# Patient Record
Sex: Male | Born: 1965 | Race: White | Hispanic: No
Health system: Midwestern US, Academic
[De-identification: ages and names within clinical notes are randomized; demographics above are authoritative.]

---

## 2017-04-28 ENCOUNTER — Ambulatory Visit: Admit: 2017-04-28 | Discharge: 2017-04-28 | Payer: Private Health Insurance - Indemnity

## 2017-04-28 ENCOUNTER — Encounter: Admit: 2017-04-28 | Discharge: 2017-04-28 | Payer: No Typology Code available for payment source

## 2017-04-28 DIAGNOSIS — Q613 Polycystic kidney, unspecified: Principal | ICD-10-CM

## 2017-04-28 DIAGNOSIS — N183 Chronic kidney disease, stage 3 (moderate): ICD-10-CM

## 2017-04-28 DIAGNOSIS — I151 Hypertension secondary to other renal disorders: ICD-10-CM

## 2017-04-28 LAB — COMPREHENSIVE METABOLIC PANEL
Lab: 0.5 mg/dL (ref 0.3–1.2)
Lab: 1.5 mg/dL — ABNORMAL HIGH (ref 0.4–1.24)
Lab: 10 mg/dL (ref 8.5–10.6)
Lab: 140 MMOL/L (ref 137–147)
Lab: 19 U/L (ref 7–40)
Lab: 19 U/L (ref 7–56)
Lab: 19 mg/dL (ref 7–25)
Lab: 23 MMOL/L (ref 21–30)
Lab: 4.3 g/dL (ref 3.5–5.0)
Lab: 4.4 MMOL/L (ref 3.5–5.1)
Lab: 49 U/L (ref 25–110)
Lab: 49 mL/min — ABNORMAL LOW (ref >60)
Lab: 59 mL/min — ABNORMAL LOW (ref >60)
Lab: 6 (ref 3–12)
Lab: 7.1 g/dL (ref 6.0–8.0)
Lab: 91 mg/dL (ref 70–100)

## 2017-04-28 LAB — PARATHYROID HORMONE: Lab: 137 pg/mL — ABNORMAL HIGH (ref 10–65)

## 2017-04-28 LAB — HEMOGLOBIN & HEMATOCRIT
Lab: 14 g/dL (ref 13.5–16.5)
Lab: 44 % (ref 40–50)

## 2017-04-28 LAB — PHOSPHORUS: Lab: 3.1 mg/dL (ref 2.0–4.0)

## 2017-04-28 NOTE — Progress Notes
[facility]  Progress Note    History       Name:  Matthew Paul                                             MRN:  [id number]   History of Present Illness: Matthew Paul is a 51 y.o. male here for evaluation of polycystic kidney disease.  Diagnosed 7 years ago by MRI He has been our tolvaptan clinical trial on 60/30 dose.  He was seeing Dr. Darene Lamer and would like to transfer his care to [place] and continue on tolvaptan. He has been in tolvaptan since 2015 and Cr has been stable around 1.3-1.5.      History of PKD complications:  ? History of hypertension: yes   ? History of UTI: no   ? History of kidney stones: no     ? History of liver cysts: no   ? History of subarachnoid hemorrhage: no          Past Medical and Surgical History  HTN   PKD   HLD   GERD     Review of Systems  Current symptoms of PKD:  ? Flank pain:  No   ? Back pain:   Some lower back pain   ? Abdominal pain: no   ? Macroscopic hematuria: no     ? Dysuria:   No     10 point ROS was obtained and otherwise negative except as mentioned above.    Medications  HOME MEDS  ??? ascorbic acid(+) (VITAMIN C) 1,000 mg tablet Take 1 tablet by mouth daily.   ??? calcium carbonate (CALCIUM 600 PO) Take 1 tablet by mouth daily.   ??? cholecalciferol (VITAMIN D-3) 400 unit tab tablet Take 400 Units by mouth daily.   ??? fexofenadine HCl (ALLEGRA PO) Take 1 tablet by mouth daily.   ??? FLAXSEED OIL PO Take 1,200 mg by mouth daily.   ??? lisinopril (PRINIVIL; ZESTRIL) 20 mg tablet Take 1 tablet by mouth daily.   ??? Magnesium 250 mg tab Take 1 tablet by mouth daily.   ??? pantoprazole DR (PROTONIX) 40 mg tablet Take 1 tablet by mouth daily.   ??? simvastatin (ZOCOR) 20 mg tablet Take 1 tablet by mouth daily.   ??? vitamin E 400 unit capsule Take 400 Units by mouth daily.   ??? vitamins, B complex tab Take 2 tablets by mouth daily.        Allergies    Seasonal allergies    Family History  ??? Family history of ADPKD, how it was diagnosed, age of onset of CKD/ESRD: Dad's twin sister and daughter   ??? Family history of brain aneurysm or cerebral hemorrhage: none       No family history on file.    Social History  ??? Amount of coffee intake (cups per day): none   ??? Smoking:  None   ??? Amount of tea intake (cups per day): none   ??? Amount of caffeinated soda intake (12 oz. cans per day): 3-4 cans/ week   ??? Average number of alcoholic drinks per day (1 drink = 1 glass of wine, 1 can of beer or 1 shot of spirits):  3-4 a week     Social History     Social History   ??? Marital status: Divorced  Spouse name: N/A   ??? Number of children: N/A   ??? Years of education: N/A     Social History Main Topics   ??? Smoking status: Never Smoker   ??? Smokeless tobacco: Never Used   ??? Alcohol use Not on file   ??? Drug use: Unknown   ??? Sexual activity: Not on file     Other Topics Concern   ??? Not on file     Social History Narrative   ??? No narrative on file            Physical Exam      Vitals:    04/28/17 1529 04/28/17 1530   BP: 116/79 108/78   Pulse: 82 96   Resp: 16    Temp: 36.8 ???C (98.3 ???F)    TempSrc: Oral    Weight: 131.3 kg (289 lb 6.4 oz)    Height: 200.7 cm (79)      Body mass index is 32.6 kg/m???.    Gen: Alert and Oriented, No Acute Distress   HEENT: Sclera normal, no cervical lymphadenopathy   CV:  S1 and S2 normal, no rubs, murmurs or gallops   Pulm: Clear to Auscultation bilateral   GI: BS+ x4, non-tender to palpation  ? Kidneys palpable: yes/non-tender   Neuro: Grossly normal, moving all extremities, speech intact  Ext: no edema, clubbing, or cyanosis  Skin: no rash       Labs     Comprehensive Metabolic Profile  CMP Latest Ref Rng & Units 04/28/2017 06/29/2013   NA 137 - 147 MMOL/L 140 140   K 3.5 - 5.1 MMOL/L 4.4 4.3   CL 98 - 110 MMOL/L 111(H) 108   CO2 21 - 30 MMOL/L 23 23   GAP 3 - 12 6 9    BUN 7 - 25 MG/DL 19 23   CR 0.4 - 1.61 MG/DL 0.96(E) 4.54(U)   GLUX 70 - 100 MG/DL 91 88   CA 8.5 - 98.1 MG/DL 19.1 47.8   TP 6.0 - 8.0 G/DL 7.1 -   ALB 3.5 - 5.0 G/DL 4.3 - ALKP 25 - 295 U/L 49 -   ALT 7 - 56 U/L 19 -   TBILI 0.3 - 1.2 MG/DL 0.5 -   GFR >62 mL/min 49(L) >60   GFRAA >60 mL/min 59(L) >60         Assessment and Plan        Matthew Paul is a 51 y.o. male     ADPKD with CKD 3  - ADPKD diagnosis is confirmed:  ? Approximate date of initial diagnosis: 2011  ? 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 Confirmed by imaging: MRI 2014  - will get CMP and plan to continue tolvaptan at 60mg /30mg  once he is done with the study   - baseline Cr 1.3-1.5   - discussed risk and benefit of tolvatpan with patient and the REMS program that he has to enroll in to get the medication     HTN  - controlled continue current regimen     Anemia   - hb at goal     Mineral bone disease   - PTH, phos and vitamin D at goal 04/2017    [name], MD 05/04/2017 1:27 PM  Pager         Patient Instructions   Please do not hesitate to call with any questions.  My nurse Dahlia Client can be reached [phone].  Please go to the lab on your way out     Return to clinic in 3 months       Jynarque (Tolvaptan)   Risk   There is risk of serious and potentially fatal liver injury associated with JYNARQUE and that blood testing and monitoring is required to take this medication.  Only 0.2% of patients in clinical trials had increase in their liver enzymes     There is risk of hypernatremia or elevated sodium in your blood if Paul did not drink enough water. Please see fluid intake instructions     Blood work Instructions   ??? Get a blood test at 2 weeks and 4 weeks after Paul start treatment.   ??? Get a blood test every month for the first 18 months of treatment and then every 3 months     Fluid Intake Instructions   Please drink enough fluid to prevent excessive thirst throughout the daytime period and an additional 1-2 cups of water before bedtime with additional replenishment with each episode of nighttime urination to prevent dehydration Please call if Paul developed fatigue, anorexia, nausea, right upper abdominal discomfort, vomiting, fever, rash, pruritus, icterus, dark urine, or jaundice. These could be signs of liver injury     To start the medication Paul will need to complete the patient enrollment form

## 2017-04-29 LAB — 25-OH VITAMIN D (D2 + D3): Lab: 48 ng/mL — ABNORMAL HIGH (ref 30–80)

## 2017-05-04 ENCOUNTER — Encounter: Admit: 2017-05-04 | Discharge: 2017-05-04 | Payer: No Typology Code available for payment source

## 2017-05-04 NOTE — Telephone Encounter
-----   Message from Earlie Raveling, MD sent at 05/03/2017 12:40 PM CDT -----  Please let him know his liver function tests are normal sent in the REMS for him to get tolvaptan once he has filled out the my pass I will send the script into the pharmacy   Thanks  [facility]

## 2017-05-04 NOTE — Telephone Encounter
Called pt.   LVM to call back.

## 2017-05-10 ENCOUNTER — Encounter: Admit: 2017-05-10 | Discharge: 2017-05-10 | Payer: No Typology Code available for payment source

## 2017-05-10 MED ORDER — TOLVAPTAN 60 MG (AM)/ 30 MG (PM) PO TBSQ
60 mg | ORAL_TABLET | Freq: Two times a day (BID) | ORAL | 11 refills | Status: AC
Start: 2017-05-10 — End: 2018-06-06

## 2017-05-11 ENCOUNTER — Encounter: Admit: 2017-05-11 | Discharge: 2017-05-11 | Payer: No Typology Code available for payment source

## 2017-05-11 NOTE — Progress Notes
[facility] called, they are needing OV note and labs in order to initiate prior authorization. Information faxed to [phone].

## 2017-05-19 NOTE — Telephone Encounter
Pt aware of below, REMs completed and faxed. No further action needed.

## 2017-05-25 ENCOUNTER — Encounter: Admit: 2017-05-25 | Discharge: 2017-05-25 | Payer: No Typology Code available for payment source

## 2017-05-25 DIAGNOSIS — Q613 Polycystic kidney, unspecified: Principal | ICD-10-CM

## 2017-05-25 NOTE — Telephone Encounter
Per Dr. Cephas Darby, pt to have labs Q23mo for [facility] drug monitoring. Standing order for BMP placed and mailed along with [facility] form for LFTs.

## 2017-06-04 LAB — COMPREHENSIVE METABOLIC PANEL
Lab: 15
Lab: 15
Lab: 57

## 2017-07-05 ENCOUNTER — Encounter: Admit: 2017-07-05 | Discharge: 2017-07-05 | Payer: No Typology Code available for payment source

## 2017-07-05 DIAGNOSIS — Q613 Polycystic kidney, unspecified: Principal | ICD-10-CM

## 2017-07-08 ENCOUNTER — Encounter: Admit: 2017-07-08 | Discharge: 2017-07-08 | Payer: No Typology Code available for payment source

## 2017-07-08 NOTE — Telephone Encounter
Nurse attempted to call patient to discuss his lab results however no one answered.    Nurse will try again later on,   Carlos American, LPN

## 2017-07-08 NOTE — Telephone Encounter
Nurse spoke with patient and informed him that Dr. Cephas Darby stated, "Please let him know his liver function test are normal; can continue on tolvaptan."    Patient verbalized understanding and had no questions or concerns.   Matthew American, LPN

## 2017-08-04 ENCOUNTER — Ambulatory Visit: Admit: 2017-08-04 | Discharge: 2017-08-04 | Payer: Private Health Insurance - Indemnity

## 2017-08-04 ENCOUNTER — Encounter: Admit: 2017-08-04 | Discharge: 2017-08-04 | Payer: No Typology Code available for payment source

## 2017-08-04 DIAGNOSIS — I151 Hypertension secondary to other renal disorders: ICD-10-CM

## 2017-08-04 DIAGNOSIS — N183 Chronic kidney disease, stage 3 (moderate): Principal | ICD-10-CM

## 2017-08-04 DIAGNOSIS — Q613 Polycystic kidney, unspecified: ICD-10-CM

## 2017-08-04 NOTE — Progress Notes
History          CC: PKD with CKD     HPI: Matthew Paul is a 51 y.o. male here for follow up on his PKD.  He continues on tolvaptan 60mg /30mg  that he was on in the study and doing well with that dose.  He denies any chest pain, nausea, vomiting, diarrhea, problems breathing or edema.      Medication    ??? ascorbic acid(+) (VITAMIN C) 1,000 mg tablet Take 1 tablet by mouth daily.   ??? calcium carbonate (CALCIUM 600 PO) Take 1 tablet by mouth daily.   ??? cholecalciferol (VITAMIN D-3) 400 unit tab tablet Take 400 Units by mouth daily.   ??? fexofenadine HCl (ALLEGRA PO) Take 1 tablet by mouth daily.   ??? FLAXSEED OIL PO Take 1,200 mg by mouth daily.   ??? lisinopril (PRINIVIL; ZESTRIL) 20 mg tablet Take 1 tablet by mouth daily.   ??? Magnesium 250 mg tab Take 1 tablet by mouth daily.   ??? pantoprazole DR (PROTONIX) 40 mg tablet Take 1 tablet by mouth daily.   ??? simvastatin (ZOCOR) 20 mg tablet Take 1 tablet by mouth daily.   ??? tolvaptan 60 mg (AM)/ 30 mg (PM) TbSQ Take 60 mg by mouth twice daily. Please take 60mg  every morning and 30 mg in the evening. Disregard 60 BID dosing instructions   ??? vitamin E 400 unit capsule Take 400 Units by mouth daily.   ??? vitamins, B complex tab Take 2 tablets by mouth daily.          Review of Systems  Constitutional: negative  Eyes: negative  Ears, nose, mouth, throat, and face: negative  Respiratory: negative  Cardiovascular: negative  Gastrointestinal: negative  Genitourinary:negative  Integument/breast: negative  Hematologic/lymphatic: negative  Musculoskeletal:negative  Neurological: negative  Endocrine: negative      Physical        Vitals:    08/04/17 1536 08/04/17 1538   BP: 128/89 118/89   Pulse: 85 101   Weight: 135.6 kg (299 lb)    Height: 200.7 cm (79)      Body mass index is 33.68 kg/m???.    Gen: Alert and Oriented, No Acute Distress   HEENT: Sclera normal; MMM    CV: no JVD, S1 and S2 normal, no rubs, murmurs or gallops   Pulm: Clear to Auscultation bilateral GI: BS+ x4, non-tender to palpation  Neuro: Grossly normal, moving all extremities, speech intact  Ext: no edema, clubbing or cyanosis   Skin: no rash      Assessment and Plan         Matthew Paul is a 51 y.o. 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 Approximate date of initial diagnosis: 2011  ? 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 Confirmed by imaging: MRI 2014  - continue tolvaptan at 60mg /30mg   - baseline Cr 1.3-1.5   - doing well on tolvaptan due for 3 months labs in November, will continue to monitor LFTs and for side effects     HTN  - controlled continue current regimen     Anemia   - hb at goal     Mineral bone disease   - PTH, phos and vitamin D at goal 04/2017          [name], MD   Pager [phone]  Patient Instructions  Please do not hesitate to call with any questions.  My nurse Jarrett Soho can be reached [phone].     Please go to the lab every 3 months     Return to clinic in 6 months

## 2017-08-08 ENCOUNTER — Encounter: Admit: 2017-08-08 | Discharge: 2017-08-08 | Payer: No Typology Code available for payment source

## 2017-09-12 ENCOUNTER — Encounter: Admit: 2017-09-12 | Discharge: 2017-09-12 | Payer: No Typology Code available for payment source

## 2018-01-19 ENCOUNTER — Encounter: Admit: 2018-01-19 | Discharge: 2018-01-19 | Payer: No Typology Code available for payment source

## 2018-01-20 LAB — BASIC METABOLIC PANEL
Lab: 1.4 — ABNORMAL HIGH
Lab: 106
Lab: 143
Lab: 22
Lab: 23
Lab: 4.7
Lab: 53 — ABNORMAL LOW
Lab: 92

## 2018-01-20 LAB — LIVER FUNCTION PANEL
Lab: 0.4
Lab: 14
Lab: 17
Lab: 61

## 2018-01-24 ENCOUNTER — Encounter: Admit: 2018-01-24 | Discharge: 2018-01-24 | Payer: No Typology Code available for payment source

## 2018-01-24 DIAGNOSIS — N183 Chronic kidney disease, stage 3 (moderate): Principal | ICD-10-CM

## 2018-03-16 ENCOUNTER — Encounter: Admit: 2018-03-16 | Discharge: 2018-03-16 | Payer: No Typology Code available for payment source

## 2018-03-16 ENCOUNTER — Ambulatory Visit: Admit: 2018-03-16 | Discharge: 2018-03-16 | Payer: Private Health Insurance - Indemnity

## 2018-03-16 DIAGNOSIS — N183 Chronic kidney disease, stage 3 (moderate): Principal | ICD-10-CM

## 2018-03-16 DIAGNOSIS — Q613 Polycystic kidney, unspecified: ICD-10-CM

## 2018-03-16 DIAGNOSIS — I151 Hypertension secondary to other renal disorders: ICD-10-CM

## 2018-03-20 ENCOUNTER — Encounter: Admit: 2018-03-20 | Discharge: 2018-03-20 | Payer: No Typology Code available for payment source

## 2018-05-29 ENCOUNTER — Encounter: Admit: 2018-05-29 | Discharge: 2018-05-29 | Payer: No Typology Code available for payment source

## 2018-06-01 MED ORDER — JYNARQUE 60 MG (AM)/ 30 MG (PM) PO TBSQ
ORAL_TABLET | 8 refills
Start: 2018-06-01 — End: ?

## 2018-06-05 ENCOUNTER — Encounter: Admit: 2018-06-05 | Discharge: 2018-06-05 | Payer: No Typology Code available for payment source

## 2018-06-06 ENCOUNTER — Encounter: Admit: 2018-06-06 | Discharge: 2018-06-06 | Payer: No Typology Code available for payment source

## 2018-06-06 MED ORDER — TOLVAPTAN 60 MG (AM)/ 30 MG (PM) PO TBSQ
60 mg | ORAL_TABLET | Freq: Two times a day (BID) | ORAL | 5 refills | 28.00000 days | Status: AC
Start: 2018-06-06 — End: 2018-11-22

## 2018-06-08 ENCOUNTER — Encounter: Admit: 2018-06-08 | Discharge: 2018-06-08 | Payer: No Typology Code available for payment source

## 2018-06-09 ENCOUNTER — Encounter: Admit: 2018-06-09 | Discharge: 2018-06-09 | Payer: No Typology Code available for payment source

## 2018-09-15 ENCOUNTER — Encounter: Admit: 2018-09-15 | Discharge: 2018-09-15 | Payer: No Typology Code available for payment source

## 2018-09-25 LAB — LIVER FUNCTION PANEL
Lab: 0.4
Lab: 16
Lab: 18
Lab: 64

## 2018-09-29 ENCOUNTER — Encounter: Admit: 2018-09-29 | Discharge: 2018-09-29 | Payer: No Typology Code available for payment source

## 2018-10-23 ENCOUNTER — Encounter: Admit: 2018-10-23 | Discharge: 2018-10-23 | Payer: 59

## 2018-10-24 ENCOUNTER — Encounter: Admit: 2018-10-24 | Discharge: 2018-10-24 | Payer: 59

## 2018-10-24 DIAGNOSIS — Q613 Polycystic kidney, unspecified: Secondary | ICD-10-CM

## 2018-10-24 DIAGNOSIS — N189 Chronic kidney disease, unspecified: Secondary | ICD-10-CM

## 2018-10-25 ENCOUNTER — Encounter: Admit: 2018-10-25 | Discharge: 2018-10-25 | Payer: 59

## 2018-10-26 ENCOUNTER — Ambulatory Visit: Admit: 2018-10-26 | Discharge: 2018-10-27 | Payer: No Typology Code available for payment source

## 2018-10-26 ENCOUNTER — Encounter: Admit: 2018-10-26 | Discharge: 2018-10-26 | Payer: 59

## 2018-10-26 ENCOUNTER — Ambulatory Visit: Admit: 2018-10-26 | Discharge: 2018-10-26

## 2018-10-26 DIAGNOSIS — N183 Chronic kidney disease, stage 3 (moderate): Secondary | ICD-10-CM

## 2018-10-26 DIAGNOSIS — Q613 Polycystic kidney, unspecified: Secondary | ICD-10-CM

## 2018-10-26 DIAGNOSIS — N189 Chronic kidney disease, unspecified: Principal | ICD-10-CM

## 2018-10-26 NOTE — Progress Notes
History          CC: PKD with CKD     HPI: Matthew Paul is a 53 y.o. male here for follow up on his PKD. He has been doing well since his last visit on Tovlaptan.  He had his cysts drain 2 years ago with good symptomatic improvement.  He noticed the shortness of breath and so he went back to [facility] in Sept 2019 for cysts drained.  They did not drain any of the large cysts and he is still having major symptoms.  He has worsening abdominal distention, shortness of breath and flank pain.       Medication    ? ascorbic acid(+) (VITAMIN C) 1,000 mg tablet Take 1 tablet by mouth daily.   ? calcium carbonate (CALCIUM 600 PO) Take 1 tablet by mouth daily.   ? cholecalciferol (VITAMIN D-3) 400 unit tab tablet Take 400 Units by mouth daily.   ? fexofenadine HCl (ALLEGRA PO) Take 1 tablet by mouth daily.   ? FLAXSEED OIL PO Take 1,200 mg by mouth daily.   ? lisinopril (PRINIVIL; ZESTRIL) 20 mg tablet Take 1 tablet by mouth daily.   ? Magnesium 250 mg tab Take 1 tablet by mouth daily.   ? pantoprazole DR (PROTONIX) 40 mg tablet Take 1 tablet by mouth daily.   ? simvastatin (ZOCOR) 20 mg tablet Take 1 tablet by mouth daily.   ? tolvaptan 60 mg (AM)/ 30 mg (PM) TbSQ Take 60 mg by mouth twice daily. Take 60mg  in the AM and 30mg  in the PM  Indications: Autosomal Dominant Polycystic Kidney Disease   ? vitamin E 400 unit capsule Take 400 Units by mouth daily.   ? vitamins, B complex tab Take 2 tablets by mouth daily.          Review of Systems  Constitutional: negative  Eyes: negative  Ears, nose, mouth, throat, and face: negative  Respiratory: shortness of breath   Cardiovascular: negative  Gastrointestinal:abdominal distention   Genitourinary:polyuria   Integument/breast: negative  Hematologic/lymphatic: negative  Musculoskeletal:negative  Neurological: negative  Endocrine: negative      Physical        Vitals:    10/26/18 1301 10/26/18 1307   BP: 133/85 111/79   Pulse: 97 118   Temp: 36.6 ?C (97.9 ?F)    TempSrc: Oral Weight: 132.4 kg (291 lb 12.8 oz)    Height: 200.7 cm (79)      Body mass index is 32.87 kg/m???.    Gen: Alert and Oriented, No Acute Distress   HEENT: Sclera normal; MMM    CV: no JVD, S1 and S2 normal, no rubs, murmurs or gallops   Pulm: Clear to Auscultation bilateral   GI: BS+ x4, non-tender to palpation  Neuro: Grossly normal, moving all extremities, speech intact  Ext: no edema, clubbing or cyanosis   Skin: no rash      Assessment and Plan         Matthew Paul is a 53 y.o. 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 Approximate date of initial diagnosis: 2011  ? 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 Confirmed by imaging: MRI 2014  - continue tolvaptan at 60mg /30mg   - baseline Cr 1.5   - Cr on labs done today is up slightly to 1.8   - doing well on tolvaptan dose, will  continue to monitor LFTs and for side effects   - arrange for IR to do cyst aspiration here     HTN  - controlled continue current regimen     Anemia   - hb at goal     Mineral bone disease   - PTH, phos and vitamin D at goal 04/2017    RTC 6 months   [name], MD   Pager [phone]

## 2018-10-27 DIAGNOSIS — N183 Chronic kidney disease, stage 3 (moderate): Secondary | ICD-10-CM

## 2018-10-31 ENCOUNTER — Encounter: Admit: 2018-10-31 | Discharge: 2018-10-31 | Payer: 59

## 2018-11-01 ENCOUNTER — Ambulatory Visit: Admit: 2018-11-01 | Discharge: 2018-11-01 | Payer: No Typology Code available for payment source

## 2018-11-01 ENCOUNTER — Encounter: Admit: 2018-11-01 | Discharge: 2018-11-01 | Payer: 59

## 2018-11-01 DIAGNOSIS — N183 Chronic kidney disease, stage 3 (moderate): Secondary | ICD-10-CM

## 2018-11-01 DIAGNOSIS — Q612 Polycystic kidney, adult type: Secondary | ICD-10-CM

## 2018-11-01 DIAGNOSIS — N189 Chronic kidney disease, unspecified: ICD-10-CM

## 2018-11-01 MED ORDER — FENTANYL CITRATE (PF) 50 MCG/ML IJ SOLN
25-50 ug | Freq: Once | INTRAVENOUS | 0 refills | Status: CP
Start: 2018-11-01 — End: ?
  Administered 2018-11-01: 18:00:00 50 ug via INTRAVENOUS

## 2018-11-01 MED ORDER — MIDAZOLAM 1 MG/ML IJ SOLN
0 refills | Status: CP
Start: 2018-11-01 — End: ?
  Administered 2018-11-01: 19:00:00 1 mg via INTRAVENOUS

## 2018-11-01 MED ORDER — FENTANYL CITRATE (PF) 50 MCG/ML IJ SOLN
0 refills | Status: CP
Start: 2018-11-01 — End: ?
  Administered 2018-11-01 (×2): 50 ug via INTRAVENOUS

## 2018-11-01 MED ORDER — MIDAZOLAM 1 MG/ML IJ SOLN
1 mg | Freq: Once | INTRAVENOUS | 0 refills | Status: CP
Start: 2018-11-01 — End: ?
  Administered 2018-11-01: 18:00:00 1 mg via INTRAVENOUS

## 2018-11-03 ENCOUNTER — Encounter: Admit: 2018-11-03 | Discharge: 2018-11-03 | Payer: 59

## 2018-11-03 DIAGNOSIS — Q613 Polycystic kidney, unspecified: Secondary | ICD-10-CM

## 2018-11-07 MED ORDER — FENTANYL CITRATE (PF) 50 MCG/ML IJ SOLN
25-50 ug | Freq: Once | INTRAVENOUS | 0 refills | Status: DC
Start: 2018-11-07 — End: 2018-11-07

## 2018-11-07 MED ORDER — SODIUM CHLORIDE 0.9 % IV SOLP
1000 mL | Freq: Once | INTRAVENOUS | 0 refills | Status: DC
Start: 2018-11-07 — End: 2018-11-07

## 2018-11-07 MED ORDER — FLUMAZENIL 0.1 MG/ML IV SOLN
.2 mg | INTRAVENOUS | 0 refills | Status: DC | PRN
Start: 2018-11-07 — End: 2018-11-07

## 2018-11-07 MED ORDER — MIDAZOLAM 1 MG/ML IJ SOLN
1 mg | Freq: Once | INTRAVENOUS | 0 refills | Status: DC
Start: 2018-11-07 — End: 2018-11-07

## 2018-11-07 MED ORDER — NALOXONE 0.4 MG/ML IJ SOLN
.08 mg | INTRAVENOUS | 0 refills | Status: DC | PRN
Start: 2018-11-07 — End: 2018-11-07

## 2018-11-08 ENCOUNTER — Ambulatory Visit: Admit: 2018-11-08 | Discharge: 2018-11-08 | Payer: No Typology Code available for payment source

## 2018-11-08 DIAGNOSIS — Q613 Polycystic kidney, unspecified: Secondary | ICD-10-CM

## 2018-11-08 LAB — BASIC METABOLIC PANEL
Lab: 109 MMOL/L — ABNORMAL LOW (ref 98–110)
Lab: 141 MMOL/L — ABNORMAL HIGH (ref 137–147)
Lab: 23 MMOL/L — ABNORMAL LOW (ref 21–30)
Lab: 4 MMOL/L — CL (ref 3.5–5.1)
Lab: 46 mL/min — ABNORMAL LOW (ref >60)
Lab: 56 mL/min — ABNORMAL LOW (ref >60)
Lab: 9 g/dL — ABNORMAL LOW (ref 3–12)
Lab: 9.8 mg/dL — ABNORMAL HIGH (ref 8.5–10.6)
Lab: 94 mg/dL — ABNORMAL HIGH (ref 70–100)

## 2018-11-08 MED ORDER — MIDAZOLAM 1 MG/ML IJ SOLN
1 mg | Freq: Once | INTRAVENOUS | 0 refills | Status: CP
Start: 2018-11-08 — End: ?
  Administered 2018-11-08: 18:00:00 1 mg via INTRAVENOUS

## 2018-11-08 MED ORDER — FENTANYL CITRATE (PF) 50 MCG/ML IJ SOLN
0 refills | Status: CP
Start: 2018-11-08 — End: ?
  Administered 2018-11-08 (×2): 50 ug via INTRAVENOUS

## 2018-11-08 MED ORDER — MIDAZOLAM 1 MG/ML IJ SOLN
0 refills | Status: CP
Start: 2018-11-08 — End: ?
  Administered 2018-11-08 (×2): 1 mg via INTRAVENOUS

## 2018-11-08 MED ORDER — FENTANYL CITRATE (PF) 50 MCG/ML IJ SOLN
50 ug | Freq: Once | INTRAVENOUS | 0 refills | Status: CP
Start: 2018-11-08 — End: ?
  Administered 2018-11-08: 18:00:00 50 ug via INTRAVENOUS

## 2018-11-22 ENCOUNTER — Encounter: Admit: 2018-11-22 | Discharge: 2018-11-22 | Payer: No Typology Code available for payment source

## 2018-11-22 MED ORDER — TOLVAPTAN 60 MG (AM)/ 30 MG (PM) PO TBSQ
60 mg | ORAL_TABLET | Freq: Two times a day (BID) | ORAL | 5 refills | 28.00000 days | Status: AC
Start: 2018-11-22 — End: 2019-05-29

## 2018-11-23 ENCOUNTER — Encounter: Admit: 2018-11-23 | Discharge: 2018-11-23 | Payer: No Typology Code available for payment source

## 2018-12-04 ENCOUNTER — Encounter: Admit: 2018-12-04 | Discharge: 2018-12-04 | Payer: No Typology Code available for payment source

## 2018-12-04 NOTE — Telephone Encounter
Pt called wanted to f/u on prior auth for Jynarque. Advised pt have rec'd approval for it via CoverMyMeds through his new plan. Pt states he just talked to his insurance and they reported they have not received anything. Advised pt will have to call and see what is going on as I do have an approval for this. Pt v/u, provided his member ID # [id number]. Advised will f/u with him once I clear this up with [employer], # is [phone].

## 2019-02-02 LAB — LIVER FUNCTION PANEL
Lab: 0.7
Lab: 20
Lab: 21
Lab: 66

## 2019-02-05 ENCOUNTER — Encounter: Admit: 2019-02-05 | Discharge: 2019-02-05 | Payer: No Typology Code available for payment source

## 2019-03-01 ENCOUNTER — Encounter: Admit: 2019-03-01 | Discharge: 2019-03-01 | Payer: No Typology Code available for payment source

## 2019-03-01 ENCOUNTER — Ambulatory Visit: Admit: 2019-03-01 | Discharge: 2019-03-01 | Payer: No Typology Code available for payment source

## 2019-03-01 DIAGNOSIS — Q613 Polycystic kidney, unspecified: Principal | ICD-10-CM

## 2019-03-01 NOTE — Progress Notes
Renal Clinic TeleHealth Note     Obtained patient's verbal consent to treat them and their agreement to [facility] financial policy and NPP via this telehealth visit during the Coronavirus Public Health Emergency      History      CC:     HPI: Matthew Paul is a 53 y.o. male  Seen in clinic for follow upon his PKD.  He had his cyst drained through IR and did not get as much relief has he has had in the past with the drainage.  He continues to have back pain and abdominal discomfort.  He denies any problems breathing, nausea, vomiting or diarrhea.        Past Medical History   PKD   HTN     Family History  Family history reviewed and non contributory     Social History  Social history reviewed and non contributory     Medication    HOME MEDS  ??? ascorbic acid(+) (VITAMIN C) 1,000 mg tablet Take 1 tablet by mouth daily.   ??? calcium carbonate (CALCIUM 600 PO) Take 1 tablet by mouth daily.   ??? cholecalciferol (VITAMIN D-3) 400 unit tab tablet Take 400 Units by mouth daily.   ??? fexofenadine HCl (ALLEGRA PO) Take 1 tablet by mouth daily.   ??? FLAXSEED OIL PO Take 1,200 mg by mouth daily.   ??? lisinopril (PRINIVIL; ZESTRIL) 20 mg tablet Take 1 tablet by mouth daily.   ??? Magnesium 250 mg tab Take 1 tablet by mouth daily.   ??? pantoprazole DR (PROTONIX) 40 mg tablet Take 1 tablet by mouth daily.   ??? simvastatin (ZOCOR) 20 mg tablet Take 1 tablet by mouth daily.   ??? tolvaptan 60 mg (AM)/ 30 mg (PM) TbSQ Take 60 mg by mouth twice daily. Take 60mg  in the AM and 30mg  in the PM  Indications: autosomal dominant polycystic kidney disease   ??? vitamin E 400 unit capsule Take 400 Units by mouth daily.   ??? vitamins, B complex tab Take 2 tablets by mouth daily.          Review of Systems  Constitutional: negative  Eyes: negative  Ears, nose, mouth, throat, and face: negative  Respiratory: negative  Cardiovascular: negative  Gastrointestinal: negative  Genitourinary:negative  Integument/breast: negative  Hematologic/lymphatic: negative

## 2019-03-06 ENCOUNTER — Encounter: Admit: 2019-03-06 | Discharge: 2019-03-06 | Payer: No Typology Code available for payment source

## 2019-03-13 ENCOUNTER — Ambulatory Visit: Admit: 2019-03-13 | Discharge: 2019-03-13

## 2019-03-13 ENCOUNTER — Encounter: Admit: 2019-03-13 | Discharge: 2019-03-13

## 2019-03-13 DIAGNOSIS — Q613 Polycystic kidney, unspecified: Secondary | ICD-10-CM

## 2019-03-13 NOTE — Telephone Encounter
Rec'd call from pt. Had MRI done today. Reports that prior authorization/pre approval was done through his old insurance provider that he hasn't had since 10/2018. His insurance info is updated and correct in [employer] and has been for awhile so unsure why was sent to different insurance. Advised will call his current insurance provider and complete prior auth for this. Pt v/u with no further questions or concerns.

## 2019-03-21 ENCOUNTER — Encounter: Admit: 2019-03-21 | Discharge: 2019-03-21

## 2019-03-21 NOTE — Telephone Encounter
Sent pt message via MyChart.

## 2019-03-21 NOTE — Telephone Encounter
-----   Message from Idolina Primer, MD sent at 03/21/2019  1:42 PM CDT -----  I am working on it.  Unfortunately a lot of his large cysts are not on the outside of his kidney and not easy to get to.  I am going to run his case by [employer] to see if he has any options   Thanks  Kerri   ----- Message -----  From: Syliva Overman, LPN  Sent: 05/18/8675   1:11 PM CDT  To: Idolina Primer, MD    Pt was wondering about Urologist? He said you were going to talk with someone about this MRI and abdominal fullness etc.

## 2019-04-06 ENCOUNTER — Encounter: Admit: 2019-04-06 | Discharge: 2019-04-06

## 2019-04-06 NOTE — Telephone Encounter
Nurse had been speaking with patients insurance company for the past 2 weeks to get them to pay for patients MRI.    Nurse spoke with someone from [employer] today who stated that the claim was paid on 6/18-2020.    The [employer] rep gave the nurse the check number which is, [id number], and the reference number for toadys call is [id number], in case anyone needed proof that claim was already paid.    Carlos American, RN

## 2019-04-09 ENCOUNTER — Encounter: Admit: 2019-04-09 | Discharge: 2019-04-09

## 2019-04-19 ENCOUNTER — Encounter: Admit: 2019-04-19 | Discharge: 2019-04-19

## 2019-04-19 DIAGNOSIS — Q613 Polycystic kidney, unspecified: Secondary | ICD-10-CM

## 2019-04-19 NOTE — Telephone Encounter
Dr. Dondra Prader reviewed pt MRI. If pt wanting evaluated can refer to Dr. Morene Crocker in Urology. Spoke with pt and discussed. Pt agreeable to referral. Pt had no further questions or concerns by end of call.

## 2019-04-30 ENCOUNTER — Encounter: Admit: 2019-04-30 | Discharge: 2019-04-30

## 2019-04-30 NOTE — Telephone Encounter
Nurse received a voicemail from patient wanting to know if the referral for Urology was done because he still had not received a call from them to schedule an appointment.     Nurse spoke with the Urology department and confirmed that they did received the referral but they haven't had a chance to schedule him yet due to being a little backed up.  She did state that they would be calling him either this week or next week.    Nurse spoke with patient and informed him of the statement above.    Patient verbalized understanding and had no further questions or concerns.    Carlos American, RN

## 2019-05-21 ENCOUNTER — Ambulatory Visit: Admit: 2019-05-21 | Discharge: 2019-05-22

## 2019-05-21 ENCOUNTER — Encounter: Admit: 2019-05-21 | Discharge: 2019-05-21

## 2019-05-21 DIAGNOSIS — Q612 Polycystic kidney, adult type: Secondary | ICD-10-CM

## 2019-05-21 DIAGNOSIS — Z1159 Encounter for screening for other viral diseases: Secondary | ICD-10-CM

## 2019-05-21 MED ORDER — CEFAZOLIN INJ 1GM IVP
2 g | Freq: Once | INTRAVENOUS | 0 refills | Status: CN
Start: 2019-05-21 — End: ?

## 2019-05-21 NOTE — Progress Notes
Date of Service: 05/21/2019     Subjective:             Matthew Paul is a 53 y.o. male who is referred by Dr. Catarina Hartshorn for possible cyst decortication of polycystic kidneys    History of Present Illness  Matthew Paul is a 53 y.o. male with a history of hypertension, anemia, paroxsymal a-fib (no AC) and ADPKD with CKD 3 who presents today for evaluation of a alternative cyst drainage.  He has been diagnosed with ADPKD for about 8 years. It comes from his paternal side as his aunt required dialysis and nephrectomies for this in the past. He is currently on Tolvaptan drug study, which is now approved by the FDA. Due to this medication, he has significant nocturia and urinary frequency. His creatinine 1.57. His symptoms include mild shortness or breath, unable to sleep on his back and abdominal fullness. He does have leg swelling late in the day. His abdomen is uncomfortabortabe, but not painful. He has undergone numerous cyst drainage in the past at [facility]. At one point, they drained 6 liters of fluid. Here at [facility] underwent successful IR aspiration and drainage of cyst on 11/01/2018 and 11/07/2018. He has completed approximately 6 cyst drainage's in the past which last typically about 2 years. Unfortunately, his most recent drainage did not relieve his symptoms for a long amount of time.     Cyst drainage at [facility]  11/01/2018: Successful aspiration of the dominant 4-5 cysts in the bilateral kidneys from the anterior approach  11/07/2018: Successful ultrasound-guided bilateral renal cyst aspiration. A total of 1 L of cyst fluid was aspirated.    He had an right inguinal hernia repair in 1985, no mesh. No GU malignancies. Never smoker. Former heavy drinking. Lives in [place]. Works as an Midwife.     History reviewed. No pertinent past medical history.    History reviewed. No pertinent surgical history.    History reviewed. No pertinent family history. Current Outpatient Medications   Medication Sig Dispense Refill   ??? ascorbic acid(+) (VITAMIN C) 1,000 mg tablet Take 1 tablet by mouth daily.     ??? atorvastatin (LIPITOR) 20 mg tablet Take 20 mg by mouth daily.     ??? calcium carbonate (CALCIUM 600 PO) Take 1 tablet by mouth daily.     ??? cholecalciferol (VITAMIN D-3) 400 unit tab tablet Take 400 Units by mouth daily.     ??? fexofenadine HCl (ALLEGRA PO) Take 1 tablet by mouth daily.     ??? FLAXSEED OIL PO Take 1,200 mg by mouth daily.     ??? lisinopril (PRINIVIL; ZESTRIL) 20 mg tablet Take 1 tablet by mouth daily.     ??? Magnesium 250 mg tab Take 1 tablet by mouth daily.     ??? omeprazole DR (PRILOSEC) 40 mg capsule Take 40 mg by mouth daily.     ??? tolvaptan 60 mg (AM)/ 30 mg (PM) TbSQ Take 60 mg by mouth twice daily. Take 60mg  in the AM and 30mg  in the PM  Indications: autosomal dominant polycystic kidney disease 60 tablet 5   ??? vitamin E 400 unit capsule Take 400 Units by mouth daily.     ??? vitamins, B complex tab Take 2 tablets by mouth daily.       No current facility-administered medications for this visit.        Allergies   Allergen Reactions   ??? Seasonal Allergies SNEEZING  Social History     Socioeconomic History   ??? Marital status: Divorced     Spouse name: Not on file   ??? Number of children: Not on file   ??? Years of education: Not on file   ??? Highest education level: Not on file   Occupational History   ??? Not on file   Tobacco Use   ??? Smoking status: Never Smoker   ??? Smokeless tobacco: Never Used   Substance and Sexual Activity   ??? Alcohol use: Not on file   ??? Drug use: Not on file   ??? Sexual activity: Not on file   Other Topics Concern   ??? Not on file   Social History Narrative   ??? Not on file       Review of Systems   Constitutional: Negative for activity change, appetite change, chills, diaphoresis, fatigue, fever and unexpected weight change.   HENT: Negative for congestion, hearing loss, mouth sores and sinus pressure. Eyes: Negative for visual disturbance.   Respiratory: Positive for apnea. Negative for cough, chest tightness and shortness of breath.    Cardiovascular: Positive for leg swelling. Negative for chest pain and palpitations.   Gastrointestinal: Positive for abdominal pain. Negative for blood in stool, constipation, diarrhea, nausea, rectal pain and vomiting.   Genitourinary: Negative for decreased urine volume, difficulty urinating, discharge, dysuria, enuresis, flank pain, frequency, genital sores, hematuria, penile pain, penile swelling, scrotal swelling, testicular pain and urgency.   Musculoskeletal: Negative for arthralgias, back pain, gait problem and myalgias.   Skin: Negative for rash and wound.   Neurological: Negative for dizziness, tremors, seizures, weakness, light-headedness, numbness and headaches.   Hematological: Negative for adenopathy. Does not bruise/bleed easily.   Psychiatric/Behavioral: Negative for behavioral problems. The patient is not nervous/anxious.        Objective:         ??? ascorbic acid(+) (VITAMIN C) 1,000 mg tablet Take 1 tablet by mouth daily.   ??? atorvastatin (LIPITOR) 20 mg tablet Take 20 mg by mouth daily.   ??? calcium carbonate (CALCIUM 600 PO) Take 1 tablet by mouth daily.   ??? cholecalciferol (VITAMIN D-3) 400 unit tab tablet Take 400 Units by mouth daily.   ??? fexofenadine HCl (ALLEGRA PO) Take 1 tablet by mouth daily.   ??? FLAXSEED OIL PO Take 1,200 mg by mouth daily.   ??? lisinopril (PRINIVIL; ZESTRIL) 20 mg tablet Take 1 tablet by mouth daily.   ??? Magnesium 250 mg tab Take 1 tablet by mouth daily.   ??? omeprazole DR (PRILOSEC) 40 mg capsule Take 40 mg by mouth daily.   ??? tolvaptan 60 mg (AM)/ 30 mg (PM) TbSQ Take 60 mg by mouth twice daily. Take 60mg  in the AM and 30mg  in the PM  Indications: autosomal dominant polycystic kidney disease   ??? vitamin E 400 unit capsule Take 400 Units by mouth daily.   ??? vitamins, B complex tab Take 2 tablets by mouth daily.     Vitals: 05/21/19 0803   BP: 118/73   BP Source: Arm, Left Upper   Patient Position: Sitting   Pulse: 73   Weight: 131.5 kg (290 lb)   Height: 200.7 cm (79)   PainSc: Zero     Body mass index is 32.67 kg/m???.     Physical Exam   Constitutional: He appears well-developed and well-nourished.   HENT:   Head: Normocephalic and atraumatic.   Eyes: Pupils are equal, round, and reactive to light. Conjunctivae are  normal. No scleral icterus.   Neck: Neck supple.   Cardiovascular: Normal rate.   Pulmonary/Chest: Effort normal. No respiratory distress.   Abdominal: Soft.   Soft, nondistended, palpable kidneys bilaterally with cyst present   Musculoskeletal: Normal range of motion.     Comprehensive Metabolic Profile    Lab Results   Component Value Date/Time    NA 141 11/08/2018 10:55 AM    K 4.0 11/08/2018 10:55 AM    CL 109 11/08/2018 10:55 AM    CO2 23 11/08/2018 10:55 AM    GAP 9 11/08/2018 10:55 AM    BUN 21 11/08/2018 10:55 AM    CR 1.57 (H) 11/08/2018 10:55 AM    GLU 94 11/08/2018 10:55 AM    Lab Results   Component Value Date/Time    CA 9.8 11/08/2018 10:55 AM    PO4 3.1 04/28/2017 04:18 PM    ALBUMIN 4.2 10/26/2018 03:05 PM    TOTPROT 6.9 10/26/2018 03:05 PM    ALKPHOS 66 02/02/2019    AST 20 02/02/2019    ALT 21 02/02/2019    TOTBILI 0.7 02/02/2019    GFR 46 (L) 11/08/2018 10:55 AM    GFRAA 56 (L) 11/08/2018 10:55 AM            03/13/19 MRI:   1. Redemonstration of autosomal dominant polycystic kidney disease with   marked renal involvement and moderate hepatic involvement.  2. Tiny 3 mm adherent gallstone or gallbladder wall polyp.       Assessment and Plan:    Problem   Pkd (Polycystic Kidney Disease)    Total of 6 cyst drainages  Cyst drainage at [facility]  11/01/2018: Successful aspiration of the dominant 4-5 cysts in the bilateral kidneys from the anterior approach  11/07/2018: Successful ultrasound-guided bilateral renal cyst aspiration. A total of 1 L of cyst fluid was aspirated. 03/13/19 MRI: Redemonstration of autosomal dominant polycystic kidney disease with marked renal involvement and moderate hepatic involvement.    05/21/19: Unrelieved with most recent cyst aspirations in January       PKD (polycystic kidney disease)  53 year old male with history of ADPKD with CKD 3 who is interested in cyst decortication.  He was started successful on Tovalptan in order to slow renal deterioration and has a most recent creatinine of 1.57.  We discussed the possible surgical options including a robotic cyst decortication.  This would entail a staged cyst decortication and marsupialization as we would begin on only 1 side.  We discussed the risks and benefits of the procedure including bleeding, infection, damage to surrounding structures or impaired wound healing.  We specifically discussed that this may not successfully relieve him of all of his symptoms including his shortness of breath and apnea.  We also discussed the risks of impaired renal function as the renal parenchyma is difficult to visualize as well as the possibility of having a urine leak.  After discussion, he would like to proceed with the surgery.    Plan:   Cystoscopy with ureteral stent placement; Robot assisted laparoscopic RIGHT cyst decortication on 06/08/19    Patient seen and discussed with Dr. Perlie Gold, who directed plan of care.    Conrad Burlington, MD  Urology  Pager [phone]    Orders Placed This Encounter   ??? CULTURE-URINE W/SENSITIVITY Chief Financial Officer)   ??? CULTURE-URINE W/SENSITIVITY (Clinic Collect)     ATTESTATION    I personally performed the key portions of the E/M visit, discussed case with resident and concur with  resident documentation of history, physical exam, assessment, and treatment plan unless otherwise noted.    Patient referred to me by Dr. Rocco Serene for consideration of a cyst decortication to relieve his abdominal pressure in the setting of ADPCKD. He has had several cyst aspirations which have relieved his symptoms for a few years at a time, but the cyst aspirations are no longer working as anticipated.  He is referred for a more definitive therapy.  We discussed a robotic cyst decortication and the pros and cons and risks and benefits of the procedure.  After discussion, the patient would like to proceed.  We will start on the RIGHT side given larger cysts on that side.  If he does well, then we would do the LEFT side approximately 6 weeks after his first decortication.  He understands that this may not relieve his pain/pressure.  We also run the risk of damage to the renal parenchyma.  Patient agrees with plan.  We will proceed in the near future.  Consent obtained.  All questions answered.  Complex counseling and decision making.     Staff name:  Earl Many, MD Date:  05/21/2019

## 2019-05-22 ENCOUNTER — Encounter: Admit: 2019-05-29 | Discharge: 2019-05-29

## 2019-05-22 DIAGNOSIS — Z01810 Encounter for preprocedural cardiovascular examination: Secondary | ICD-10-CM

## 2019-05-22 DIAGNOSIS — Q612 Polycystic kidney, adult type: Principal | ICD-10-CM

## 2019-05-22 DIAGNOSIS — Q613 Polycystic kidney, unspecified: Principal | ICD-10-CM

## 2019-05-22 LAB — CULTURE-URINE W/SENSITIVITY

## 2019-05-24 ENCOUNTER — Encounter: Admit: 2019-05-24 | Discharge: 2019-05-24

## 2019-05-25 ENCOUNTER — Encounter: Admit: 2019-05-25 | Discharge: 2019-05-25

## 2019-05-25 NOTE — Telephone Encounter
Rec'd refill request for Jynarque. Already discussed with pt need for updated labs. Routing to Dr. Cephas Darby.

## 2019-05-29 ENCOUNTER — Encounter: Admit: 2019-05-29 | Discharge: 2019-05-29

## 2019-05-29 ENCOUNTER — Ambulatory Visit: Admit: 2019-05-29 | Discharge: 2019-05-29 | Payer: No Typology Code available for payment source

## 2019-05-29 ENCOUNTER — Encounter: Admit: 2019-06-08 | Discharge: 2019-06-08

## 2019-05-29 ENCOUNTER — Ambulatory Visit: Admit: 2019-05-29 | Discharge: 2019-05-30 | Payer: No Typology Code available for payment source

## 2019-05-29 DIAGNOSIS — K219 Gastro-esophageal reflux disease without esophagitis: Secondary | ICD-10-CM

## 2019-05-29 DIAGNOSIS — I480 Paroxysmal atrial fibrillation: Secondary | ICD-10-CM

## 2019-05-29 DIAGNOSIS — Z01810 Encounter for preprocedural cardiovascular examination: Secondary | ICD-10-CM

## 2019-05-29 DIAGNOSIS — R0681 Apnea, not elsewhere classified: Secondary | ICD-10-CM

## 2019-05-29 DIAGNOSIS — E785 Hyperlipidemia, unspecified: Secondary | ICD-10-CM

## 2019-05-29 DIAGNOSIS — I10 Essential (primary) hypertension: Secondary | ICD-10-CM

## 2019-05-29 DIAGNOSIS — Q612 Polycystic kidney, adult type: Principal | ICD-10-CM

## 2019-05-29 DIAGNOSIS — R0602 Shortness of breath: Secondary | ICD-10-CM

## 2019-05-29 LAB — COMPREHENSIVE METABOLIC PANEL
Lab: 0.5 mg/dL (ref 0.3–1.2)
Lab: 1.7 mg/dL — ABNORMAL HIGH (ref 0.4–1.24)
Lab: 10 mg/dL (ref 8.5–10.6)
Lab: 107 MMOL/L (ref 98–110)
Lab: 140 MMOL/L (ref 137–147)
Lab: 16 U/L (ref 7–40)
Lab: 16 U/L (ref 7–56)
Lab: 22 mg/dL (ref 7–25)
Lab: 26 MMOL/L (ref 21–30)
Lab: 4.3 g/dL (ref 3.5–5.0)
Lab: 4.5 MMOL/L (ref 3.5–5.1)
Lab: 59 U/L (ref 25–110)
Lab: 7.1 g/dL (ref 6.0–8.0)
Lab: 89 mg/dL (ref 70–100)
Lab: 7 (ref 3–12)

## 2019-05-29 LAB — CBC: Lab: 6.8 10*3/uL (ref 4.5–11.0)

## 2019-05-29 MED ORDER — JYNARQUE 60 MG (AM)/ 30 MG (PM) PO TBSQ
ORAL_TABLET | ORAL | 11 refills | Status: DC
Start: 2019-05-29 — End: 2020-05-16

## 2019-05-29 NOTE — Telephone Encounter
Pt calling to ask an approximate return to work date for surgery scheduled for 06/08/2019.      Pt states he has started a new job and employer asking how long he will need to be off.

## 2019-05-30 ENCOUNTER — Encounter: Admit: 2019-05-30 | Discharge: 2019-05-30

## 2019-05-30 DIAGNOSIS — Q612 Polycystic kidney, adult type: Principal | ICD-10-CM

## 2019-05-30 NOTE — Progress Notes
[facility] Pharmacist Medication Plan Note:    Matthew Paul was seen in the [facility] on 05/29/19.  As part of the visit, an accurate medication list was obtained and the patient was given pre-op medication instructions for upcoming surgery on 06/08/19 with Dr. Morene Crocker.      Per Juliann Pulse RN with Dr. Laurance Flatten, Aspirin may be held for 7 days prior to the procedure.     Per Jarrett Soho LPN with Dr. Cephas Darby as documented in O2 on 05/30/19: "Per Dr. Cephas Darby, ok for pt to hold Jynarque, especially during hospital stay if pt will be here for a few days and can restart once he is recovered and after he has 24hrs of good fluid intake prior to restarting."    The plan above was communicated to the patient who verbalized understanding during [facility] office visit.     David Stall, [place]

## 2019-05-30 NOTE — Telephone Encounter
Patient called stating he never received a call back about his estimated return to work date. Contacted patient and notified him that Dr. Waldron Session recommendation is 3 weeks off work and no heavy lifting for 6 weeks post-surgery.

## 2019-05-30 NOTE — Telephone Encounter
Late entry for 05/29/2019 @ 1630. Rec'd call from [facility] pharmacist re: pt Jynarque. He would like to hold it the morning of and they wanted to make sure this would be ok. Per Dr. Cephas Darby, ok for pt to hold Jynarque, especially during hospital stay if pt will be here for a few days and can restart once he is recovered and after he has 24hrs of good fluid intake prior to restarting. LVM for [facility] pharmacist with this info.    05/30/2019 @ 0820:  Spoke with pt to notify Jynarque refill has been sent in. Notified him of the above recommendations and that he may also receive message from [facility] with this info. Pt v/u with no questions or concerns by end of call.

## 2019-05-30 NOTE — Telephone Encounter
I talked to this pt on the phone and told him about the length of time he will be off for the surgery. Pt thanked me for calling him back.          Terie Purser, MD   to Archie Patten, [place], [place]           6:26 PM   Otho Ket,     Please let him know that he will need to be off at least 3 weeks. No lifting over 15# for 6 weeks.     Thanks.   DD            8:51 AM   Inez Catalina, LPN routed this conversation to Terie Purser, MD   Inez Catalina, LPN          [id number] AM   Note      Pt calling to ask an approximate return to work date for surgery scheduled for 06/08/2019.      Pt states he has started a new job and employer asking how long he will need to be off.               8:50 AM      Bobbe Medico, Jolayne Panther contacted Inez Catalina, LPN    Additional Documentation     Encounter Info:    Billing Info,    History,    Allergies,    Detailed Report       Orders Placed      None   Medication Renewals and Changes        None      Medication List     Visit Diagnoses        None      Problem List

## 2019-06-06 ENCOUNTER — Encounter: Admit: 2019-06-06 | Discharge: 2019-06-07

## 2019-06-06 DIAGNOSIS — Z01818 Encounter for other preprocedural examination: Secondary | ICD-10-CM

## 2019-06-06 NOTE — Progress Notes
Patient arrived to [facility] for COVID-19 testing 06/06/19 0826. Patient identity confirmed via photo I.D. Nasopharyngeal procedure explained to the patient.   Nasopharyngeal swab completed left  Patient education provided given and instructed patient self isolate until contacted w/ results and further instructions. CDC handout on COVID-19 given to patient.   [email or web address]    Swab collected by Cephus Shelling RN.    Date symptoms began/reason for testing: preop

## 2019-06-07 ENCOUNTER — Encounter: Admit: 2019-06-07 | Discharge: 2019-06-07

## 2019-06-07 DIAGNOSIS — Z1159 Encounter for screening for other viral diseases: Principal | ICD-10-CM

## 2019-06-07 LAB — COVID-19 (SARS-COV-2) PCR

## 2019-06-08 ENCOUNTER — Encounter: Admit: 2019-06-08 | Discharge: 2019-06-08

## 2019-06-08 DIAGNOSIS — E785 Hyperlipidemia, unspecified: Secondary | ICD-10-CM

## 2019-06-08 DIAGNOSIS — R0602 Shortness of breath: Secondary | ICD-10-CM

## 2019-06-08 DIAGNOSIS — R0681 Apnea, not elsewhere classified: Secondary | ICD-10-CM

## 2019-06-08 DIAGNOSIS — I10 Essential (primary) hypertension: Secondary | ICD-10-CM

## 2019-06-08 DIAGNOSIS — I480 Paroxysmal atrial fibrillation: Secondary | ICD-10-CM

## 2019-06-08 DIAGNOSIS — K219 Gastro-esophageal reflux disease without esophagitis: Secondary | ICD-10-CM

## 2019-06-08 DIAGNOSIS — Q612 Polycystic kidney, adult type: Secondary | ICD-10-CM

## 2019-06-08 LAB — COMPREHENSIVE METABOLIC PANEL
Lab: 0.5 mg/dL (ref 0.3–1.2)
Lab: 1.7 mg/dL — ABNORMAL HIGH (ref 0.4–1.24)
Lab: 10 mg/dL (ref 8.5–10.6)
Lab: 109 MMOL/L (ref 98–110)
Lab: 14 U/L (ref 7–56)
Lab: 143 MMOL/L (ref 137–147)
Lab: 16 U/L (ref 7–40)
Lab: 25 MMOL/L (ref 21–30)
Lab: 28 mg/dL — ABNORMAL HIGH (ref 7–25)
Lab: 4.2 g/dL (ref 3.5–5.0)
Lab: 42 mL/min — ABNORMAL LOW (ref >60)
Lab: 51 mL/min — ABNORMAL LOW (ref >60)
Lab: 57 U/L (ref 25–110)
Lab: 6.9 g/dL (ref 6.0–8.0)
Lab: 92 mg/dL (ref 70–100)
Lab: 9 (ref 3–12)

## 2019-06-08 LAB — PROTIME INR (PT): Lab: 1 MMOL/L (ref 0.8–1.2)

## 2019-06-08 LAB — CBC: Lab: 6.5 10*3/uL (ref 4.5–11.0)

## 2019-06-08 MED ORDER — POLYETHYLENE GLYCOL 3350 17 GRAM PO PWPK
1 | Freq: Every day | ORAL | 0 refills | Status: DC | PRN
Start: 2019-06-08 — End: 2019-06-08

## 2019-06-08 MED ORDER — SODIUM CHLORIDE 0.9 % IV SOLP
INTRAVENOUS | 0 refills | Status: DC
Start: 2019-06-08 — End: 2019-06-08
  Administered 2019-06-08: 11:00:00 1000.000 mL via INTRAVENOUS

## 2019-06-08 MED ORDER — HYOSCYAMINE SULFATE 0.125 MG PO TBDI
.125 mg | SUBLINGUAL | 0 refills | Status: DC | PRN
Start: 2019-06-08 — End: 2019-06-09
  Administered 2019-06-08: 18:00:00 0.125 mg via SUBLINGUAL

## 2019-06-08 MED ORDER — FENTANYL CITRATE (PF) 50 MCG/ML IJ SOLN
50 ug | INTRAVENOUS | 0 refills | Status: DC | PRN
Start: 2019-06-08 — End: 2019-06-08
  Administered 2019-06-08: 17:00:00 50 ug via INTRAVENOUS

## 2019-06-08 MED ORDER — FENTANYL CITRATE (PF) 50 MCG/ML IJ SOLN
25-50 ug | INTRAVENOUS | 0 refills | Status: DC | PRN
Start: 2019-06-08 — End: 2019-06-09

## 2019-06-08 MED ORDER — ROCURONIUM 10 MG/ML IV SOLN
INTRAVENOUS | 0 refills | Status: DC
Start: 2019-06-08 — End: 2019-06-08
  Administered 2019-06-08: 13:00:00 40 mg via INTRAVENOUS
  Administered 2019-06-08 (×3): 20 mg via INTRAVENOUS
  Administered 2019-06-08: 16:00:00 10 mg via INTRAVENOUS

## 2019-06-08 MED ORDER — ONDANSETRON HCL (PF) 4 MG/2 ML IJ SOLN
4 mg | INTRAVENOUS | 0 refills | Status: DC | PRN
Start: 2019-06-08 — End: 2019-06-09

## 2019-06-08 MED ORDER — FENTANYL CITRATE (PF) 50 MCG/ML IJ SOLN
0 refills | Status: DC
Start: 2019-06-08 — End: 2019-06-08
  Administered 2019-06-08: 14:00:00 50 ug via INTRAVENOUS
  Administered 2019-06-08: 12:00:00 100 ug via INTRAVENOUS
  Administered 2019-06-08: 13:00:00 50 ug via INTRAVENOUS

## 2019-06-08 MED ORDER — ASPIRIN 81 MG PO CHEW
81 mg | Freq: Every day | ORAL | 0 refills | Status: DC
Start: 2019-06-08 — End: 2019-06-09
  Administered 2019-06-08 – 2019-06-09 (×2): 81 mg via ORAL

## 2019-06-08 MED ORDER — ELECTROLYTE-A IV SOLP
0 refills | Status: DC
Start: 2019-06-08 — End: 2019-06-08
  Administered 2019-06-08 (×2): via INTRAVENOUS

## 2019-06-08 MED ORDER — OXYCODONE 5 MG PO TAB
5-10 mg | Freq: Once | ORAL | 0 refills | Status: CP | PRN
Start: 2019-06-08 — End: ?
  Administered 2019-06-08: 18:00:00 10 mg via ORAL

## 2019-06-08 MED ORDER — ONDANSETRON HCL (PF) 4 MG/2 ML IJ SOLN
INTRAVENOUS | 0 refills | Status: DC
Start: 2019-06-08 — End: 2019-06-08
  Administered 2019-06-08: 16:00:00 4 mg via INTRAVENOUS

## 2019-06-08 MED ORDER — ENOXAPARIN 40 MG/0.4 ML SC SYRG
40 mg | Freq: Every day | SUBCUTANEOUS | 0 refills | Status: DC
Start: 2019-06-08 — End: 2019-06-09
  Administered 2019-06-09: 02:00:00 40 mg via SUBCUTANEOUS

## 2019-06-08 MED ORDER — POLYETHYLENE GLYCOL 3350 17 GRAM PO PWPK
1 | Freq: Every day | ORAL | 0 refills | Status: DC
Start: 2019-06-08 — End: 2019-06-09
  Administered 2019-06-09: 02:00:00 17 g via ORAL

## 2019-06-08 MED ORDER — SUGAMMADEX 100 MG/ML IV SOLN
INTRAVENOUS | 0 refills | Status: DC
Start: 2019-06-08 — End: 2019-06-08
  Administered 2019-06-08: 16:00:00 257 mg via INTRAVENOUS

## 2019-06-08 MED ORDER — PHENYLEPHRINE 40 MCG/ML IN NS IV DRIP (STD CONC)
0 refills | Status: DC
Start: 2019-06-08 — End: 2019-06-08
  Administered 2019-06-08 (×2): 0.4 ug/kg/min via INTRAVENOUS

## 2019-06-08 MED ORDER — CEFAZOLIN INJ 1GM IVP
3 g | Freq: Once | INTRAVENOUS | 0 refills | Status: CP
Start: 2019-06-08 — End: ?
  Administered 2019-06-08: 16:00:00 2 g via INTRAVENOUS
  Administered 2019-06-08: 12:00:00 3 g via INTRAVENOUS

## 2019-06-08 MED ORDER — EPHEDRINE SULFATE 50 MG/5ML SYR (10 MG/ML) (AN)(OSM)
0 refills | Status: DC
Start: 2019-06-08 — End: 2019-06-08
  Administered 2019-06-08 (×2): 10 mg via INTRAVENOUS

## 2019-06-08 MED ORDER — ATORVASTATIN 20 MG PO TAB
20 mg | Freq: Every day | ORAL | 0 refills | Status: DC
Start: 2019-06-08 — End: 2019-06-09
  Administered 2019-06-09: 14:00:00 20 mg via ORAL

## 2019-06-08 MED ORDER — HEPARIN, PORCINE (PF) 5,000 UNIT/0.5 ML IJ SYRG
5000 [IU] | SUBCUTANEOUS | 0 refills | Status: DC
Start: 2019-06-08 — End: 2019-06-09

## 2019-06-08 MED ORDER — HYDROMORPHONE (PF) 2 MG/ML IJ SYRG
.5-1 mg | INTRAVENOUS | 0 refills | Status: DC | PRN
Start: 2019-06-08 — End: 2019-06-08
  Administered 2019-06-08: 18:00:00 0.5 mg via INTRAVENOUS

## 2019-06-08 MED ORDER — LISINOPRIL 20 MG PO TAB
20 mg | Freq: Every day | ORAL | 0 refills | Status: DC
Start: 2019-06-08 — End: 2019-06-09
  Administered 2019-06-09: 14:00:00 20 mg via ORAL

## 2019-06-08 MED ORDER — PANTOPRAZOLE 40 MG PO TBEC
40 mg | Freq: Every day | ORAL | 0 refills | Status: DC
Start: 2019-06-08 — End: 2019-06-09
  Administered 2019-06-09: 02:00:00 40 mg via ORAL

## 2019-06-08 MED ORDER — SODIUM CHLORIDE 0.9 % IV SOLP
INTRAVENOUS | 0 refills | Status: DC
Start: 2019-06-08 — End: 2019-06-09
  Administered 2019-06-08 – 2019-06-09 (×2): 1000.000 mL via INTRAVENOUS

## 2019-06-08 MED ORDER — OXYCODONE 5 MG PO TAB
5-10 mg | ORAL | 0 refills | Status: DC | PRN
Start: 2019-06-08 — End: 2019-06-09
  Administered 2019-06-08: 20:00:00 10 mg via ORAL

## 2019-06-08 MED ORDER — DEXAMETHASONE SODIUM PHOSPHATE 4 MG/ML IJ SOLN
INTRAVENOUS | 0 refills | Status: DC
Start: 2019-06-08 — End: 2019-06-08
  Administered 2019-06-08: 12:00:00 4 mg via INTRAVENOUS

## 2019-06-08 MED ORDER — HALOPERIDOL LACTATE 5 MG/ML IJ SOLN
1 mg | Freq: Once | INTRAVENOUS | 0 refills | Status: DC | PRN
Start: 2019-06-08 — End: 2019-06-08

## 2019-06-08 MED ORDER — BACITRACIN ZINC 500 UNIT/GRAM TP OINT
Freq: Two times a day (BID) | TOPICAL | 0 refills | Status: DC
Start: 2019-06-08 — End: 2019-06-09
  Administered 2019-06-08: 20:00:00 via TOPICAL

## 2019-06-08 MED ORDER — SENNOSIDES-DOCUSATE SODIUM 8.6-50 MG PO TAB
1 | Freq: Two times a day (BID) | ORAL | 0 refills | Status: DC
Start: 2019-06-08 — End: 2019-06-09
  Administered 2019-06-08 – 2019-06-09 (×3): 1 via ORAL

## 2019-06-08 MED ORDER — ACETAMINOPHEN 325 MG PO TAB
650 mg | ORAL | 0 refills | Status: DC | PRN
Start: 2019-06-08 — End: 2019-06-09

## 2019-06-08 MED ORDER — PHENYLEPHRINE IN 0.9% NACL(PF) 1 MG/10 ML (100 MCG/ML) IV SYRG
INTRAVENOUS | 0 refills | Status: DC
Start: 2019-06-08 — End: 2019-06-08
  Administered 2019-06-08: 12:00:00 200 ug via INTRAVENOUS

## 2019-06-08 MED ORDER — MEPERIDINE (PF) 25 MG/ML IJ SYRG
12.5 mg | INTRAVENOUS | 0 refills | Status: DC | PRN
Start: 2019-06-08 — End: 2019-06-08

## 2019-06-08 MED ORDER — DEXTRAN 70-HYPROMELLOSE (PF) 0.1-0.3 % OP DPET
0 refills | Status: DC
Start: 2019-06-08 — End: 2019-06-08
  Administered 2019-06-08: 12:00:00 2 [drp] via OPHTHALMIC

## 2019-06-08 MED ORDER — MIDAZOLAM 1 MG/ML IJ SOLN
INTRAVENOUS | 0 refills | Status: DC
Start: 2019-06-08 — End: 2019-06-08
  Administered 2019-06-08: 12:00:00 2 mg via INTRAVENOUS

## 2019-06-08 MED ORDER — LIDOCAINE (PF) 200 MG/10 ML (2 %) IJ SYRG
0 refills | Status: DC
Start: 2019-06-08 — End: 2019-06-08
  Administered 2019-06-08: 12:00:00 100 mg via INTRAVENOUS

## 2019-06-08 MED ORDER — VASOPRESSIN 20 UNIT/ML IV SOLN
0 refills | Status: DC
Start: 2019-06-08 — End: 2019-06-08
  Administered 2019-06-08 (×4): 1 [IU] via INTRAVENOUS

## 2019-06-08 MED ORDER — SUCCINYLCHOLINE CHLORIDE 20 MG/ML IJ SOLN
INTRAVENOUS | 0 refills | Status: DC
Start: 2019-06-08 — End: 2019-06-08
  Administered 2019-06-08: 12:00:00 140 mg via INTRAVENOUS

## 2019-06-08 MED ORDER — LIDOCAINE (PF) 10 MG/ML (1 %) IJ SOLN
.1-2 mL | INTRAMUSCULAR | 0 refills | Status: DC | PRN
Start: 2019-06-08 — End: 2019-06-08

## 2019-06-08 MED ORDER — PROPOFOL INJ 10 MG/ML IV VIAL
0 refills | Status: DC
Start: 2019-06-08 — End: 2019-06-08
  Administered 2019-06-08: 12:00:00 200 mg via INTRAVENOUS

## 2019-06-08 MED ORDER — BUPIVACAINE 0.25 % (2.5 MG/ML) IJ SOLN
0 refills | Status: DC
Start: 2019-06-08 — End: 2019-06-08
  Administered 2019-06-08: 16:00:00 30 mL via INTRAMUSCULAR

## 2019-06-08 MED ORDER — ONDANSETRON HCL 8 MG PO TAB
4 mg | ORAL | 0 refills | Status: DC | PRN
Start: 2019-06-08 — End: 2019-06-09

## 2019-06-08 MED ORDER — CEFAZOLIN INJ 1GM IVP
1 g | INTRAVENOUS | 0 refills | Status: DC
Start: 2019-06-08 — End: 2019-06-09
  Administered 2019-06-08 – 2019-06-09 (×2): 1 g via INTRAVENOUS

## 2019-06-08 MED ORDER — CHOLECALCIFEROL (VITAMIN D3) 10 MCG (400 UNIT) PO TAB
400 [IU] | Freq: Every day | ORAL | 0 refills | Status: DC
Start: 2019-06-08 — End: 2019-06-09
  Administered 2019-06-09: 14:00:00 400 [IU] via ORAL

## 2019-06-08 MED ORDER — ACETAMINOPHEN 500 MG PO TAB
1000 mg | Freq: Once | ORAL | 0 refills | Status: CP
Start: 2019-06-08 — End: ?
  Administered 2019-06-08: 18:00:00 1000 mg via ORAL

## 2019-06-08 MED ORDER — METHYLENE BLUE (ANTIDOTE) 5 MG/ML (0.5 %) IV SOLN
0 refills | Status: CP
Start: 2019-06-08 — End: ?
  Administered 2019-06-08: 16:00:00 1 mL via INTRAMUSCULAR

## 2019-06-08 NOTE — Progress Notes
Report given to [facility] in [id number] - for transfer of care.

## 2019-06-08 NOTE — Progress Notes
Patient arrived to room # [id number]*) via cart accompanied by transport. Patient transferred to the bed with assistance. Bedside safety checks completed. Initial patient assessment completed. Refer to flowsheet for details.    Admission skin assessment completed with: Cyril Mourning, RN    Pressure injury present on arrival?: No    1. Head/Face/Neck: No  2. Trunk/Back: No  3. Upper Extremities: No  4. Lower Extremities: No  5. Pelvic/Coccyx: No  6. Assessed for device associated injury? Yes  7. Malnutrition Screening Tool (Nursing Nutrition Assessment) Completed? Yes    See Doc Flowsheet for additional wound details.     INTERVENTIONS:

## 2019-06-08 NOTE — Anesthesia Post-Procedure Evaluation
Post-Anesthesia Evaluation    Name: Matthew Paul      MRN: [id number]     DOB: 1965-10-25     Age: 53 y.o.     Sex: male   __________________________________________________________________________     Procedure Information     Anesthesia Start Date/Time:  06/08/19 0711    Procedures:       ROBOTIC LAPAROSCOPIC ABLATION RENAL CYSTS (Right ) - CASE LENGTH 4 HOURS, PLEASE CLIP PT IN PRE-POST      ROBOT ASSISTED SURGERY (N/A )      CYSTOURETHROSCOPY WITH INDWELLING URETERAL STENT INSERTION (Right ) - REQUEST C-ARM    Location:  MAIN OR 03 / Main OR/Periop    Surgeon:  Terie Purser, MD          Post-Anesthesia Vitals  BP: 119/83 (08/28 1300)  Pulse: 78 (08/28 1300)  Respirations: 16 PER MINUTE (08/28 1300)  SpO2: 94 % (08/28 1300)  SpO2 Pulse: 78 (08/28 1300)   Vitals Value Taken Time   BP 119/83 06/08/2019  1:00 PM   Temp 36.5 C (97.7 F) 06/08/2019 11:40 AM   Pulse 78 06/08/2019  1:00 PM   Respirations 16 PER MINUTE 06/08/2019  1:00 PM   SpO2 94 % 06/08/2019  1:00 PM         Post Anesthesia Evaluation Note    Evaluation location: Pre/Post  Patient participation: recovered; patient participated in evaluation  Level of consciousness: alert    Pain score: 3  Pain management: adequate    Hydration: normovolemia  Temperature: 36.0C - 38.4C  Airway patency: adequate    Perioperative Events       Post-op nausea and vomiting: no PONV    Postoperative Status  Cardiovascular status: hemodynamically stable  Respiratory status: spontaneous ventilation and supplemental oxygen (2L NC)  Follow-up needed: none        Perioperative Events  Perioperative Event: No  Emergency Case Activation: No

## 2019-06-09 ENCOUNTER — Encounter: Admit: 2019-06-08 | Discharge: 2019-06-09

## 2019-06-09 ENCOUNTER — Encounter: Admit: 2019-06-09 | Discharge: 2019-06-09

## 2019-06-09 DIAGNOSIS — K219 Gastro-esophageal reflux disease without esophagitis: Secondary | ICD-10-CM

## 2019-06-09 DIAGNOSIS — Z87891 Personal history of nicotine dependence: Secondary | ICD-10-CM

## 2019-06-09 DIAGNOSIS — I4891 Unspecified atrial fibrillation: Secondary | ICD-10-CM

## 2019-06-09 DIAGNOSIS — Z7982 Long term (current) use of aspirin: Secondary | ICD-10-CM

## 2019-06-09 DIAGNOSIS — E785 Hyperlipidemia, unspecified: Secondary | ICD-10-CM

## 2019-06-09 DIAGNOSIS — N183 Chronic kidney disease, stage 3 (moderate): Secondary | ICD-10-CM

## 2019-06-09 DIAGNOSIS — I129 Hypertensive chronic kidney disease with stage 1 through stage 4 chronic kidney disease, or unspecified chronic kidney disease: Secondary | ICD-10-CM

## 2019-06-09 LAB — CBC
Lab: 14 % — ABNORMAL LOW (ref >60)
Lab: 31 pg — ABNORMAL HIGH (ref 26–34)
Lab: 33 g/dL (ref 32.0–36.0)
Lab: 4.4 M/UL — ABNORMAL LOW (ref 4.4–5.5)
Lab: 8.4 FL (ref 7–11)
Lab: 9.9 10*3/uL (ref 4.5–11.0)

## 2019-06-09 LAB — BASIC METABOLIC PANEL
Lab: 139 MMOL/L (ref 137–147)
Lab: 4.6 MMOL/L (ref 3.5–5.1)

## 2019-06-09 MED ORDER — ACETAMINOPHEN 325 MG PO TAB
650 mg | ORAL | 0 refills | Status: AC | PRN
Start: 2019-06-09 — End: ?

## 2019-06-09 MED ORDER — POLYETHYLENE GLYCOL 3350 17 GRAM PO PWPK
17 g | Freq: Two times a day (BID) | ORAL | 1 refills | 22.00000 days | Status: DC
Start: 2019-06-09 — End: 2019-10-03

## 2019-06-09 MED ORDER — SENNOSIDES-DOCUSATE SODIUM 8.6-50 MG PO TAB
1 | ORAL_TABLET | Freq: Two times a day (BID) | ORAL | 1 refills | Status: DC
Start: 2019-06-09 — End: 2019-10-03

## 2019-06-09 MED ORDER — OXYCODONE 5 MG PO TAB
5-10 mg | ORAL_TABLET | ORAL | 0 refills | 6.00000 days | Status: DC | PRN
Start: 2019-06-09 — End: 2019-10-03
  Filled 2019-06-09: qty 30, 3d supply, fill #1

## 2019-06-09 NOTE — Progress Notes
[facility] discharged on 06/09/2019.   Marland Kitchen  Discharge instructions reviewed with patient.  Valuables returned:   Personal Items / Valuables: Clothing, Cell Phone  Where Are Valuables Stored?: all belongings to family in [place].  Home medications:    .  Functional assessment at discharge complete: Yes .

## 2019-06-09 NOTE — Care Plan
Problem: Infection, Risk of, Urinary Catheter-Associated Urinary Tract Infection  Goal: Absence of urinary catheter-associated infection  06/08/2019 2257 by Faustino Congress, RN  Outcome: Goal Ongoing  Flowsheets (Taken 06/08/2019 2257)  Absence of urinary catheter-associated infections:   Manage urinary catheter   Provide patient/family education on CAUTI prevention   Assess for signs and sypmtoms of catheter-associated urinary tract infection  06/08/2019 2256 by Faustino Congress, RN  Outcome: Goal Ongoing     Problem: Falls, High Risk of  Goal: Absence of falls-Adult Patient  06/08/2019 2257 by Faustino Congress, RN  Outcome: Goal Ongoing  Flowsheets (Taken 06/08/2019 2257)  Absence of falls-Adult Patient:   Consider additional interventions if patient is confused, has gait/balance problems and on high risk medications.   Complete Fall Risk Assessment.   Provide safe ambulation.   Provide fall prevention strategies.   Provde safe environment.   Implement fall risk bundle.  06/08/2019 2256 by Faustino Congress, RN  Outcome: Goal Ongoing     Problem: Discharge Planning  Goal: Participation in plan of care  Outcome: Goal Ongoing  Flowsheets (Taken 06/08/2019 2257)  Participation in Plan of Care: Involve patient/caregiver in care planning decision making  Goal: Knowledge regarding plan of care  Outcome: Goal Ongoing  Flowsheets (Taken 06/08/2019 2257)  Knowledge regarding plan of care:   Provide admission education to parent/caregiver   Provide fall prevention education   Provide VTE signs and symptoms education   Provide infection prevention education   Provide plan of care education   Provide procedural and treatment education   Provide medication management education  Goal: Prepared for discharge  Outcome: Goal Ongoing  Flowsheets (Taken 06/08/2019 2257)  Prepared for discharge:   Complete ADL ability assessment   Provide safe use medical equipment education   Provide diet and oral health education Collaborate with multidisciplinary team for hospital discharge coordination

## 2019-06-09 NOTE — Care Plan
Problem: Infection, Risk of, Urinary Catheter-Associated Urinary Tract Infection  Goal: Absence of urinary catheter-associated infection  Outcome: Goal Achieved     Problem: Falls, High Risk of  Goal: Absence of falls-Adult Patient  Outcome: Goal Achieved     Problem: Discharge Planning  Goal: Participation in plan of care  Outcome: Goal Achieved  Goal: Knowledge regarding plan of care  Outcome: Goal Achieved  Goal: Prepared for discharge  Outcome: Goal Achieved

## 2019-06-09 NOTE — Progress Notes
Urology Progress Note 06/09/2019     ASSESSMENT:  Matthew Paul is a 53 y.o. Male with ADPKD, A fib (no anticoagulation), CKD3 s/p RIGHT robotic cyst decortication on 06/08/2019 with Dr. Perlie Gold.  LOS: 0 days     PLAN: Will discuss plan with staff surgeon - Dr. Shon Hale    - Pain control: PO pain  - Diet/FEN: Regular diet, SLIVF  - GI: Bowel regimen, zofran prn  - GU: Foley out, trial of void 8/29  - Heme/ID: Afebrile. AM Labs pending  - OOB/Amb   - PTA: ASA, Lipitor, cholecalciferol, lisinopril, prilosec  - Dispo: Discharge planning today. JP removed prior to discharge. Follow up in 4 weeks with BMP    Prophylaxis Review:  -  DVT: Heparin, SCD's  -  Catheter: Yes - removed for TOV  -  Abx: Yes - Peri-operative Ancef    Venancio Poisson, MD  PGY-1 Urology  Please page urology on call with questions  ________________________________________________________________________   SUBJECTIVE:   No acute events overnight. Pain is well-controlled. Patient denies nausea, denies vomiting, is ambulating. Patient has passed flatus. Patient is tolerating DIET REGULAR.    OBJECTIVE:                   Vital Signs: Most Recent                Vital Signs: Past 24 Hours   BP: 111/67 (08/29 0306)  Temp: 36.7 ???C (98.1 ???F) (08/29 1610)  Pulse: 98 (08/29 0306)  Respirations: 16 PER MINUTE (08/29 0306)  SpO2: 92 % (08/29 0306)  Height: 200.7 cm (79.02) (08/28 1404)  Weight: 128.8 kg (284 lb) (08/28 1404)  BP: (108-166)/(67-97)   Temp:  [36.5 ???C (97.7 ???F)-36.9 ???C (98.5 ???F)]   Pulse:  [74-98]   Respirations:  [12 PER MINUTE-22 PER MINUTE]   SpO2:  [90 %-98 %]      Physical Exam:  General: Alert & oriented, no acute distress  Pulm: Equal chest rise, non-labored  CV: Regular rate and rhythm  Extremities: No edema, SCD's in place  Abd: Soft, appropriately tender to palpation, non-distended. JP SS  Incisions/Wounds: Appropriately tender to palpation. Portsite and extraction site incisions C/D/I with dermabond      Intake/Output: Date 06/08/19 0701 - 06/09/19 0700 06/09/19 0701 - 06/10/19 0700   Shift 0701-1900 1901-0700 24 Hour Total 0701-1900 1901-0700 24 Hour Total   INTAKE   P.O. 778 2610 3388      I.V.(mL/kg/hr) 3150(2) 861 4011      Shift Total(mL/kg) 9604(54.0) 9811(91.4) 7829(56.2)      OUTPUT   Urine(mL/kg/hr) 1250(0.8) 2625 3875        Urine 600  600        Urine Output (ml) (Indwelling Urinary Catheter 06/08/19 0739 16 FR) 650 2625 3275      Drains 148 120 268        Drain Output (ml) Al Pimple Drain 06/08/19 1105 Abdomen) 148 120 268      Other 50  50        Estimated Blood Loss 50  50      Shift Total(mL/kg) 1448(11.2) 2745(21.3) 1308(65.7)      NET 2480 726 3206      Weight (kg) 128.8 128.8 128.8 128.8 128.8 128.8          Labs:                     Hematology  Chemistry   Recent Labs     06/08/19  0620   WBC 6.5   HGB 15.7   PLTCT 203   INR 1.0    Recent Labs     06/08/19  0620   NA 143   K 4.5   CL 109   CO2 25   BUN 28*   CR 1.70*   GFR 42*   GLU 92   CA 10.2        ACTIVE PROBLEMS:  Principal Problem:    ADPKD (autosomal dominant polycystic kidney disease)

## 2019-06-10 ENCOUNTER — Encounter: Admit: 2019-06-10 | Discharge: 2019-06-10

## 2019-06-10 DIAGNOSIS — K219 Gastro-esophageal reflux disease without esophagitis: Secondary | ICD-10-CM

## 2019-06-10 DIAGNOSIS — Q612 Polycystic kidney, adult type: Secondary | ICD-10-CM

## 2019-06-10 DIAGNOSIS — I10 Essential (primary) hypertension: Secondary | ICD-10-CM

## 2019-06-10 DIAGNOSIS — R0602 Shortness of breath: Secondary | ICD-10-CM

## 2019-06-10 DIAGNOSIS — I480 Paroxysmal atrial fibrillation: Secondary | ICD-10-CM

## 2019-06-10 DIAGNOSIS — R0681 Apnea, not elsewhere classified: Secondary | ICD-10-CM

## 2019-06-10 DIAGNOSIS — E785 Hyperlipidemia, unspecified: Secondary | ICD-10-CM

## 2019-06-29 ENCOUNTER — Encounter: Admit: 2019-06-29 | Discharge: 2019-06-29 | Payer: No Typology Code available for payment source

## 2019-06-29 NOTE — Telephone Encounter
Pt called and stated he has an appt Monday 07-02-2019 and wanted to know where the lab is located. I called and left a vm letting him know it is on the first floor of [facility].

## 2019-07-02 ENCOUNTER — Encounter: Admit: 2019-07-02 | Discharge: 2019-07-02 | Payer: No Typology Code available for payment source

## 2019-07-02 ENCOUNTER — Ambulatory Visit: Admit: 2019-07-02 | Discharge: 2019-07-02 | Payer: No Typology Code available for payment source

## 2019-07-02 LAB — BASIC METABOLIC PANEL
Lab: 110 MMOL/L (ref 98–110)
Lab: 145 MMOL/L (ref 137–147)
Lab: 4.8 MMOL/L (ref 3.5–5.1)

## 2019-08-09 ENCOUNTER — Encounter: Admit: 2019-08-09 | Discharge: 2019-08-09 | Payer: No Typology Code available for payment source

## 2019-08-09 DIAGNOSIS — Q612 Polycystic kidney, adult type: Secondary | ICD-10-CM

## 2019-08-15 ENCOUNTER — Encounter: Admit: 2019-08-15 | Discharge: 2019-08-15 | Payer: No Typology Code available for payment source

## 2019-08-15 DIAGNOSIS — Q612 Polycystic kidney, adult type: Secondary | ICD-10-CM

## 2019-09-04 ENCOUNTER — Encounter: Admit: 2019-09-04 | Discharge: 2019-09-04 | Payer: No Typology Code available for payment source

## 2019-09-13 ENCOUNTER — Ambulatory Visit: Admit: 2019-09-13 | Discharge: 2019-09-14 | Payer: No Typology Code available for payment source

## 2019-09-13 ENCOUNTER — Encounter: Admit: 2019-09-13 | Discharge: 2019-09-13 | Payer: No Typology Code available for payment source

## 2019-09-13 DIAGNOSIS — K219 Gastro-esophageal reflux disease without esophagitis: Secondary | ICD-10-CM

## 2019-09-13 DIAGNOSIS — R0602 Shortness of breath: Secondary | ICD-10-CM

## 2019-09-13 DIAGNOSIS — I480 Paroxysmal atrial fibrillation: Secondary | ICD-10-CM

## 2019-09-13 DIAGNOSIS — R0681 Apnea, not elsewhere classified: Secondary | ICD-10-CM

## 2019-09-13 DIAGNOSIS — E785 Hyperlipidemia, unspecified: Secondary | ICD-10-CM

## 2019-09-13 DIAGNOSIS — I10 Essential (primary) hypertension: Secondary | ICD-10-CM

## 2019-09-13 DIAGNOSIS — M109 Gout, unspecified: Secondary | ICD-10-CM

## 2019-09-13 DIAGNOSIS — Q612 Polycystic kidney, adult type: Secondary | ICD-10-CM

## 2019-09-18 ENCOUNTER — Encounter: Admit: 2019-09-18 | Discharge: 2019-09-18 | Payer: No Typology Code available for payment source

## 2019-09-18 DIAGNOSIS — R0602 Shortness of breath: Secondary | ICD-10-CM

## 2019-09-18 DIAGNOSIS — I10 Essential (primary) hypertension: Secondary | ICD-10-CM

## 2019-09-18 DIAGNOSIS — I480 Paroxysmal atrial fibrillation: Secondary | ICD-10-CM

## 2019-09-18 DIAGNOSIS — K219 Gastro-esophageal reflux disease without esophagitis: Secondary | ICD-10-CM

## 2019-09-18 DIAGNOSIS — M109 Gout, unspecified: Secondary | ICD-10-CM

## 2019-09-18 DIAGNOSIS — E785 Hyperlipidemia, unspecified: Secondary | ICD-10-CM

## 2019-09-18 DIAGNOSIS — Q612 Polycystic kidney, adult type: Secondary | ICD-10-CM

## 2019-09-18 DIAGNOSIS — R0681 Apnea, not elsewhere classified: Secondary | ICD-10-CM

## 2019-09-30 ENCOUNTER — Encounter: Admit: 2019-09-30 | Discharge: 2019-10-01 | Payer: No Typology Code available for payment source

## 2019-10-01 ENCOUNTER — Encounter: Admit: 2019-10-01 | Discharge: 2019-10-01 | Payer: No Typology Code available for payment source

## 2019-10-01 DIAGNOSIS — R0602 Shortness of breath: Secondary | ICD-10-CM

## 2019-10-01 DIAGNOSIS — E785 Hyperlipidemia, unspecified: Secondary | ICD-10-CM

## 2019-10-01 DIAGNOSIS — Q612 Polycystic kidney, adult type: Secondary | ICD-10-CM

## 2019-10-01 DIAGNOSIS — I10 Essential (primary) hypertension: Secondary | ICD-10-CM

## 2019-10-01 DIAGNOSIS — M109 Gout, unspecified: Secondary | ICD-10-CM

## 2019-10-01 DIAGNOSIS — R0681 Apnea, not elsewhere classified: Secondary | ICD-10-CM

## 2019-10-01 DIAGNOSIS — I480 Paroxysmal atrial fibrillation: Secondary | ICD-10-CM

## 2019-10-01 DIAGNOSIS — K219 Gastro-esophageal reflux disease without esophagitis: Secondary | ICD-10-CM

## 2019-10-01 DIAGNOSIS — Z20828 Contact with and (suspected) exposure to other viral communicable diseases: Secondary | ICD-10-CM

## 2019-10-02 ENCOUNTER — Encounter: Admit: 2019-10-02 | Discharge: 2019-10-02 | Payer: No Typology Code available for payment source

## 2019-10-02 DIAGNOSIS — K219 Gastro-esophageal reflux disease without esophagitis: Secondary | ICD-10-CM

## 2019-10-02 DIAGNOSIS — R0681 Apnea, not elsewhere classified: Secondary | ICD-10-CM

## 2019-10-02 DIAGNOSIS — E785 Hyperlipidemia, unspecified: Secondary | ICD-10-CM

## 2019-10-02 DIAGNOSIS — R0602 Shortness of breath: Secondary | ICD-10-CM

## 2019-10-02 DIAGNOSIS — Q612 Polycystic kidney, adult type: Secondary | ICD-10-CM

## 2019-10-02 DIAGNOSIS — I10 Essential (primary) hypertension: Secondary | ICD-10-CM

## 2019-10-02 DIAGNOSIS — M109 Gout, unspecified: Secondary | ICD-10-CM

## 2019-10-02 DIAGNOSIS — I480 Paroxysmal atrial fibrillation: Secondary | ICD-10-CM

## 2019-10-02 MED ORDER — LIDOCAINE (PF) 200 MG/10 ML (2 %) IJ SYRG
0 refills | Status: DC
Start: 2019-10-02 — End: 2019-10-02
  Administered 2019-10-02: 14:00:00 100 mg via INTRAVENOUS

## 2019-10-02 MED ORDER — CEFAZOLIN INJ 1GM IVP
3 g | Freq: Once | INTRAVENOUS | 0 refills | Status: CP
Start: 2019-10-02 — End: ?
  Administered 2019-10-02: 15:00:00 3 g via INTRAVENOUS

## 2019-10-02 MED ORDER — MIDAZOLAM 1 MG/ML IJ SOLN
INTRAVENOUS | 0 refills | Status: DC
Start: 2019-10-02 — End: 2019-10-02

## 2019-10-02 MED ORDER — MELATONIN 3 MG PO TAB
3 mg | Freq: Every evening | ORAL | 0 refills | Status: DC | PRN
Start: 2019-10-02 — End: 2019-10-03

## 2019-10-02 MED ORDER — SUCCINYLCHOLINE CHLORIDE 20 MG/ML IJ SOLN
INTRAVENOUS | 0 refills | Status: DC
Start: 2019-10-02 — End: 2019-10-02
  Administered 2019-10-02: 14:00:00 140 mg via INTRAVENOUS

## 2019-10-02 MED ORDER — LIDOCAINE (PF) 10 MG/ML (1 %) IJ SOLN
.1-2 mL | INTRAMUSCULAR | 0 refills | Status: DC | PRN
Start: 2019-10-02 — End: 2019-10-03

## 2019-10-02 MED ORDER — TOLVAPTAN 15 MG PO TAB
30 mg | Freq: Two times a day (BID) | ORAL | 0 refills | Status: DC
Start: 2019-10-02 — End: 2019-10-03

## 2019-10-02 MED ORDER — LORATADINE 10 MG PO TAB
10 mg | Freq: Every day | ORAL | 0 refills | Status: DC | PRN
Start: 2019-10-02 — End: 2019-10-03

## 2019-10-02 MED ORDER — LISINOPRIL 10 MG PO TAB
20 mg | Freq: Every day | ORAL | 0 refills | Status: DC
Start: 2019-10-02 — End: 2019-10-03
  Administered 2019-10-03: 15:00:00 20 mg via ORAL

## 2019-10-02 MED ORDER — OXYBUTYNIN CHLORIDE 5 MG PO TAB
5 mg | Freq: Three times a day (TID) | ORAL | 0 refills | Status: DC | PRN
Start: 2019-10-02 — End: 2019-10-03

## 2019-10-02 MED ORDER — ACETAMINOPHEN 325 MG PO TAB
650 mg | ORAL | 0 refills | Status: DC
Start: 2019-10-02 — End: 2019-10-03
  Administered 2019-10-03 (×2): 650 mg via ORAL

## 2019-10-02 MED ORDER — ENOXAPARIN 40 MG/0.4 ML SC SYRG
40 mg | Freq: Every day | SUBCUTANEOUS | 0 refills | Status: DC
Start: 2019-10-02 — End: 2019-10-03

## 2019-10-02 MED ORDER — SODIUM CHLORIDE 0.9 % IV SOLP
1000 mL | INTRAVENOUS | 0 refills | Status: DC
Start: 2019-10-02 — End: 2019-10-03

## 2019-10-02 MED ORDER — SUGAMMADEX 100 MG/ML IV SOLN
INTRAVENOUS | 0 refills | Status: DC
Start: 2019-10-02 — End: 2019-10-02
  Administered 2019-10-02: 18:00:00 250 mg via INTRAVENOUS

## 2019-10-02 MED ORDER — OXYCODONE 5 MG PO TAB
5-10 mg | ORAL | 0 refills | Status: DC | PRN
Start: 2019-10-02 — End: 2019-10-03
  Administered 2019-10-03: 10 mg via ORAL

## 2019-10-02 MED ORDER — VASOPRESSIN 20 UNIT/ML IV SOLN
0 refills | Status: DC
Start: 2019-10-02 — End: 2019-10-02
  Administered 2019-10-02 (×3): 1 [IU] via INTRAVENOUS

## 2019-10-02 MED ORDER — BUPIVACAINE 0.25 % (2.5 MG/ML) IJ SOLN
0 refills | Status: DC
Start: 2019-10-02 — End: 2019-10-02
  Administered 2019-10-02: 18:00:00 50 mL via INTRAMUSCULAR

## 2019-10-02 MED ORDER — ELECTROLYTE-A IV SOLP
0 refills | Status: DC
Start: 2019-10-02 — End: 2019-10-02
  Administered 2019-10-02: 14:00:00 via INTRAVENOUS

## 2019-10-02 MED ORDER — DEXAMETHASONE SODIUM PHOSPHATE 4 MG/ML IJ SOLN
INTRAVENOUS | 0 refills | Status: DC
Start: 2019-10-02 — End: 2019-10-02
  Administered 2019-10-02: 15:00:00 4 mg via INTRAVENOUS

## 2019-10-02 MED ORDER — PHENYLEPHRINE 40 MCG/ML IN NS IV DRIP (STD CONC)
0 refills | Status: DC
Start: 2019-10-02 — End: 2019-10-02
  Administered 2019-10-02: 17:00:00 250.000 mL via INTRAVENOUS
  Administered 2019-10-02 (×2): .3 ug/kg/min via INTRAVENOUS
  Administered 2019-10-02: 17:00:00 250.000 mL via INTRAVENOUS

## 2019-10-02 MED ORDER — CEFOXITIN IVPB
3 g | Freq: Once | INTRAVENOUS | 0 refills | Status: DC
Start: 2019-10-02 — End: 2019-10-02

## 2019-10-02 MED ORDER — ASPIRIN 81 MG PO CHEW
81 mg | Freq: Every day | ORAL | 0 refills | Status: DC
Start: 2019-10-02 — End: 2019-10-03
  Administered 2019-10-03: 15:00:00 81 mg via ORAL

## 2019-10-02 MED ORDER — SENNOSIDES-DOCUSATE SODIUM 8.6-50 MG PO TAB
1 | Freq: Two times a day (BID) | ORAL | 0 refills | Status: DC
Start: 2019-10-02 — End: 2019-10-03
  Administered 2019-10-03 (×2): 1 via ORAL

## 2019-10-02 MED ORDER — CEFAZOLIN INJ 1GM IVP
2 g | INTRAVENOUS | 0 refills | Status: CP
Start: 2019-10-02 — End: ?
  Administered 2019-10-02 – 2019-10-03 (×2): 2 g via INTRAVENOUS

## 2019-10-02 MED ORDER — HYDROMORPHONE (PF) 2 MG/ML IJ SYRG
0 refills | Status: DC
Start: 2019-10-02 — End: 2019-10-02
  Administered 2019-10-02 (×3): 0.5 mg via INTRAVENOUS

## 2019-10-02 MED ORDER — ACETAMINOPHEN 500 MG PO TAB
1000 mg | Freq: Once | ORAL | 0 refills | Status: CP
Start: 2019-10-02 — End: ?
  Administered 2019-10-02: 14:00:00 1000 mg via ORAL

## 2019-10-02 MED ORDER — CETIRIZINE 10 MG PO TAB
10 mg | Freq: Every day | ORAL | 0 refills | Status: DC | PRN
Start: 2019-10-02 — End: 2019-10-03

## 2019-10-02 MED ORDER — INDIGOTINDISULFONATE SODIUM 8 MG/ML (0.8 %) IJ SOLN
0 refills | Status: DC
Start: 2019-10-02 — End: 2019-10-02
  Administered 2019-10-02: 18:00:00 5 mL via TOPICAL

## 2019-10-02 MED ORDER — SODIUM CHLORIDE 0.9 % IV SOLP
INTRAVENOUS | 0 refills | Status: DC
Start: 2019-10-02 — End: 2019-10-03
  Administered 2019-10-02 – 2019-10-03 (×2): 1000.000 mL via INTRAVENOUS

## 2019-10-02 MED ORDER — PANTOPRAZOLE 40 MG PO TBEC
40 mg | Freq: Every day | ORAL | 0 refills | Status: DC
Start: 2019-10-02 — End: 2019-10-03

## 2019-10-02 MED ORDER — FENTANYL CITRATE (PF) 50 MCG/ML IJ SOLN
25-50 ug | INTRAVENOUS | 0 refills | Status: DC | PRN
Start: 2019-10-02 — End: 2019-10-03

## 2019-10-02 MED ORDER — ROCURONIUM 10 MG/ML IV SOLN
INTRAVENOUS | 0 refills | Status: DC
Start: 2019-10-02 — End: 2019-10-02
  Administered 2019-10-02: 17:00:00 5 mg via INTRAVENOUS
  Administered 2019-10-02 (×3): 10 mg via INTRAVENOUS

## 2019-10-02 MED ORDER — ONDANSETRON HCL (PF) 4 MG/2 ML IJ SOLN
4 mg | INTRAVENOUS | 0 refills | Status: DC | PRN
Start: 2019-10-02 — End: 2019-10-03

## 2019-10-02 MED ORDER — ONDANSETRON HCL 4 MG PO TAB
4 mg | ORAL | 0 refills | Status: DC | PRN
Start: 2019-10-02 — End: 2019-10-03

## 2019-10-02 MED ORDER — PHENYLEPHRINE IN 0.9% NACL(PF) 1 MG/10 ML (100 MCG/ML) IV SYRG
INTRAVENOUS | 0 refills | Status: DC
Start: 2019-10-02 — End: 2019-10-02
  Administered 2019-10-02 (×3): 100 ug via INTRAVENOUS
  Administered 2019-10-02 (×3): 200 ug via INTRAVENOUS
  Administered 2019-10-02: 14:00:00 100 ug via INTRAVENOUS

## 2019-10-02 MED ORDER — POLYETHYLENE GLYCOL 3350 17 GRAM PO PWPK
1 | Freq: Two times a day (BID) | ORAL | 0 refills | Status: DC
Start: 2019-10-02 — End: 2019-10-03

## 2019-10-02 MED ORDER — DEXTRAN 70-HYPROMELLOSE (PF) 0.1-0.3 % OP DPET
0 refills | Status: DC
Start: 2019-10-02 — End: 2019-10-02
  Administered 2019-10-02: 14:00:00 2 [drp] via OPHTHALMIC

## 2019-10-02 MED ORDER — ONDANSETRON HCL (PF) 4 MG/2 ML IJ SOLN
INTRAVENOUS | 0 refills | Status: DC
Start: 2019-10-02 — End: 2019-10-02
  Administered 2019-10-02: 18:00:00 4 mg via INTRAVENOUS

## 2019-10-02 MED ORDER — PROPOFOL INJ 10 MG/ML IV VIAL
0 refills | Status: DC
Start: 2019-10-02 — End: 2019-10-02
  Administered 2019-10-02: 14:00:00 130 mg via INTRAVENOUS

## 2019-10-02 MED ADMIN — SODIUM CHLORIDE 0.9 % IV SOLP [27838]: 1000 mL | INTRAVENOUS | @ 13:00:00 | Stop: 2019-10-02 | NDC 00338004904

## 2019-10-03 MED ORDER — OXYCODONE 5 MG PO TAB
5-10 mg | ORAL_TABLET | ORAL | 0 refills | 6.00000 days | Status: AC | PRN
Start: 2019-10-03 — End: ?

## 2019-10-03 MED ORDER — TOLVAPTAN 15 MG PO TAB
60 mg | Freq: Every day | ORAL | 0 refills | Status: DC
Start: 2019-10-03 — End: 2019-10-03

## 2019-10-03 MED ORDER — POLYETHYLENE GLYCOL 3350 17 GRAM PO PWPK
17 g | Freq: Two times a day (BID) | ORAL | 1 refills | 18.00000 days | Status: AC
Start: 2019-10-03 — End: ?

## 2019-10-03 MED ORDER — SENNOSIDES-DOCUSATE SODIUM 8.6-50 MG PO TAB
1 | ORAL_TABLET | Freq: Two times a day (BID) | ORAL | 1 refills | Status: AC
Start: 2019-10-03 — End: ?

## 2019-10-03 MED ORDER — TOLVAPTAN 15 MG PO TAB
30 mg | Freq: Every day | ORAL | 0 refills | Status: DC
Start: 2019-10-03 — End: 2019-10-03

## 2019-10-04 ENCOUNTER — Encounter: Admit: 2019-10-04 | Discharge: 2019-10-04 | Payer: No Typology Code available for payment source

## 2019-10-04 DIAGNOSIS — I10 Essential (primary) hypertension: Secondary | ICD-10-CM

## 2019-10-04 DIAGNOSIS — M109 Gout, unspecified: Secondary | ICD-10-CM

## 2019-10-04 DIAGNOSIS — E785 Hyperlipidemia, unspecified: Secondary | ICD-10-CM

## 2019-10-04 DIAGNOSIS — K219 Gastro-esophageal reflux disease without esophagitis: Secondary | ICD-10-CM

## 2019-10-04 DIAGNOSIS — R0681 Apnea, not elsewhere classified: Secondary | ICD-10-CM

## 2019-10-04 DIAGNOSIS — Q612 Polycystic kidney, adult type: Secondary | ICD-10-CM

## 2019-10-04 DIAGNOSIS — R0602 Shortness of breath: Secondary | ICD-10-CM

## 2019-10-04 DIAGNOSIS — I480 Paroxysmal atrial fibrillation: Secondary | ICD-10-CM

## 2019-10-25 ENCOUNTER — Encounter: Admit: 2019-10-25 | Discharge: 2019-10-25

## 2019-10-25 ENCOUNTER — Ambulatory Visit: Admit: 2019-10-25 | Discharge: 2019-10-25

## 2019-10-25 ENCOUNTER — Ambulatory Visit: Admit: 2019-10-25 | Discharge: 2019-10-25 | Payer: BC Managed Care – PPO

## 2019-10-25 DIAGNOSIS — R0602 Shortness of breath: Secondary | ICD-10-CM

## 2019-10-25 DIAGNOSIS — I10 Essential (primary) hypertension: Secondary | ICD-10-CM

## 2019-10-25 DIAGNOSIS — Q612 Polycystic kidney, adult type: Secondary | ICD-10-CM

## 2019-10-25 DIAGNOSIS — K219 Gastro-esophageal reflux disease without esophagitis: Secondary | ICD-10-CM

## 2019-10-25 DIAGNOSIS — E785 Hyperlipidemia, unspecified: Secondary | ICD-10-CM

## 2019-10-25 DIAGNOSIS — I480 Paroxysmal atrial fibrillation: Secondary | ICD-10-CM

## 2019-10-25 DIAGNOSIS — M109 Gout, unspecified: Secondary | ICD-10-CM

## 2019-10-25 DIAGNOSIS — R0681 Apnea, not elsewhere classified: Secondary | ICD-10-CM

## 2019-10-25 LAB — BASIC METABOLIC PANEL
Lab: 1.7 mg/dL — ABNORMAL HIGH (ref 0.4–1.24)
Lab: 10 mg/dL (ref 8.5–10.6)
Lab: 108 MMOL/L (ref 98–110)
Lab: 144 MMOL/L (ref 137–147)
Lab: 23 mg/dL (ref 7–25)
Lab: 27 MMOL/L (ref 21–30)
Lab: 4.3 MMOL/L (ref 3.5–5.1)
Lab: 40 mL/min — ABNORMAL LOW (ref >60)
Lab: 48 mL/min — ABNORMAL LOW (ref >60)
Lab: 94 mg/dL (ref 70–100)
Lab: 9 (ref 3–12)

## 2019-10-30 ENCOUNTER — Encounter: Admit: 2019-10-30 | Discharge: 2019-10-30

## 2019-10-30 DIAGNOSIS — I480 Paroxysmal atrial fibrillation: Secondary | ICD-10-CM

## 2019-10-30 DIAGNOSIS — E785 Hyperlipidemia, unspecified: Secondary | ICD-10-CM

## 2019-10-30 DIAGNOSIS — M109 Gout, unspecified: Secondary | ICD-10-CM

## 2019-10-30 DIAGNOSIS — K219 Gastro-esophageal reflux disease without esophagitis: Secondary | ICD-10-CM

## 2019-10-30 DIAGNOSIS — R0681 Apnea, not elsewhere classified: Secondary | ICD-10-CM

## 2019-10-30 DIAGNOSIS — R0602 Shortness of breath: Secondary | ICD-10-CM

## 2019-10-30 DIAGNOSIS — Q612 Polycystic kidney, adult type: Secondary | ICD-10-CM

## 2019-10-30 DIAGNOSIS — I10 Essential (primary) hypertension: Secondary | ICD-10-CM

## 2019-12-14 ENCOUNTER — Encounter: Admit: 2019-12-14 | Discharge: 2019-12-14 | Payer: BC Managed Care – PPO

## 2020-03-13 ENCOUNTER — Encounter: Admit: 2020-03-13 | Discharge: 2020-03-13 | Payer: BC Managed Care – PPO

## 2020-03-13 ENCOUNTER — Ambulatory Visit: Admit: 2020-03-13 | Discharge: 2020-03-14 | Payer: BC Managed Care – PPO

## 2020-03-13 DIAGNOSIS — Q612 Polycystic kidney, adult type: Secondary | ICD-10-CM

## 2020-03-13 DIAGNOSIS — M109 Gout, unspecified: Secondary | ICD-10-CM

## 2020-03-13 DIAGNOSIS — K219 Gastro-esophageal reflux disease without esophagitis: Secondary | ICD-10-CM

## 2020-03-13 DIAGNOSIS — R0681 Apnea, not elsewhere classified: Secondary | ICD-10-CM

## 2020-03-13 DIAGNOSIS — E785 Hyperlipidemia, unspecified: Secondary | ICD-10-CM

## 2020-03-13 DIAGNOSIS — R0602 Shortness of breath: Secondary | ICD-10-CM

## 2020-03-13 DIAGNOSIS — I480 Paroxysmal atrial fibrillation: Secondary | ICD-10-CM

## 2020-03-13 DIAGNOSIS — I10 Essential (primary) hypertension: Secondary | ICD-10-CM

## 2020-03-13 DIAGNOSIS — Q613 Polycystic kidney, unspecified: Principal | ICD-10-CM

## 2020-03-13 NOTE — Patient Instructions
Please do not hesitate to call with any questions.  My nurse Dahlia Client can be reached [phone].       Return to clinic in 6 months     Continue labs every 3 months

## 2020-03-19 ENCOUNTER — Encounter: Admit: 2020-03-19 | Discharge: 2020-03-19 | Payer: BC Managed Care – PPO

## 2020-03-19 DIAGNOSIS — K219 Gastro-esophageal reflux disease without esophagitis: Secondary | ICD-10-CM

## 2020-03-19 DIAGNOSIS — Q612 Polycystic kidney, adult type: Secondary | ICD-10-CM

## 2020-03-19 DIAGNOSIS — R0681 Apnea, not elsewhere classified: Secondary | ICD-10-CM

## 2020-03-19 DIAGNOSIS — I480 Paroxysmal atrial fibrillation: Secondary | ICD-10-CM

## 2020-03-19 DIAGNOSIS — R0602 Shortness of breath: Secondary | ICD-10-CM

## 2020-03-19 DIAGNOSIS — E785 Hyperlipidemia, unspecified: Secondary | ICD-10-CM

## 2020-03-19 DIAGNOSIS — I10 Essential (primary) hypertension: Secondary | ICD-10-CM

## 2020-03-19 DIAGNOSIS — M109 Gout, unspecified: Secondary | ICD-10-CM

## 2020-03-26 ENCOUNTER — Encounter: Admit: 2020-03-26 | Discharge: 2020-03-26 | Payer: BC Managed Care – PPO

## 2020-03-26 DIAGNOSIS — Q613 Polycystic kidney, unspecified: Secondary | ICD-10-CM

## 2020-03-27 ENCOUNTER — Encounter: Admit: 2020-03-27 | Discharge: 2020-03-27 | Payer: BC Managed Care – PPO

## 2020-04-08 ENCOUNTER — Encounter: Admit: 2020-04-08 | Discharge: 2020-04-08 | Payer: BC Managed Care – PPO

## 2020-04-18 ENCOUNTER — Encounter: Admit: 2020-04-18 | Discharge: 2020-04-18 | Payer: BC Managed Care – PPO

## 2020-05-16 ENCOUNTER — Encounter: Admit: 2020-05-16 | Discharge: 2020-05-16 | Payer: BC Managed Care – PPO

## 2020-05-16 MED ORDER — JYNARQUE 60 MG (AM)/ 30 MG (PM) PO TBSQ
ORAL_TABLET | Freq: Every morning | ORAL | 11 refills | Status: AC
Start: 2020-05-16 — End: ?

## 2020-07-03 ENCOUNTER — Encounter: Admit: 2020-07-03 | Discharge: 2020-07-03 | Payer: BC Managed Care – PPO

## 2020-07-03 DIAGNOSIS — Q613 Polycystic kidney, unspecified: Secondary | ICD-10-CM

## 2020-07-03 NOTE — Telephone Encounter
Pt called Tuesday 9/21 and LVM reporting he had his labs completed and wanted to make note that he is 2 weeks past positive Covid test, no lasting effects, has some sore sinuses but already back to work.

## 2020-09-17 ENCOUNTER — Encounter: Admit: 2020-09-17 | Discharge: 2020-09-17 | Payer: BC Managed Care – PPO

## 2020-09-17 DIAGNOSIS — N183 Stage 3 chronic kidney disease, unspecified whether stage 3a or 3b CKD (HCC): Secondary | ICD-10-CM

## 2020-09-17 NOTE — Telephone Encounter
CMP reordered for Q51month for tolvaptan - faxed to [facility] per pt VM.

## 2020-09-22 ENCOUNTER — Encounter: Admit: 2020-09-22 | Discharge: 2020-09-22 | Payer: BC Managed Care – PPO

## 2020-09-22 DIAGNOSIS — N183 Stage 3 chronic kidney disease, unspecified whether stage 3a or 3b CKD (HCC): Secondary | ICD-10-CM

## 2020-09-25 ENCOUNTER — Encounter: Admit: 2020-09-25 | Discharge: 2020-09-25 | Payer: BC Managed Care – PPO

## 2020-09-25 ENCOUNTER — Ambulatory Visit: Admit: 2020-09-25 | Discharge: 2020-09-26 | Payer: BC Managed Care – PPO

## 2020-09-25 DIAGNOSIS — I480 Paroxysmal atrial fibrillation: Secondary | ICD-10-CM

## 2020-09-25 DIAGNOSIS — I10 Essential (primary) hypertension: Secondary | ICD-10-CM

## 2020-09-25 DIAGNOSIS — M109 Gout, unspecified: Secondary | ICD-10-CM

## 2020-09-25 DIAGNOSIS — Q613 Polycystic kidney, unspecified: Secondary | ICD-10-CM

## 2020-09-25 DIAGNOSIS — Q612 Polycystic kidney, adult type: Secondary | ICD-10-CM

## 2020-09-25 DIAGNOSIS — K219 Gastro-esophageal reflux disease without esophagitis: Secondary | ICD-10-CM

## 2020-09-25 DIAGNOSIS — E785 Hyperlipidemia, unspecified: Secondary | ICD-10-CM

## 2020-09-25 DIAGNOSIS — R0681 Apnea, not elsewhere classified: Secondary | ICD-10-CM

## 2020-09-25 DIAGNOSIS — R0602 Shortness of breath: Secondary | ICD-10-CM

## 2020-09-26 DIAGNOSIS — N184 Chronic kidney disease, stage 4 (severe): Principal | ICD-10-CM

## 2020-10-01 ENCOUNTER — Encounter: Admit: 2020-10-01 | Discharge: 2020-10-01 | Payer: BC Managed Care – PPO

## 2020-10-01 DIAGNOSIS — I10 Essential (primary) hypertension: Secondary | ICD-10-CM

## 2020-10-01 DIAGNOSIS — M109 Gout, unspecified: Secondary | ICD-10-CM

## 2020-10-01 DIAGNOSIS — R0681 Apnea, not elsewhere classified: Secondary | ICD-10-CM

## 2020-10-01 DIAGNOSIS — E785 Hyperlipidemia, unspecified: Secondary | ICD-10-CM

## 2020-10-01 DIAGNOSIS — Q612 Polycystic kidney, adult type: Secondary | ICD-10-CM

## 2020-10-01 DIAGNOSIS — K219 Gastro-esophageal reflux disease without esophagitis: Secondary | ICD-10-CM

## 2020-10-01 DIAGNOSIS — I480 Paroxysmal atrial fibrillation: Secondary | ICD-10-CM

## 2020-10-01 DIAGNOSIS — R0602 Shortness of breath: Secondary | ICD-10-CM

## 2020-10-07 ENCOUNTER — Encounter: Admit: 2020-10-07 | Discharge: 2020-10-07 | Payer: BC Managed Care – PPO

## 2020-10-08 ENCOUNTER — Encounter: Admit: 2020-10-08 | Discharge: 2020-10-08 | Payer: BC Managed Care – PPO

## 2020-10-08 DIAGNOSIS — N184 Chronic kidney disease, stage 4 (severe): Secondary | ICD-10-CM

## 2021-01-07 ENCOUNTER — Encounter: Admit: 2021-01-07 | Discharge: 2021-01-07 | Payer: BC Managed Care – PPO

## 2021-01-15 ENCOUNTER — Encounter: Admit: 2021-01-15 | Discharge: 2021-01-15 | Payer: BC Managed Care – PPO

## 2021-01-15 DIAGNOSIS — N1831 Stage 3a chronic kidney disease (HCC): Secondary | ICD-10-CM

## 2021-01-15 NOTE — Telephone Encounter
Nurse attempted to speak with patient about his lab results, however no one answered.    Nurse left message for patient to return call.    Christella App, RN

## 2021-01-15 NOTE — Telephone Encounter
-----   Message from Idolina Primer, MD sent at 01/14/2021  3:54 PM CDT -----  Hello   His kidney function is stable, but I need CMP every 3 months on him which is what I ordered 09/2020... I am not sure why all we got is a BMP can we see what is going with the lab and if we need to resend standing CMP every 3 months   Thanks  [employer]

## 2021-01-15 NOTE — Telephone Encounter
Nurse spoke with patient and informed him of his lab results.    Patient verbalized understanding and stated that he goes to [facility] on [address].    Nurse faxed updated standing labs.    Carlos American, RN

## 2021-03-25 ENCOUNTER — Encounter: Admit: 2021-03-25 | Discharge: 2021-03-25 | Payer: BC Managed Care – PPO

## 2021-03-26 ENCOUNTER — Encounter: Admit: 2021-03-26 | Discharge: 2021-03-26 | Payer: BC Managed Care – PPO

## 2021-03-26 ENCOUNTER — Ambulatory Visit: Admit: 2021-03-26 | Discharge: 2021-03-27 | Payer: BC Managed Care – PPO

## 2021-03-26 DIAGNOSIS — R0681 Apnea, not elsewhere classified: Secondary | ICD-10-CM

## 2021-03-26 DIAGNOSIS — R0602 Shortness of breath: Secondary | ICD-10-CM

## 2021-03-26 DIAGNOSIS — E785 Hyperlipidemia, unspecified: Secondary | ICD-10-CM

## 2021-03-26 DIAGNOSIS — Q613 Polycystic kidney, unspecified: Principal | ICD-10-CM

## 2021-03-26 DIAGNOSIS — M109 Gout, unspecified: Secondary | ICD-10-CM

## 2021-03-26 DIAGNOSIS — K219 Gastro-esophageal reflux disease without esophagitis: Secondary | ICD-10-CM

## 2021-03-26 DIAGNOSIS — I10 Essential (primary) hypertension: Secondary | ICD-10-CM

## 2021-03-26 DIAGNOSIS — Q612 Polycystic kidney, adult type: Secondary | ICD-10-CM

## 2021-03-26 DIAGNOSIS — N183 Stage 3 chronic kidney disease, unspecified whether stage 3a or 3b CKD (HCC): Secondary | ICD-10-CM

## 2021-03-26 DIAGNOSIS — I480 Paroxysmal atrial fibrillation: Secondary | ICD-10-CM

## 2021-03-26 DIAGNOSIS — I151 Hypertension secondary to other renal disorders: Secondary | ICD-10-CM

## 2021-03-26 NOTE — Progress Notes
Spoke with [facility], they have on file standing order for BMP. Advised have never faxed standing order for BMP for pt, it has always been CMP. She will send message to supervisor requesting that she contact me to confirm they have CMP on file or if they need me to send another standing order for this.

## 2021-03-26 NOTE — Patient Instructions
Please do not hesitate to call with any questions.  My nurse Jarrett Soho can be reached [phone].     Please go to the lab every 3 months       Return to clinic in 6 month s    Tolvaptan Fluid Intake Instructions   Please drink enough fluid to prevent excessive thirst throughout the daytime period and an additional 1-2 cups of water before bedtime with additional replenishment with each episode of nighttime urination to prevent dehydration  If you are sick and not able to keep fluids down please do not take tolvaptan until able to eat and drink again   If you have are having a procedure that they ask you not to eat or drink please hold the evening dose of tolvaptan prior to the procedure and not take again until able to drink again

## 2021-03-26 NOTE — Progress Notes
History          CC: PKD with CKD     HPI: Matthew Paul is a 55 y.o. male here for follow up on his PKD.  Since his last visit he had gout attack in his foot. He now has PRN colchicine which works well for him.        Medication    ? acetaminophen (TYLENOL) 325 mg tablet Take two tablets by mouth every 4 hours as needed.   ? ascorbic acid 1,000 mg tablet Take 1 tablet by mouth daily.   ? aspirin 81 mg chewable tablet Chew 81 mg by mouth daily. Take with food.   ? atorvastatin (LIPITOR) 20 mg tablet Take 20 mg by mouth daily.   ? calcium carbonate (CALCIUM 600 PO) Take 1 tablet by mouth daily.   ? cetirizine (ZYRTEC) 10 mg tablet Take 10 mg by mouth daily as needed for Allergy symptoms. Takes either cetirizine or fexofenadine.   ? cholecalciferol (VITAMIN D-3) 400 unit tab tablet Take 400 Units by mouth daily.   ? colchicine 0.6 mg tablet Take 0.6 mg by mouth as Needed (for gout flares).   ? fexofenadine HCl (ALLEGRA PO) Take 1 tablet by mouth daily as needed. Takes either cetirizine or fexofenadine.   ? FLAXSEED OIL PO Take 1 tablet by mouth daily.   ? lisinopril (PRINIVIL; ZESTRIL) 20 mg tablet Take 1 tablet by mouth daily.   ? omeprazole DR (PRILOSEC) 40 mg capsule Take 40 mg by mouth daily.   ? oxyCODONE (ROXICODONE) 5 mg tablet Take one tablet to two tablets by mouth every 4 hours as needed   ? polyethylene glycol 3350 (MIRALAX) 17 g packet Take one packet by mouth twice daily.   ? senna/docusate (SENOKOT-S) 8.6/50 mg tablet Take one tablet by mouth twice daily.   ? tolvaptan (polycys kidney dis) (JYNARQUE) 60 mg (AM)/ 30 mg (PM) TbSQ Take sixty mg by mouth every morning AND thirty mg daily before dinner.   ? vitamin E 400 unit capsule Take 400 Units by mouth daily.   ? vitamins, B complex tab Take 2 tablets by mouth daily.              Physical        Vitals:    03/26/21 1302 03/26/21 1306   BP: 109/80 (P) 95/64   BP Source: Arm, Right Upper (P) Arm, Right Upper   Pulse: 90    PainSc: Zero Weight: 127.8 kg (281 lb 12.8 oz)    Height: 200.7 cm (6' 7)      Body mass index is 31.75 kg/m?. 135/90 on recheck sitting.     Gen: Alert and Oriented, No Acute Distress   HEENT: Sclera normal; MMM    CV: no JVD, S1 and S2 normal, no rubs, murmurs or gallops   Pulm: Clear to Auscultation bilateral   Neuro: Grossly normal, moving all extremities, speech intact  Ext: no edema, or cyanosis   Abd: kidneys palpable bilaterally   Skin: no rash      Assessment and Plan         Matthew Paul is a 55 y.o. 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 Approximate date of initial diagnosis: 2011  ? 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 Confirmed by imaging: MRI 2014  - continue tolvaptan at 60mg /30mg   - baseline  Cr 1.7  - Cr 03/2021 at 1.75   - doing well on tolvaptan dose, will continue to monitor LFTs and for side effects   - right kidney cysts decortication 05/2019 left kidney cysts decortication 09/2019   - last LFTs done 03/2021 normal   - continue CMP every 3 months      HTN  - controlled continue current regimen      Anemia   - hb at goal 03/2021    Mineral bone disease   - PTH, phos and vitamin D at goal 04/2017    RTC 6 months   Repeat labs 2-3 weeks           [name], MD   Pager [phone]       Patient Instructions   Please do not hesitate to call with any questions.  My nurse Dahlia Client can be reached [phone].     Please go to the lab every 3 months       Return to clinic in 6 month s    Tolvaptan Fluid Intake Instructions   Please drink enough fluid to prevent excessive thirst throughout the daytime period and an additional 1-2 cups of water before bedtime with additional replenishment with each episode of nighttime urination to prevent dehydration  If you are sick and not able to keep fluids down please do not take tolvaptan until able to eat and drink again   If you have are having a procedure that they ask you not to eat or drink please hold the evening dose of tolvaptan prior to the procedure and not take again until able to drink again

## 2021-03-31 ENCOUNTER — Encounter: Admit: 2021-03-31 | Discharge: 2021-03-31 | Payer: BC Managed Care – PPO

## 2021-03-31 MED ORDER — JYNARQUE 60 MG (AM)/ 30 MG (PM) PO TBSQ
ORAL_TABLET | Freq: Every morning | ORAL | 11 refills | 28.00000 days | Status: AC
Start: 2021-03-31 — End: ?

## 2021-04-10 ENCOUNTER — Encounter: Admit: 2021-04-10 | Discharge: 2021-04-10 | Payer: BC Managed Care – PPO

## 2021-04-17 ENCOUNTER — Encounter: Admit: 2021-04-17 | Discharge: 2021-04-17 | Payer: BC Managed Care – PPO

## 2021-04-17 NOTE — Progress Notes
Faxed clinical info for prior auth to [employer] fax # [phone].

## 2021-04-22 ENCOUNTER — Encounter: Admit: 2021-04-22 | Discharge: 2021-04-22 | Payer: BC Managed Care – PPO

## 2021-04-23 ENCOUNTER — Encounter: Admit: 2021-04-23 | Discharge: 2021-04-23 | Payer: BC Managed Care – PPO

## 2021-04-23 DIAGNOSIS — Z006 Encounter for examination for normal comparison and control in clinical research program: Secondary | ICD-10-CM

## 2021-04-23 DIAGNOSIS — Q612 Polycystic kidney, adult type: Secondary | ICD-10-CM

## 2021-04-23 NOTE — Research Notes
Research Informed Consent Note    NAME: Thinh Beutler             MRN: [id number]             DOB:Feb 15, 1966          AGE: 55 y.o.    IRB Number: [id number]  Consent Approval Dates: 04/02/2021 - 08/04/2021    Clinical research participation and research nature of the trial were discussed with subject. Subject was informed that study is voluntary and  he may withdraw consent at any time for any reason by notifying study team.  Study purpose, procedures, benefits, risks and duration of the study, confidentiality information and compensation were discussed. Subject verbalized understanding.    Subject was provided time to review the consent form.  All questions asked were answered.  Subject voiced desire to participate in the study and signed the informed consent form without coercion and undue influence.  A copy of the signed consent was given to the subject as well as contact information for the study team.  A copy of the consent form was e-mailed to [employer] ([employer]) for scanning into the subject's medical record.    No research procedures took place prior to consenting.

## 2021-07-16 ENCOUNTER — Encounter: Admit: 2021-07-16 | Discharge: 2021-07-16 | Payer: BC Managed Care – PPO

## 2021-07-16 DIAGNOSIS — N1831 Stage 3a chronic kidney disease (HCC): Secondary | ICD-10-CM

## 2021-07-23 ENCOUNTER — Encounter: Admit: 2021-07-23 | Discharge: 2021-07-23 | Payer: BC Managed Care – PPO

## 2021-07-23 DIAGNOSIS — Q613 Polycystic kidney, unspecified: Secondary | ICD-10-CM

## 2021-07-23 NOTE — Telephone Encounter
Pt verbalized understanding of results/recommendations. Lab order placed and faxed to [facility].

## 2021-07-23 NOTE — Telephone Encounter
-----   Message from Idolina Primer, MD sent at 07/20/2021  9:17 AM CDT -----  Please let him know his LFTS are normal but his K is up and Na is on the high side of normal.  Please have him do a repeat BMP next week   Thanks  Earlie Raveling

## 2021-07-31 ENCOUNTER — Encounter: Admit: 2021-07-31 | Discharge: 2021-07-31 | Payer: BC Managed Care – PPO

## 2021-07-31 DIAGNOSIS — Q613 Polycystic kidney, unspecified: Secondary | ICD-10-CM

## 2021-07-31 DIAGNOSIS — Z5181 Encounter for therapeutic drug level monitoring: Secondary | ICD-10-CM

## 2021-07-31 NOTE — Progress Notes
[facility] called. Orders for standing CMP have expired. New orders placed and will fax to [phone] once signed.

## 2021-08-20 ENCOUNTER — Encounter: Admit: 2021-08-20 | Discharge: 2021-08-20 | Payer: BC Managed Care – PPO

## 2021-08-20 DIAGNOSIS — Q613 Polycystic kidney, unspecified: Secondary | ICD-10-CM

## 2021-10-09 ENCOUNTER — Encounter: Admit: 2021-10-09 | Discharge: 2021-10-09 | Payer: BC Managed Care – PPO

## 2021-10-09 NOTE — Telephone Encounter
Pt LVM, went to the lab to have his labs drawn but concerned they drew the wrong thing again. Call to [employer], specimen hasn't been picked up yet so unable to see if test they drew was correct. Will have to try to call back later to double check.

## 2021-10-15 ENCOUNTER — Encounter: Admit: 2021-10-15 | Discharge: 2021-10-15 | Payer: BC Managed Care – PPO

## 2021-10-15 DIAGNOSIS — Z5181 Encounter for therapeutic drug level monitoring: Secondary | ICD-10-CM

## 2021-10-15 DIAGNOSIS — Q613 Polycystic kidney, unspecified: Secondary | ICD-10-CM

## 2021-11-19 ENCOUNTER — Encounter: Admit: 2021-11-19 | Discharge: 2021-11-19 | Payer: BC Managed Care – PPO

## 2021-11-19 ENCOUNTER — Ambulatory Visit: Admit: 2021-11-19 | Discharge: 2021-11-19 | Payer: BC Managed Care – PPO

## 2021-11-19 VITALS — BP 149/86 | HR 103 | Ht 79.0 in | Wt 279.0 lb

## 2021-11-19 VITALS — BP 110/74 | HR 128

## 2021-11-19 DIAGNOSIS — K219 Gastro-esophageal reflux disease without esophagitis: Secondary | ICD-10-CM

## 2021-11-19 DIAGNOSIS — Q612 Polycystic kidney, adult type: Secondary | ICD-10-CM

## 2021-11-19 DIAGNOSIS — R0602 Shortness of breath: Secondary | ICD-10-CM

## 2021-11-19 DIAGNOSIS — R0681 Apnea, not elsewhere classified: Secondary | ICD-10-CM

## 2021-11-19 DIAGNOSIS — I480 Paroxysmal atrial fibrillation: Secondary | ICD-10-CM

## 2021-11-19 DIAGNOSIS — N183 Stage 3 chronic kidney disease, unspecified whether stage 3a or 3b CKD (HCC): Secondary | ICD-10-CM

## 2021-11-19 DIAGNOSIS — I10 Essential (primary) hypertension: Secondary | ICD-10-CM

## 2021-11-19 DIAGNOSIS — M109 Gout, unspecified: Secondary | ICD-10-CM

## 2021-11-19 DIAGNOSIS — E785 Hyperlipidemia, unspecified: Secondary | ICD-10-CM

## 2021-11-19 DIAGNOSIS — Q613 Polycystic kidney, unspecified: Principal | ICD-10-CM

## 2021-11-19 NOTE — Patient Instructions
Please do not hesitate to call with any questions.  My nurse Jarrett Soho can be reached [phone].     Please go to the lab every months    Return to clinic in 6 months

## 2021-11-19 NOTE — Progress Notes
History          CC: PKD with CKD     HPI: Matthew Paul is a 56 y.o. male here for follow up on his PKD.  He is urinating about 3 times a night.  He is doing well with his water intake.      Medication    ? acetaminophen (TYLENOL) 325 mg tablet Take two tablets by mouth every 4 hours as needed.   ? ascorbic acid 1,000 mg tablet Take 1 tablet by mouth daily.   ? aspirin 81 mg chewable tablet Chew 81 mg by mouth daily. Take with food.   ? atorvastatin (LIPITOR) 20 mg tablet Take 20 mg by mouth daily.   ? calcium carbonate (CALCIUM 600 PO) Take 1 tablet by mouth daily.   ? cetirizine (ZYRTEC) 10 mg tablet Take 10 mg by mouth daily as needed for Allergy symptoms. Takes either cetirizine or fexofenadine.   ? cholecalciferol (VITAMIN D-3) 400 unit tab tablet Take 400 Units by mouth daily.   ? colchicine 0.6 mg tablet Take 0.6 mg by mouth as Needed (for gout flares).   ? fexofenadine HCl (ALLEGRA PO) Take 1 tablet by mouth daily as needed. Takes either cetirizine or fexofenadine.   ? FLAXSEED OIL PO Take 1 tablet by mouth daily.   ? JYNARQUE 60 mg (AM)/ 30 mg (PM) TbSQ Take 60 mg by mouth every morning, and take 30 mg by mouth 8 hours later every day   ? lisinopril (PRINIVIL; ZESTRIL) 20 mg tablet Take 1 tablet by mouth daily.   ? omeprazole DR (PRILOSEC) 40 mg capsule Take 40 mg by mouth daily.   ? oxyCODONE (ROXICODONE) 5 mg tablet Take one tablet to two tablets by mouth every 4 hours as needed   ? polyethylene glycol 3350 (MIRALAX) 17 g packet Take one packet by mouth twice daily.   ? senna/docusate (SENOKOT-S) 8.6/50 mg tablet Take one tablet by mouth twice daily.   ? vitamin E 400 unit capsule Take 400 Units by mouth daily.   ? vitamins, B complex tab Take 2 tablets by mouth daily.              Physical        Vitals:    11/19/21 1247 11/19/21 1250   BP: (!) 149/86 110/74   BP Source: Arm, Left Upper Arm, Left Upper   Pulse: 103 (!) 128   PainSc: Zero    Weight: 126.6 kg (279 lb)    Height: 200.7 cm (6' 7)      Body mass index is 31.43 kg/m?. 135/90 on recheck sitting.     Gen: Alert and Oriented, No Acute Distress   HEENT: Sclera normal; MMM    CV: no JVD, S1 and S2 normal, no rubs, murmurs or gallops   Pulm: Clear to Auscultation bilateral   Neuro: Grossly normal, moving all extremities, speech intact  Ext: no edema, or cyanosis   Abd: kidneys palpable bilaterally   Skin: no rash      Assessment and Plan         Matthew Paul is a 56 y.o. 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 Approximate date of initial diagnosis: 2011  ? 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 Confirmed by imaging: MRI 2014  - continue tolvaptan at 60mg /30mg   - baseline Cr 1.7  -  Cr 03/2021 at 1.75   - doing well on tolvaptan dose, will continue to monitor LFTs and for side effects   - right kidney cysts decortication 05/2019 left kidney cysts decortication 09/2019   - last LFTs done 09/2021 normal   - continue CMP every 3 months   - interested in doing the regulus study ok to pause tolvaptan for the 4 months of the trial and will plan on resuming after the trial      HTN  - controlled continue current regimen      Anemia   - hb at goal 03/2021    Mineral bone disease   - PTH, phos and vitamin D at goal 04/2017             Matthew Sheffield, MD   Pager [phone]       Patient Instructions   Please do not hesitate to call with any questions.  My nurse Dahlia Client can be reached [phone].     Please go to the lab every months    Return to clinic in 6 months

## 2021-12-01 ENCOUNTER — Encounter: Admit: 2021-12-01 | Discharge: 2021-12-01 | Payer: BC Managed Care – PPO

## 2021-12-01 NOTE — Research Notes
Research Informed Consent Note    NAME: Matthew Paul             MRN: [id number]             DOB:[birth year]          AGE: 56 y.o.    IRB Number:[id number]   Consent Approval Dates: 07 Dec. 2022    Clinical research participation and research nature of the trial were discussed with subject. The subject was alert and oriented during consent discussion. Subject was informed that study is voluntary and  he may withdraw consent at any time for any reason by notifying study team.  Study purpose, procedures, benefits, risks and duration of the study, confidentiality information and compensation were discussed.  Alternatives to participation were discussed per consent form.  Subject verbalized understanding.    Subject was provided time to review the consent form.  All questions asked were answered.  Subject voiced desire to participate in the study and signed the informed consent form without coercion and undue influence.  A copy of the signed consent was given to the subject as well as contact information for the study team.  A copy of the consent form was e-mailed to [employer] ([employer]) for scanning into the subject's medical record.    No research procedures took place prior to consenting.

## 2021-12-11 ENCOUNTER — Encounter: Admit: 2021-12-11 | Discharge: 2021-12-11 | Payer: BC Managed Care – PPO

## 2021-12-11 NOTE — Progress Notes
Staff e-mail re: pt Matthew Paul.     From: Hoy Finlay   Sent: Tuesday, December 01, 2021 2:30 PM  To: Earlie Raveling; Syliva Overman; Eldridge Scot; Heron Nay   Subject: notification of Matthew Paul D/C and study enrollment, G. Lampkins  Just wanted to let you know that Matthew Paul consented for the [facility] study today and so he is stopping Matthew Paul today and for the next 144 days.  At that point he may decide to restart it.  He was not sure what needs to be done with the insurance/patient assistance program.  He also wanted you to know that he would not be doing the labs for a while.  I will document study enrollment in 02 but cannot chart anything related to meds.  Matthew Paul                   1966-04-25                              MRN: [id number]  Let me know if anything else is needed.  Thanks  [employer]

## 2021-12-24 ENCOUNTER — Encounter: Admit: 2021-12-24 | Discharge: 2021-12-24 | Payer: BC Managed Care – PPO

## 2022-02-10 ENCOUNTER — Encounter: Admit: 2022-02-10 | Discharge: 2022-02-10 | Payer: BC Managed Care – PPO

## 2022-02-10 DIAGNOSIS — E790 Hyperuricemia without signs of inflammatory arthritis and tophaceous disease: Secondary | ICD-10-CM

## 2022-02-10 NOTE — Telephone Encounter
Called pt and LVM to see if he is having a flare currently. Requested return call.    Matthew Paul - uric acid control, something for gout since he is having gout flares? He mentioned that Tye Maryland had texted you about it.  Sent 13:25  Read 14:07    Yep have him do a Uric acid level is having a flare right now if so do a medrol dose pack and once I have uric acid level will start allopurinol to try and prevent flares   Earlie Raveling- Nephrologist  14:08

## 2022-02-11 ENCOUNTER — Encounter: Admit: 2022-02-11 | Discharge: 2022-02-11 | Payer: BC Managed Care – PPO

## 2022-02-11 DIAGNOSIS — Z006 Encounter for examination for normal comparison and control in clinical research program: Secondary | ICD-10-CM

## 2022-02-12 ENCOUNTER — Encounter: Admit: 2022-02-12 | Discharge: 2022-02-12 | Payer: BC Managed Care – PPO

## 2022-02-12 ENCOUNTER — Ambulatory Visit: Admit: 2022-02-12 | Discharge: 2022-02-12 | Payer: BC Managed Care – PPO

## 2022-02-12 DIAGNOSIS — Z006 Encounter for examination for normal comparison and control in clinical research program: Secondary | ICD-10-CM

## 2022-02-12 NOTE — Progress Notes
Ambulatory (External) Cardiac Monitor Enrollment Record     Placement Location: Home Enrollment  Clinic Location: [facility]  Vendor: [employer] ([employer])  Mobile Cardiac Telemetry (MCOT/MCT)?: No  Duration of Monitor (in days): 5  Monitor Diagnosis: -- (Z00.6)  No data recordedOrdering Provider: Reeves Dam, MD  AMB Monitor Serial Number: home      Start Time and Date: 02/12/22 2:58 PM   Patient Name: Matthew Paul  DOB: [birth year]  MRN: [id number]  Sex: male  Mobile Phone Number: [phone] (mobile)  Home Phone Number: [phone]  Patient Address: [place] [address]  Insurance Coverage: [place] ID: [id number]  Insurance Group #: [id number]  Insurance Subscriber: MONTFORD, BARG  Implanted Cardiac Device Information: No results found for: [id number]      Patient instructed to contact company phone number on the monitor box with questions regarding billing, placement, troubleshooting.     [place]    ____________________________________________________________    Clinic Staff:  In Follow-up, send chart upon closing encounter to [facility]    [place] Ambulatory Monitoring Team:  1. Schedule on appropriate template and check-in.   Clinic Placement Schedule on clinic location [facility] schedule   Home Enrollment Schedule on Home Enrollment schedule ([facility])   Given to patient in clinic for self-placement Schedule on Home Enrollment schedule ([facility])   Inpatient Schedule on [facility] AMBULATORY MONITORING template   2. Please enroll with appropriate vendor.

## 2022-02-15 ENCOUNTER — Encounter: Admit: 2022-02-15 | Discharge: 2022-02-15 | Payer: BC Managed Care – PPO

## 2022-02-15 NOTE — Telephone Encounter
Berton Lan from [facility] nephrology clinic LVM x 2 (5 min apart) reguarding Mr. Hazen and the holter monitor  She states she spoke with someone on Friday and they would have manufact send out holter  Returned Cathy's voice mail.. she is at [facility] office currently  Explained to her she needs ambulatory care monitor staff   Review of chart.  I informed her it looks like it was just ordered 02/12/22 (Friday) at 1458pm and usually the companies take 72 hrs to have it mailed to the pt.  She stated her MD is asking and wants it today if possible.    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 Not at work  Informed her to ask for Ambulatory monitor staff or Steva Colder our Freight forwarder.

## 2022-03-02 ENCOUNTER — Encounter: Admit: 2022-03-02 | Discharge: 2022-03-02 | Payer: BC Managed Care – PPO

## 2022-03-02 NOTE — Telephone Encounter
Matthew Paul from [place], nephrology  They are ' urgently looking for his holter monitor event that was ordered on 02/12/22, they can't find results in his chart    Per chart review holter results are in his chart  [place] back and her MD was reviewing the findings/results currently

## 2022-03-10 ENCOUNTER — Encounter: Admit: 2022-03-10 | Discharge: 2022-03-10 | Payer: BC Managed Care – PPO

## 2022-03-10 MED ORDER — JYNARQUE 60 MG (AM)/ 30 MG (PM) PO TBSQ
ORAL_TABLET | 11 refills
Start: 2022-03-10 — End: ?

## 2022-03-11 ENCOUNTER — Encounter: Admit: 2022-03-11 | Discharge: 2022-03-11 | Payer: BC Managed Care – PPO

## 2022-03-11 MED ORDER — ALLOPURINOL 100 MG PO TAB
100 mg | ORAL_TABLET | Freq: Every day | ORAL | 1 refills | 45.00000 days | Status: AC
Start: 2022-03-11 — End: ?

## 2022-03-11 NOTE — Telephone Encounter
-----   Message from Idolina Primer, MD sent at 03/10/2022  2:00 PM CDT -----  Hello   He can do allopurinol 100mg  once a day, does he have recent CMP I need one to refill his tolvaptan   thanks  Marianna Fuss   ----- Message -----  From: Syliva Overman, LPN  Sent: 7/85/8850  10:55 AM CDT  To: Idolina Primer, MD    Uric acid level 6.5 on 5/5 at his PCP office. Tye Maryland said she drew another uric acid level but I wasn't able to see the result that she scanned to me so I asked her for another copy to be sent to me a different way. Does he need allopurinol?  Thanks!  Jarrett Soho

## 2022-03-11 NOTE — Telephone Encounter
Forwarded provider CMP results from labs drawn with study nurse a few weeks ago.   MyChart message sent to pt re: med and script sent to local [facility] pharmacy.

## 2022-03-12 ENCOUNTER — Encounter: Admit: 2022-03-12 | Discharge: 2022-03-12 | Payer: BC Managed Care – PPO

## 2022-03-12 MED ORDER — JYNARQUE 60 MG (AM)/ 30 MG (PM) PO TBSQ
ORAL_TABLET | 11 refills
Start: 2022-03-12 — End: ?

## 2022-03-12 NOTE — Telephone Encounter
Nurse received a voicemail wanting to know if his upcoming appointment with the provider could be combined with his drug study appointment.    Per patient, he has an upcoming drug study appointment on Monday with Olena Leatherwood and the provider will be there as well, and wanted to know if they both could see him at the same time instead of coming back and seeing Dr. Cephas Darby on 6/15.    Routing to provider for review.    Carlos American, RN

## 2022-03-12 NOTE — Telephone Encounter
Pharmacy requesting a refill of: Jynarque  Last filled: 03/31/2021  LOV: 11/19/2021    This is a non-standing order. Routing to Dr. Cephas Darby for approval.     Carlos American, RN

## 2022-04-20 ENCOUNTER — Encounter: Admit: 2022-04-20 | Discharge: 2022-04-20 | Payer: BC Managed Care – PPO

## 2022-04-20 MED ORDER — CIPROFLOXACIN HCL 500 MG PO TAB
500 mg | ORAL_TABLET | Freq: Two times a day (BID) | ORAL | 0 refills | 10.00000 days | Status: AC
Start: 2022-04-20 — End: ?

## 2022-04-20 NOTE — Telephone Encounter
Per Dr. Cephas Darby, pt to get Cipro 500mg  BID x 2 weeks.  Pt notified, verb understanding. Script sent to preferred local pharmacy.

## 2022-04-20 NOTE — Telephone Encounter
Pt called in. He went to ER on Sunday. Advised had rec'd message from [facility] study coordinator about his visit and that Dr. Cephas Darby had potentially wanted to start him on Cipro. Pt states he is still experiencing flank pain however no blood in urine, no fevers etc. Advised have message sent to Dr. Cephas Darby to see if plan is still for pt to start Cipro and what dose and for how long - did advise him normally is at least for 4 weeks for suspected cyst infections but will f/u with him when I hear back from her. Pt verbalized understanding.

## 2022-04-22 ENCOUNTER — Encounter: Admit: 2022-04-22 | Discharge: 2022-04-22 | Payer: BC Managed Care – PPO

## 2022-06-09 ENCOUNTER — Encounter: Admit: 2022-06-09 | Discharge: 2022-06-09 | Payer: BC Managed Care – PPO

## 2022-09-04 ENCOUNTER — Encounter: Admit: 2022-09-04 | Discharge: 2022-09-04 | Payer: BC Managed Care – PPO

## 2022-09-04 MED ORDER — ALLOPURINOL 100 MG PO TAB
100 mg | ORAL_TABLET | Freq: Every day | ORAL | 1 refills
Start: 2022-09-04 — End: ?

## 2022-09-23 ENCOUNTER — Encounter: Admit: 2022-09-23 | Discharge: 2022-09-23 | Payer: BC Managed Care – PPO

## 2022-09-23 DIAGNOSIS — Q613 Polycystic kidney, unspecified: Secondary | ICD-10-CM

## 2022-10-02 ENCOUNTER — Encounter: Admit: 2022-10-02 | Discharge: 2022-10-02 | Payer: BC Managed Care – PPO

## 2022-10-05 ENCOUNTER — Encounter: Admit: 2022-10-05 | Discharge: 2022-10-05 | Payer: BC Managed Care – PPO

## 2022-10-05 NOTE — Telephone Encounter
Rec'd VM from pt calling about labs prior to appt with Dr. Cephas Darby on Thursday, 12/28. Pt is overdue for labs and there is a standing CMP order placed.  Returned pt call, confirmed he is still going to [name] location [address]. Order faxed, pt will have done today.

## 2022-10-07 ENCOUNTER — Ambulatory Visit: Admit: 2022-10-07 | Discharge: 2022-10-07 | Payer: BC Managed Care – PPO

## 2022-10-07 ENCOUNTER — Encounter: Admit: 2022-10-07 | Discharge: 2022-10-07 | Payer: BC Managed Care – PPO

## 2022-10-07 DIAGNOSIS — Z5181 Encounter for therapeutic drug level monitoring: Secondary | ICD-10-CM

## 2022-10-07 DIAGNOSIS — R0602 Shortness of breath: Secondary | ICD-10-CM

## 2022-10-07 DIAGNOSIS — E785 Hyperlipidemia, unspecified: Secondary | ICD-10-CM

## 2022-10-07 DIAGNOSIS — I10 Essential (primary) hypertension: Secondary | ICD-10-CM

## 2022-10-07 DIAGNOSIS — N1832 Stage 3b chronic kidney disease (HCC): Secondary | ICD-10-CM

## 2022-10-07 DIAGNOSIS — I480 Paroxysmal atrial fibrillation: Secondary | ICD-10-CM

## 2022-10-07 DIAGNOSIS — M109 Gout, unspecified: Secondary | ICD-10-CM

## 2022-10-07 DIAGNOSIS — Q613 Polycystic kidney, unspecified: Secondary | ICD-10-CM

## 2022-10-07 DIAGNOSIS — R0681 Apnea, not elsewhere classified: Secondary | ICD-10-CM

## 2022-10-07 DIAGNOSIS — K219 Gastro-esophageal reflux disease without esophagitis: Secondary | ICD-10-CM

## 2022-10-07 DIAGNOSIS — Q612 Polycystic kidney, adult type: Secondary | ICD-10-CM

## 2022-10-07 DIAGNOSIS — I151 Hypertension secondary to other renal disorders: Secondary | ICD-10-CM

## 2022-10-07 LAB — COMPREHENSIVE METABOLIC PANEL
ALBUMIN: 3.9 g/dL (ref 3.5–5.0)
ALK PHOSPHATASE: 69 U/L (ref 25–110)
ALT: 12 U/L (ref 7–56)
ANION GAP: 9 (ref 3–12)
AST: 14 U/L (ref 7–40)
BLD UREA NITROGEN: 26 mg/dL — ABNORMAL HIGH (ref 7–25)
CALCIUM: 10 mg/dL (ref 8.5–10.6)
CHLORIDE: 109 MMOL/L (ref 98–110)
CO2: 25 MMOL/L (ref 21–30)
CREATININE: 2 mg/dL — ABNORMAL HIGH (ref 0.4–1.24)
EGFR: 37 mL/min — ABNORMAL LOW (ref >60)
GLUCOSE,PANEL: 85 mg/dL (ref 70–100)
POTASSIUM: 4.4 MMOL/L (ref 3.5–5.1)
SODIUM: 143 MMOL/L (ref 137–147)
TOTAL BILIRUBIN: 0.5 mg/dL (ref 0.3–1.2)
TOTAL PROTEIN: 6.6 g/dL (ref 6.0–8.0)

## 2022-10-07 NOTE — Progress Notes
History          CC: PKD with CKD     HPI: Matthew Paul is a 56 y.o. male here for follow up on his PKD.  He is urinating about 3 times a night.  He is doing well with his water intake.      Medication     acetaminophen (TYLENOL) 325 mg tablet Take two tablets by mouth every 4 hours as needed.    allopurinoL (ZYLOPRIM) 100 mg tablet TAKE ONE TABLET BY MOUTH DAILY. TAKE WITH FOOD.    ascorbic acid 1,000 mg tablet Take one tablet by mouth daily.    aspirin 81 mg chewable tablet Chew one tablet by mouth daily. Take with food.    atorvastatin (LIPITOR) 20 mg tablet Take one tablet by mouth daily.    calcium carbonate (CALCIUM 600 PO) Take 1 tablet by mouth daily.    cetirizine (ZYRTEC) 10 mg tablet Take one tablet by mouth daily as needed for Allergy symptoms. Takes either cetirizine or fexofenadine.    cholecalciferol (VITAMIN D-3) 400 unit tab tablet Take one tablet by mouth daily.    colchicine 0.6 mg tablet Take one tablet by mouth as Needed (for gout flares).    fexofenadine HCl (ALLEGRA PO) Take 1 tablet by mouth daily as needed. Takes either cetirizine or fexofenadine.    FLAXSEED OIL PO Take 1 tablet by mouth daily.    JYNARQUE 60 mg (AM)/ 30 mg (PM) TbSQ Take 60 mg by mouth every morning, and take 30 mg by mouth 8 hours later every day    lisinopril (PRINIVIL; ZESTRIL) 20 mg tablet Take one tablet by mouth daily.    omeprazole DR (PRILOSEC) 40 mg capsule Take one capsule by mouth daily.    oxyCODONE (ROXICODONE) 5 mg tablet Take one tablet to two tablets by mouth every 4 hours as needed    polyethylene glycol 3350 (MIRALAX) 17 g packet Take one packet by mouth twice daily.    senna/docusate (SENOKOT-S) 8.6/50 mg tablet Take one tablet by mouth twice daily.    vitamin E 400 unit capsule Take one capsule by mouth daily.    vitamins, B complex tab Take two tablets by mouth daily.              Physical        Vitals:    10/07/22 1255 10/07/22 1258   BP: 124/82 (P) 107/77  Comment: asymptomatic   BP Source: Arm, Left Upper (P) Arm, Left Upper   Pulse: 105 (P) 118   PainSc: Zero    Weight: 129.6 kg (285 lb 12.8 oz)    Height: 200.7 cm (6' 7)      Body mass index is 32.2 kg/m?. 135/90 on recheck sitting.     Gen: Alert and Oriented, No Acute Distress   HEENT: Sclera normal; MMM    CV: no JVD, S1 and S2 normal, no rubs, murmurs or gallops   Pulm: Clear to Auscultation bilateral   Neuro: Grossly normal, moving all extremities, speech intact  Ext: no edema, or cyanosis   Abd: kidneys palpable bilaterally   Skin: no rash      Assessment and Plan         Matthew Paul is a 56 y.o. 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  Confirmed by imaging: MRI 2014  - continue tolvaptan at 60mg /30mg   - baseline Cr 1.75-2   - doing well on tolvaptan dose, will continue to monitor LFTs and for side effects   - right kidney cysts decortication 05/2019 left kidney cysts decortication 09/2019   - last LFTs done 09/2022 normal   - Cr at a baseline on labs done today   - continue CMP every 3 months   - restarted tovlaptan in June 2023 after regulus study   - continue tolvaptan     HTN  - controlled continue current regimen      Anemia   - hb at goal 04/2022    Mineral bone disease   - PTH, phos and vitamin D at goal 04/2017 will get repeat PTH, phos and vitamin D when he does labs next              [name], MD   Pager [phone]       Patient Instructions   Please do not hesitate to call with any questions.  My nurse Dahlia Client can be reached [phone].     Please go to the lab every 3 months     Return to clinic in 6 months     Tolvaptan Fluid Intake Instructions   Please drink enough fluid to prevent excessive thirst throughout the daytime period and an additional 1-2 cups of water before bedtime with additional replenishment with each episode of nighttime urination to prevent dehydration  If you are sick and not able to keep fluids down please do not take tolvaptan until able to eat and drink again   If you have are having a procedure that they ask you not to eat or drink please hold the evening dose of tolvaptan prior to the procedure and not take again until able to drink again

## 2022-12-28 ENCOUNTER — Encounter: Admit: 2022-12-28 | Discharge: 2022-12-28 | Payer: BC Managed Care – PPO

## 2022-12-28 DIAGNOSIS — Q612 Polycystic kidney, adult type: Secondary | ICD-10-CM

## 2022-12-28 NOTE — Progress Notes
Pt needing updated CMP orders sent to [facility] as he is due for repeat lab check. Orders placed and faxed to [facility] with pt and notified also sent orders for PTH and Vit D, he had labs drawn already so only one tube but can get drawn at next lab visit.

## 2023-02-14 ENCOUNTER — Encounter: Admit: 2023-02-14 | Discharge: 2023-02-14 | Payer: BC Managed Care – PPO

## 2023-02-14 MED ORDER — JYNARQUE 60 MG (AM)/ 30 MG (PM) PO TBSQ
ORAL_TABLET | 4 refills
Start: 2023-02-14 — End: ?

## 2023-02-28 ENCOUNTER — Encounter: Admit: 2023-02-28 | Discharge: 2023-02-28 | Payer: BC Managed Care – PPO

## 2023-02-28 MED ORDER — ALLOPURINOL 100 MG PO TAB
100 mg | ORAL_TABLET | Freq: Every day | ORAL | 1 refills | 45.00000 days | Status: AC
Start: 2023-02-28 — End: ?

## 2023-04-01 ENCOUNTER — Encounter: Admit: 2023-04-01 | Discharge: 2023-04-01 | Payer: BC Managed Care – PPO

## 2023-04-01 ENCOUNTER — Ambulatory Visit: Admit: 2023-04-01 | Discharge: 2023-04-01 | Payer: BC Managed Care – PPO

## 2023-04-01 DIAGNOSIS — N1832 Stage 3b chronic kidney disease (HCC): Secondary | ICD-10-CM

## 2023-04-01 DIAGNOSIS — Q613 Polycystic kidney, unspecified: Secondary | ICD-10-CM

## 2023-04-01 LAB — COMPREHENSIVE METABOLIC PANEL
ALBUMIN: 3.8 g/dL (ref 3.5–5.0)
ALK PHOSPHATASE: 82 U/L (ref 25–110)
ALT: 18 U/L (ref 7–56)
ANION GAP: 10 (ref 3–12)
AST: 17 U/L (ref 7–40)
BLD UREA NITROGEN: 21 mg/dL (ref 7–25)
CALCIUM: 10 mg/dL (ref 8.5–10.6)
CHLORIDE: 109 MMOL/L (ref 98–110)
CO2: 23 MMOL/L (ref 21–30)
CREATININE: 2.2 mg/dL — ABNORMAL HIGH (ref 0.4–1.24)
EGFR: 34 mL/min — ABNORMAL LOW (ref >60)
GLUCOSE,PANEL: 88 mg/dL (ref 70–100)
POTASSIUM: 4.6 MMOL/L (ref 3.5–5.1)
SODIUM: 142 MMOL/L (ref 137–147)
TOTAL BILIRUBIN: 0.6 mg/dL (ref 0.2–1.3)
TOTAL PROTEIN: 6.8 g/dL (ref 6.0–8.0)

## 2023-04-01 LAB — 25-OH VITAMIN D (D2 + D3): VITAMIN D (25-OH) TOTAL: 28 ng/mL — ABNORMAL LOW (ref 30–80)

## 2023-04-01 LAB — PHOSPHORUS: PHOSPHORUS: 2.6 mg/dL (ref 2.0–4.5)

## 2023-04-01 LAB — PARATHYROID HORMONE: PTH HORMONE: 209 pg/mL — ABNORMAL HIGH (ref 10–65)

## 2023-04-07 ENCOUNTER — Encounter: Admit: 2023-04-07 | Discharge: 2023-04-07 | Payer: BC Managed Care – PPO

## 2023-04-07 NOTE — Progress Notes
Submitted prior auth for Jynarque via CoverMyMeds. Awaiting response.

## 2023-04-27 ENCOUNTER — Encounter: Admit: 2023-04-27 | Discharge: 2023-04-27 | Payer: BC Managed Care – PPO

## 2023-04-27 NOTE — Progress Notes
Rec'd approval for pt Jynarque from 04/07/2023-04/06/2024.

## 2023-06-30 ENCOUNTER — Ambulatory Visit: Admit: 2023-06-30 | Discharge: 2023-06-30 | Payer: BC Managed Care – PPO

## 2023-06-30 ENCOUNTER — Encounter: Admit: 2023-06-30 | Discharge: 2023-06-30 | Payer: BC Managed Care – PPO

## 2023-06-30 DIAGNOSIS — I480 Paroxysmal atrial fibrillation: Secondary | ICD-10-CM

## 2023-06-30 DIAGNOSIS — K219 Gastro-esophageal reflux disease without esophagitis: Secondary | ICD-10-CM

## 2023-06-30 DIAGNOSIS — Q612 Polycystic kidney, adult type: Secondary | ICD-10-CM

## 2023-06-30 DIAGNOSIS — E785 Hyperlipidemia, unspecified: Secondary | ICD-10-CM

## 2023-06-30 DIAGNOSIS — N1832 Stage 3b chronic kidney disease (HCC): Secondary | ICD-10-CM

## 2023-06-30 DIAGNOSIS — I151 Hypertension secondary to other renal disorders: Secondary | ICD-10-CM

## 2023-06-30 DIAGNOSIS — R0602 Shortness of breath: Secondary | ICD-10-CM

## 2023-06-30 DIAGNOSIS — M109 Gout, unspecified: Secondary | ICD-10-CM

## 2023-06-30 DIAGNOSIS — R0681 Apnea, not elsewhere classified: Secondary | ICD-10-CM

## 2023-06-30 DIAGNOSIS — I10 Essential (primary) hypertension: Secondary | ICD-10-CM

## 2023-06-30 DIAGNOSIS — Q613 Polycystic kidney, unspecified: Secondary | ICD-10-CM

## 2023-06-30 LAB — COMPREHENSIVE METABOLIC PANEL
ALBUMIN: 4 g/dL (ref 3.5–5.0)
ALK PHOSPHATASE: 77 U/L (ref 25–110)
ALT: 13 U/L (ref 7–56)
ANION GAP: 10 (ref 3–12)
AST: 14 U/L (ref 7–40)
BLD UREA NITROGEN: 27 mg/dL — ABNORMAL HIGH (ref 7–25)
CALCIUM: 9.5 mg/dL (ref 8.5–10.6)
CHLORIDE: 110 MMOL/L (ref 98–110)
CO2: 23 MMOL/L (ref 21–30)
CREATININE: 2.2 mg/dL — ABNORMAL HIGH (ref 0.4–1.24)
EGFR: 33 mL/min — ABNORMAL LOW (ref >60)
GLUCOSE,PANEL: 80 mg/dL (ref 70–100)
POTASSIUM: 4.2 MMOL/L (ref 3.5–5.1)
SODIUM: 143 MMOL/L (ref 137–147)
TOTAL BILIRUBIN: 0.7 mg/dL (ref 0.2–1.3)
TOTAL PROTEIN: 6.6 g/dL (ref 6.0–8.0)

## 2023-06-30 MED ORDER — HYDROCHLOROTHIAZIDE 25 MG PO TAB
25 mg | ORAL_TABLET | Freq: Every morning | ORAL | 11 refills | 28.00000 days | Status: AC
Start: 2023-06-30 — End: ?

## 2023-07-26 ENCOUNTER — Encounter: Admit: 2023-07-26 | Discharge: 2023-07-26 | Payer: BC Managed Care – PPO

## 2023-07-26 MED ORDER — JYNARQUE 60 MG (AM)/ 30 MG (PM) PO TBSQ
ORAL_TABLET | ORAL | 0 refills | Status: AC
Start: 2023-07-26 — End: ?

## 2023-08-03 ENCOUNTER — Encounter: Admit: 2023-08-03 | Discharge: 2023-08-03 | Payer: BC Managed Care – PPO

## 2023-08-03 ENCOUNTER — Ambulatory Visit: Admit: 2023-08-03 | Discharge: 2023-08-03 | Payer: BC Managed Care – PPO

## 2023-08-03 DIAGNOSIS — N1832 Stage 3b chronic kidney disease (HCC): Secondary | ICD-10-CM

## 2023-08-03 LAB — BASIC METABOLIC PANEL
ANION GAP: 9 (ref 3–12)
BLD UREA NITROGEN: 31 mg/dL — ABNORMAL HIGH (ref 7–25)
CHLORIDE: 106 MMOL/L (ref 98–110)
CO2: 26 MMOL/L (ref 21–30)
CREATININE: 2.5 mg/dL — ABNORMAL HIGH (ref 0.4–1.24)
EGFR: 28 mL/min — ABNORMAL LOW (ref >60)
GLUCOSE,PANEL: 96 mg/dL (ref 70–100)
POTASSIUM: 4.3 MMOL/L (ref 3.5–5.1)
SODIUM: 141 MMOL/L (ref 137–147)

## 2023-08-03 NOTE — Telephone Encounter
Pt called, wanted to let me know he had his labs drawn for f/u after taking HCTZ 25mg  since his last OV. Advised to cont med for now and will let him know if any changes needed based on results. Pt verbalized understanding.

## 2023-08-04 ENCOUNTER — Encounter: Admit: 2023-08-04 | Discharge: 2023-08-04 | Payer: BC Managed Care – PPO

## 2023-08-04 NOTE — Telephone Encounter
Rec'd VM from pt, concerned regarding his labs. Per Dr. Doy Hutching, pt to push fluids and recheck labs in 1 month. Attempted to contact pt, LVM.

## 2023-08-11 ENCOUNTER — Encounter: Admit: 2023-08-11 | Discharge: 2023-08-11 | Payer: BC Managed Care – PPO

## 2023-08-22 ENCOUNTER — Encounter: Admit: 2023-08-22 | Discharge: 2023-08-22 | Payer: BC Managed Care – PPO

## 2023-08-22 MED ORDER — JYNARQUE 60 MG (AM)/ 30 MG (PM) PO TBSQ
ORAL_TABLET | ORAL | 11 refills | Status: AC
Start: 2023-08-22 — End: ?

## 2023-08-30 ENCOUNTER — Encounter: Admit: 2023-08-30 | Discharge: 2023-08-30 | Payer: BC Managed Care – PPO

## 2023-08-30 ENCOUNTER — Ambulatory Visit: Admit: 2023-08-30 | Discharge: 2023-08-31 | Payer: BC Managed Care – PPO

## 2023-08-31 ENCOUNTER — Encounter: Admit: 2023-08-31 | Discharge: 2023-08-31 | Payer: BC Managed Care – PPO

## 2023-09-28 ENCOUNTER — Encounter: Admit: 2023-09-28 | Discharge: 2023-09-28 | Payer: BC Managed Care – PPO

## 2023-09-28 MED ORDER — HYDROCHLOROTHIAZIDE 25 MG PO TAB
25 mg | ORAL_TABLET | Freq: Every morning | ORAL | 2 refills | 28.00000 days | Status: AC
Start: 2023-09-28 — End: ?

## 2023-09-28 NOTE — Telephone Encounter
Pharmacy requesting 90 day supply of HCTZ instead of 30 day. 90 day supply sent per protocol.

## 2023-10-24 ENCOUNTER — Ambulatory Visit: Admit: 2023-10-24 | Discharge: 2023-10-25 | Payer: BC Managed Care – PPO

## 2023-10-24 ENCOUNTER — Encounter: Admit: 2023-10-24 | Discharge: 2023-10-24 | Payer: BC Managed Care – PPO

## 2023-12-06 ENCOUNTER — Encounter: Admit: 2023-12-06 | Discharge: 2023-12-06 | Payer: BC Managed Care – PPO

## 2023-12-06 NOTE — Research Notes
 Research Informed Consent Note    NAME: Matthew Paul             MRN: [id number]             DOB:10-23-1965          AGE: 58 y.o.    IRB Number: [id number]  Consent Approval Dates: [id number]    Clinical research participation and research nature of the trial were discussed with subject. The subject was alert and oriented during consent discussion and arrived alone. Subject was informed that study is voluntary and  he may withdraw consent at any time for any reason by notifying study team.  Study purpose, procedures, benefits, risks and duration of the study, confidentiality information, and compensation were discussed.  Alternatives to participation were discussed per consent form.  Subject verbalized understanding.    Subject was provided time to review the consent form.  All questions asked were answered.  Subject voiced desire to participate in the study and signed the informed consent form without coercion and undue influence.  A copy of the signed consent was given to the subject as well as contact information for the study team.  A copy of the consent form was e-mailed to [facility] [employer] ([employer]) for scanning into the subject's medical record.    No research procedures took place prior to consenting.

## 2024-01-25 ENCOUNTER — Encounter: Admit: 2024-01-25 | Discharge: 2024-01-25 | Payer: BLUE CROSS/BLUE SHIELD

## 2024-01-25 ENCOUNTER — Ambulatory Visit: Admit: 2024-01-25 | Discharge: 2024-01-26 | Payer: BLUE CROSS/BLUE SHIELD

## 2024-01-25 DIAGNOSIS — Q613 Polycystic kidney, unspecified: Secondary | ICD-10-CM

## 2024-01-26 ENCOUNTER — Ambulatory Visit: Admit: 2024-01-26 | Discharge: 2024-01-27 | Payer: BLUE CROSS/BLUE SHIELD

## 2024-01-26 ENCOUNTER — Encounter: Admit: 2024-01-26 | Discharge: 2024-01-26 | Payer: BLUE CROSS/BLUE SHIELD

## 2024-01-26 DIAGNOSIS — N1832 Stage 3b chronic kidney disease (CMS-HCC): Secondary | ICD-10-CM

## 2024-01-26 DIAGNOSIS — Q613 Polycystic kidney, unspecified: Secondary | ICD-10-CM

## 2024-01-26 DIAGNOSIS — I151 Hypertension secondary to other renal disorders: Secondary | ICD-10-CM

## 2024-01-26 NOTE — Progress Notes
 History          CC: PKD with CKD     HPI: Matthew Paul is a 58 y.o. male here for follow up on his PKD.  Since starting HCTZ he is sleeping more he is going 2x per night on a good night and 5 on a bad night.  He is starting to feel more bloated/full again since his decortication.       Medication     acetaminophen (TYLENOL) 325 mg tablet Take two tablets by mouth every 4 hours as needed.    allopurinoL (ZYLOPRIM) 100 mg tablet TAKE ONE TABLET BY MOUTH DAILY. TAKE WITH FOOD.    ascorbic acid 1,000 mg tablet Take one tablet by mouth daily.    aspirin 81 mg chewable tablet Chew one tablet by mouth daily. Take with food.    atorvastatin (LIPITOR) 20 mg tablet Take one-half tablet by mouth daily.    calcium carbonate (CALCIUM 600 PO) Take 1 tablet by mouth daily.    cetirizine (ZYRTEC) 10 mg tablet Take one tablet by mouth daily as needed for Allergy symptoms. Takes either cetirizine or fexofenadine.    cholecalciferol (VITAMIN D-3) 400 unit tab tablet Take one tablet by mouth daily.    colchicine 0.6 mg tablet Take one tablet by mouth as Needed (for gout flares).    fexofenadine HCl (ALLEGRA PO) Take 1 tablet by mouth daily as needed. Takes either cetirizine or fexofenadine.    FLAXSEED OIL PO Take 1 tablet by mouth daily.    hydroCHLOROthiazide (HYDRODIURIL) 25 mg tablet Take one tablet by mouth every morning.    lisinopril (PRINIVIL; ZESTRIL) 20 mg tablet Take one tablet by mouth daily.    omeprazole DR (PRILOSEC) 40 mg capsule Take one capsule by mouth daily.    oxyCODONE (ROXICODONE) 5 mg tablet Take one tablet to two tablets by mouth every 4 hours as needed    polyethylene glycol 3350 (MIRALAX) 17 g packet Take one packet by mouth twice daily.    predniSONE (DELTASONE) 20 mg tablet Take one tablet by mouth as Needed.    senna/docusate (SENOKOT-S) 8.6/50 mg tablet Take one tablet by mouth twice daily.    tolvaptan (polycys kidney dis) (JYNARQUE) 60 mg (AM)/ 30 mg (PM) TbSQ Take sixty mg by mouth daily AND thirty mg daily after lunch.    vitamin E 400 unit capsule Take one capsule by mouth daily.    vitamins, B complex tab Take two tablets by mouth daily.              Physical        Vitals:    01/26/24 1246   BP: (P) 121/83   BP Source: Arm, Right Upper   PainSc: Zero   Weight: 135.4 kg (298 lb 8 oz)   Height: 200.7 cm (6' 7)     Body mass index is 33.63 kg/m?. 135/90 on recheck sitting.     Gen: Alert and Oriented, No Acute Distress   HEENT: Sclera normal; MMM    CV: no JVD, S1 and S2 normal, no rubs, murmurs or gallops   Pulm: Clear to Auscultation bilateral   Neuro: Grossly normal, moving all extremities, speech intact  Ext: no edema, or cyanosis   Abd: kidneys palpable bilaterally   Skin: no rash      Assessment and Plan         Matthew Paul is a 58 y.o. 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   Confirmed by imaging: MRI 2014  - continue tolvaptan  at 60mg /30mg   - baseline Cr 1.75-2   - doing well on tolvaptan  dose, will continue to monitor LFTs and for side effects   - right kidney cysts decortication 05/2019 left kidney cysts decortication 09/2019   - last LFTs done 09/2022 normal   - Cr at a baseline on labs done today   - continue CMP every 3 months   - restarted tovlaptan in June 2023 after regulus study   - continue tolvaptan  60/30   - will get MRI to see kidney size and location for more cyst decortication     HTN  - controlled continue current regimen      Anemia   - hb at goal 04/2022    Mineral bone disease   - PTH, phos and vitamin D at goal 03/2023             Douglass Gelineau, MD   Pager [phone]       Patient Instructions   Please do not hesitate to call with any questions.  My nurse Alisa App can be reached [phone].     Please go to the lab every 3 month     Return to clinic in 6 months     Tolvaptan  Fluid Intake Instructions   Please drink enough fluid to prevent excessive thirst throughout the daytime period and an additional 1-2 cups of water before bedtime with additional replenishment with each episode of nighttime urination to prevent dehydration  If you are sick and not able to keep fluids down please do not take tolvaptan  until able to eat and drink again   If you have are having a procedure that they ask you not to eat or drink please hold the evening dose of tolvaptan  prior to the procedure and not take again until able to drink again

## 2024-02-11 ENCOUNTER — Encounter: Admit: 2024-02-11 | Discharge: 2024-02-11 | Payer: BLUE CROSS/BLUE SHIELD

## 2024-03-15 ENCOUNTER — Encounter: Admit: 2024-03-15 | Discharge: 2024-03-15 | Payer: BLUE CROSS/BLUE SHIELD

## 2024-03-15 NOTE — Telephone Encounter
 Received VM message from pt stating he had not heard from urology since being told a referral to them would be placed. Asking that we let him know the status of the referral. No mention of referral noted in Dr Jarold Merlin notes from [facility]. Send a message to Dr Yvone Herd, pt was to have an MRI. Those results would determine the need for the referral. Attempted to call pt back, left VM message with call back #.

## 2024-03-16 ENCOUNTER — Encounter: Admit: 2024-03-16 | Discharge: 2024-03-16 | Payer: BLUE CROSS/BLUE SHIELD

## 2024-04-30 ENCOUNTER — Encounter: Admit: 2024-04-30 | Discharge: 2024-04-30 | Payer: BLUE CROSS/BLUE SHIELD

## 2024-05-01 ENCOUNTER — Ambulatory Visit: Admit: 2024-05-01 | Discharge: 2024-05-02 | Payer: BLUE CROSS/BLUE SHIELD

## 2024-05-29 ENCOUNTER — Encounter: Admit: 2024-05-29 | Discharge: 2024-05-29 | Payer: BLUE CROSS/BLUE SHIELD

## 2024-06-28 ENCOUNTER — Encounter: Admit: 2024-06-28 | Discharge: 2024-06-28 | Payer: BLUE CROSS/BLUE SHIELD

## 2024-06-28 ENCOUNTER — Ambulatory Visit: Admit: 2024-06-28 | Discharge: 2024-06-28 | Payer: BLUE CROSS/BLUE SHIELD

## 2024-06-28 DIAGNOSIS — Q612 Polycystic kidney, adult type: Principal | ICD-10-CM

## 2024-06-28 NOTE — Telephone Encounter
 Rec'd call from pt. He was able to get his MRI done sooner (completed today). Wondering about referral to Urology?  Advised will place referral and hopefully he should hear something from Urology soon. Pt verbalized understanding.

## 2024-07-02 NOTE — Progress Notes
 Date of Service: 07/03/2024     History of Present Illness  Very pleasant year old male with polycystic kidney disease who presents for further evaluation of his condition. He was referred by Dr. Suzann for further evaluation of his polycystic kidney disease.    He has a history of polycystic kidney disease with markedly enlarged kidneys, with an increase in kidney volume confirmed by MRI over the years. He experiences symptoms similar to those before his previous surgeries, including a sensation of 'getting big, run out of air' and occasionally catching his breath.    Approximately five years ago, he underwent surgeries with Dr. Diamond, which provided relief for that duration. Previous treatments included aspiration of cysts at [facility], where larger cysts were initially drained. Subsequent aspirations revealed many small cysts described as 'grapes' on sonogram, providing less relief.    His current estimated glomerular filtration rate (eGFR) is in the high twenties, with a slight increase in creatinine levels over the years.    He denies any blood in his urine or difficulties with urination. However, he describes a sensation of congestion affecting his diaphragm, impacting his breathing.    Past Medical History:    Acid reflux    ADPKD (autosomal dominant polycystic kidney)    Apnea    Gout    Hyperlipidemia    Hypertension    Mild shortness of breath    Paroxysmal A-fib (CMS-HCC)       Surgical History:   Procedure Laterality Date    HX HERNIA REPAIR Right 1983    ROBOTIC LAPAROSCOPIC ABLATION RENAL CYSTS Right 06/08/2019    Performed by Erskin Lenis, MD at [facility] OR    ROBOT ASSISTED SURGERY N/A 06/08/2019    Performed by Erskin Lenis, MD at [facility] OR    CYSTOURETHROSCOPY WITH INDWELLING URETERAL STENT INSERTION Right 06/08/2019    Performed by Erskin Lenis, MD at [facility] OR    ROBOTIC ASSISTED LAPAROSCOPIC RENAL CYST DECORTICATION Left 10/02/2019    Performed by Erskin Lenis, MD at [facility] OR CYSTOURETHROSCOPY WITH INDWELLING URETERAL STENT INSERTION Left 10/02/2019    Performed by Erskin Lenis, MD at [facility] OR    HX TONSILLECTOMY      KIDNEY CYST REMOVAL      Multiple    KNEE CARTILAGE SURGERY Bilateral        No family history on file.    Current Medications[1]    Allergies[2]    Social History     Socioeconomic History    Marital status: Divorced   Tobacco Use    Smoking status: Never    Smokeless tobacco: Former     Types: Chew     Quit date: 1990    Tobacco comments:     Chewed x10 years   Vaping Use    Vaping status: Never Used   Substance and Sexual Activity    Alcohol use: Yes     Comment: Occasional    Drug use: Never       Objective:          acetaminophen  (TYLENOL ) 325 mg tablet Take two tablets by mouth every 4 hours as needed.    allopurinoL  (ZYLOPRIM ) 100 mg tablet TAKE ONE TABLET BY MOUTH DAILY. TAKE WITH FOOD.    ascorbic acid 1,000 mg tablet Take one tablet by mouth daily.    aspirin  81 mg chewable tablet Chew one tablet by mouth daily. Take with food.    atorvastatin  (LIPITOR) 20 mg tablet Take one-half tablet by  mouth daily.    calcium carbonate (CALCIUM 600 PO) Take 1 tablet by mouth daily.    cetirizine  (ZYRTEC ) 10 mg tablet Take one tablet by mouth daily as needed for Allergy symptoms. Takes either cetirizine  or fexofenadine.    cholecalciferol  (VITAMIN D-3) 400 unit tab tablet Take one tablet by mouth daily.    colchicine 0.6 mg tablet Take one tablet by mouth as Needed (for gout flares).    fexofenadine HCl (ALLEGRA PO) Take 1 tablet by mouth daily as needed. Takes either cetirizine  or fexofenadine.    FLAXSEED OIL PO Take 1 tablet by mouth daily.    hydroCHLOROthiazide  (HYDRODIURIL ) 25 mg tablet Take one tablet by mouth every morning.    lisinopril  (PRINIVIL ; ZESTRIL ) 20 mg tablet Take one tablet by mouth daily.    omeprazole DR (PRILOSEC) 40 mg capsule Take one capsule by mouth daily.    oxyCODONE  (ROXICODONE ) 5 mg tablet Take one tablet to two tablets by mouth every 4 hours as needed (Patient not taking: Reported on 07/03/2024)    polyethylene glycol 3350  (MIRALAX ) 17 g packet Take one packet by mouth twice daily.    predniSONE (DELTASONE) 20 mg tablet Take one tablet by mouth as Needed.    senna/docusate (SENOKOT-S) 8.6/50 mg tablet Take one tablet by mouth twice daily.    tolvaptan  (polycys kidney dis) (JYNARQUE ) 60 mg (AM)/ 30 mg (PM) TbSQ Take sixty mg by mouth daily AND thirty mg daily after lunch.    vitamin E 400 unit capsule Take one capsule by mouth daily.    vitamins, B complex tab Take two tablets by mouth daily.     Vitals:    07/03/24 0913   BP: 124/82   BP Source: Arm, Right Upper   Pulse: 92   Temp: 36.7 ?C (98 ?F)   SpO2: 99%   TempSrc: Temporal   PainSc: Four   Weight: (!) 139.1 kg (306 lb 9.6 oz)   Height: 200.7 cm (6' 7)     Body mass index is 34.54 kg/m?SABRA       Physical Exam  Constitutional:       General: He is not in acute distress.     Appearance: He is well-developed. He is not diaphoretic.   HENT:      Head: Normocephalic and atraumatic.   Eyes:      General:         Right eye: No discharge.         Left eye: No discharge.      Conjunctiva/sclera: Conjunctivae normal.   Cardiovascular:      Rate and Rhythm: Normal rate.   Pulmonary:      Effort: Pulmonary effort is normal. No respiratory distress.   Musculoskeletal:         General: No deformity.      Cervical back: Normal range of motion.   Skin:     General: Skin is warm and dry.   Neurological:      Mental Status: He is alert and oriented to person, place, and time.   Psychiatric:         Behavior: Behavior normal.         Thought Content: Thought content normal.         Judgment: Judgment normal.       MRI ABD W (06/28/24)    FINDINGS:     Limited evaluation without the use of IV contrast which includes the   viscera and vasculature.  Adrenal Glands and Kidneys: Unremarkable adrenal glands. There is   polycystic kidney disease with both kidneys being completely replaced by   cysts, many of which are hemorrhagic. No definite hydronephrosis.     The left kidney measures 26.6 x 23.4 x 13.4 cm. Left renal volume is 4362   mL. The right kidney measures 20.8 x 18.7 x 14.4 cm. Right renal volume is   2929 mL.     IMPRESSION     1. Markedly enlarged polycystic kidneys, volumes as above.     Assessment & Plan  Polycystic kidney disease with progressive renal enlargement and symptoms  Polycystic kidney disease with markedly enlarged kidneys, progressive renal enlargement, increased pressure, discomfort, and dyspnea. Current eGFR in high twenties. MRI shows increased kidney volume with multiple small cysts. No hematuria or urinary difficulties. Previous surgical intervention provided relief. Aspiration of cysts provided limited relief due to small cyst size.  - Discuss case with Dr. Erskin on Thursday to review MRI and consider surgical options.  - Refer to Dr. Erskin for potential repeat surgical intervention if appropriate.  - Perform labs today for nephrology appointment with Dr. Suzann.  - Coordinate with Dr. Erskin for potential surgery scheduling if repeat procedure agreed upon.       Return for Dr. Erskin, renal cyst decortication consult.    Matthew ONEIDA Gaines, APRN-NP  Department of Urology              [1]   Current Outpatient Medications   Medication Sig Dispense Refill    acetaminophen  (TYLENOL ) 325 mg tablet Take two tablets by mouth every 4 hours as needed.      allopurinoL  (ZYLOPRIM ) 100 mg tablet TAKE ONE TABLET BY MOUTH DAILY. TAKE WITH FOOD. 90 tablet 1    ascorbic acid 1,000 mg tablet Take one tablet by mouth daily.      aspirin  81 mg chewable tablet Chew one tablet by mouth daily. Take with food.      atorvastatin  (LIPITOR) 20 mg tablet Take one-half tablet by mouth daily.      calcium carbonate (CALCIUM 600 PO) Take 1 tablet by mouth daily.      cetirizine  (ZYRTEC ) 10 mg tablet Take one tablet by mouth daily as needed for Allergy symptoms. Takes either cetirizine  or fexofenadine.      cholecalciferol  (VITAMIN D-3) 400 unit tab tablet Take one tablet by mouth daily.      colchicine 0.6 mg tablet Take one tablet by mouth as Needed (for gout flares).      fexofenadine HCl (ALLEGRA PO) Take 1 tablet by mouth daily as needed. Takes either cetirizine  or fexofenadine.      FLAXSEED OIL PO Take 1 tablet by mouth daily.      hydroCHLOROthiazide  (HYDRODIURIL ) 25 mg tablet Take one tablet by mouth every morning. 90 tablet 2    lisinopril  (PRINIVIL ; ZESTRIL ) 20 mg tablet Take one tablet by mouth daily.      omeprazole DR (PRILOSEC) 40 mg capsule Take one capsule by mouth daily.      oxyCODONE  (ROXICODONE ) 5 mg tablet Take one tablet to two tablets by mouth every 4 hours as needed (Patient not taking: Reported on 07/03/2024) 20 tablet 0    polyethylene glycol 3350  (MIRALAX ) 17 g packet Take one packet by mouth twice daily. 30 each 1    predniSONE (DELTASONE) 20 mg tablet Take one tablet by mouth as Needed.      senna/docusate (SENOKOT-S) 8.6/50 mg tablet Take one tablet by mouth  twice daily. 28 tablet 1    tolvaptan  (polycys kidney dis) (JYNARQUE ) 60 mg (AM)/ 30 mg (PM) TbSQ Take sixty mg by mouth daily AND thirty mg daily after lunch. 56 tablet 11    vitamin E 400 unit capsule Take one capsule by mouth daily.      vitamins, B complex tab Take two tablets by mouth daily.       No current facility-administered medications for this visit.   [2]   Allergies  Allergen Reactions    Seasonal Allergies SNEEZING

## 2024-07-03 ENCOUNTER — Ambulatory Visit: Admit: 2024-07-03 | Discharge: 2024-07-03 | Payer: BLUE CROSS/BLUE SHIELD

## 2024-07-03 ENCOUNTER — Encounter: Admit: 2024-07-03 | Discharge: 2024-07-03 | Payer: BLUE CROSS/BLUE SHIELD

## 2024-07-03 VITALS — BP 124/82 | HR 92 | Temp 98.00000°F | Ht 79.0 in | Wt 306.6 lb

## 2024-07-03 DIAGNOSIS — Q613 Polycystic kidney, unspecified: Principal | ICD-10-CM

## 2024-07-04 DIAGNOSIS — Q612 Polycystic kidney, adult type: Secondary | ICD-10-CM

## 2024-07-05 ENCOUNTER — Encounter: Admit: 2024-07-05 | Discharge: 2024-07-05 | Payer: BLUE CROSS/BLUE SHIELD

## 2024-07-12 ENCOUNTER — Encounter: Admit: 2024-07-12 | Discharge: 2024-07-12 | Payer: BLUE CROSS/BLUE SHIELD

## 2024-07-12 ENCOUNTER — Ambulatory Visit: Admit: 2024-07-12 | Discharge: 2024-07-12 | Payer: BLUE CROSS/BLUE SHIELD

## 2024-07-12 VITALS — BP 115/77 | HR 92 | Temp 97.50000°F | Ht 79.0 in | Wt 303.2 lb

## 2024-07-12 DIAGNOSIS — N281 Cyst of kidney, acquired: Secondary | ICD-10-CM

## 2024-07-12 DIAGNOSIS — J9601 Acute respiratory failure with hypoxia: Secondary | ICD-10-CM

## 2024-07-12 DIAGNOSIS — I469 Cardiac arrest, cause unspecified: Secondary | ICD-10-CM

## 2024-07-12 DIAGNOSIS — N1832 Stage 3b chronic kidney disease (CMS-HCC): Secondary | ICD-10-CM

## 2024-07-12 DIAGNOSIS — E8729 High anion gap metabolic acidosis: Secondary | ICD-10-CM

## 2024-07-12 DIAGNOSIS — Q613 Polycystic kidney, unspecified: Principal | ICD-10-CM

## 2024-07-12 DIAGNOSIS — R578 Other shock: Secondary | ICD-10-CM

## 2024-07-12 DIAGNOSIS — N179 Acute kidney failure, unspecified: Secondary | ICD-10-CM

## 2024-07-12 NOTE — Progress Notes
 Date of Service: 07/12/2024     Subjective:             Matthew Paul is a 58 y.o. male who presents for evaluation of a repeat cyst decortication for polycystic kidney disease.    History of Present Illness  Matthew Paul is a 58 y.o. male with  polycystic kidney disease who presents for further evaluation of his condition. He was referred by Dr. Suzann for further evaluation of his polycystic kidney disease.     He has a history of polycystic kidney disease with markedly enlarged kidneys, with an increase in kidney volume confirmed by MRI over the years. He experiences symptoms similar to those before his previous surgeries, including a sensation of 'getting big, run out of air' and occasionally catching his breath.     Approximately five years ago, he underwent laparoscopic renal cyst decortication with Dr. Erskin, which provided relief for that duration. Previous treatments included aspiration of cysts at [facility], where larger cysts were initially drained. Subsequent aspirations revealed many small cysts described as 'grapes' on sonogram, providing less relief.     His current estimated glomerular filtration rate (eGFR) is in the high twenties, with a slight increase in creatinine levels over the years.     He denies any blood in his urine or difficulties with urination. However, he describes a sensation of congestion affecting his diaphragm, impacting his breathing.    Cr 05/01/24: 2.74    MRI 06/28/24: The left kidney measures 26.6 x 23.4 x 13.4 cm. Left renal volume is 4362 mL. The right kidney measures 20.8 x 18.7 x 14.4 cm. Right renal volume is 2929 mL.       Past Medical History:    Acid reflux    ADPKD (autosomal dominant polycystic kidney)    Apnea    Gout    Hyperlipidemia    Hypertension    Mild shortness of breath    Paroxysmal A-fib (CMS-HCC)       Surgical History:   Procedure Laterality Date    HX HERNIA REPAIR Right 1983    ROBOTIC LAPAROSCOPIC ABLATION RENAL CYSTS Right 06/08/2019    Performed by Erskin Lenis, MD at [facility] OR    ROBOT ASSISTED SURGERY N/A 06/08/2019    Performed by Erskin Lenis, MD at [facility] OR    CYSTOURETHROSCOPY WITH INDWELLING URETERAL STENT INSERTION Right 06/08/2019    Performed by Erskin Lenis, MD at [facility] OR    ROBOTIC ASSISTED LAPAROSCOPIC RENAL CYST DECORTICATION Left 10/02/2019    Performed by Erskin Lenis, MD at [facility] OR    CYSTOURETHROSCOPY WITH INDWELLING URETERAL STENT INSERTION Left 10/02/2019    Performed by Erskin Lenis, MD at [facility] OR    HX TONSILLECTOMY      KIDNEY CYST REMOVAL      Multiple    KNEE CARTILAGE SURGERY Bilateral        No family history on file.    Current Medications[1]    Allergies[2]    Social History     Socioeconomic History    Marital status: Divorced   Tobacco Use    Smoking status: Never    Smokeless tobacco: Former     Types: Chew     Quit date: 1990    Tobacco comments:     Chewed x10 years   Vaping Use    Vaping status: Never Used   Substance and Sexual Activity    Alcohol use: Yes     Comment: Occasional  Drug use: Never       Review of Systems  Negative except by what is stated in HPI    Objective:          acetaminophen  (TYLENOL ) 325 mg tablet Take two tablets by mouth every 4 hours as needed.    allopurinoL  (ZYLOPRIM ) 100 mg tablet TAKE ONE TABLET BY MOUTH DAILY. TAKE WITH FOOD.    ascorbic acid 1,000 mg tablet Take one tablet by mouth daily.    aspirin  81 mg chewable tablet Chew one tablet by mouth daily. Take with food.    atorvastatin  (LIPITOR) 20 mg tablet Take one-half tablet by mouth daily.    calcium carbonate (CALCIUM 600 PO) Take 1 tablet by mouth daily.    cetirizine  (ZYRTEC ) 10 mg tablet Take one tablet by mouth daily as needed for Allergy symptoms. Takes either cetirizine  or fexofenadine.    cholecalciferol  (VITAMIN D-3) 400 unit tab tablet Take one tablet by mouth daily.    colchicine 0.6 mg tablet Take one tablet by mouth as Needed (for gout flares).    fexofenadine HCl (ALLEGRA PO) Take 1 tablet by mouth daily as needed. Takes either cetirizine  or fexofenadine.    FLAXSEED OIL PO Take 1 tablet by mouth daily.    hydroCHLOROthiazide  (HYDRODIURIL ) 25 mg tablet Take one tablet by mouth every morning.    lisinopril  (PRINIVIL ; ZESTRIL ) 20 mg tablet Take one tablet by mouth daily.    omeprazole DR (PRILOSEC) 40 mg capsule Take one capsule by mouth daily.    oxyCODONE  (ROXICODONE ) 5 mg tablet Take one tablet to two tablets by mouth every 4 hours as needed (Patient not taking: Reported on 07/12/2024)    polyethylene glycol 3350  (MIRALAX ) 17 g packet Take one packet by mouth twice daily.    predniSONE (DELTASONE) 20 mg tablet Take one tablet by mouth as Needed.    senna/docusate (SENOKOT-S) 8.6/50 mg tablet Take one tablet by mouth twice daily.    tolvaptan  (polycys kidney dis) (JYNARQUE ) 60 mg (AM)/ 30 mg (PM) TbSQ Take sixty mg by mouth daily AND thirty mg daily after lunch.    vitamin E 400 unit capsule Take one capsule by mouth daily.    vitamins, B complex tab Take two tablets by mouth daily.     Vitals:    07/12/24 1438   BP: (P) 115/77   BP Source: Arm, Right Upper   Pulse: 92   Temp: 36.4 ?C (97.5 ?F)   SpO2: 95%   TempSrc: Temporal   PainSc: Zero   Weight: (!) 137.5 kg (303 lb 3.2 oz)   Height: 200.7 cm (6' 7)     Body mass index is 34.16 kg/m?SABRA     Physical Exam  Constitutional:       Appearance: Normal appearance.   HENT:      Head: Normocephalic.   Cardiovascular:      Rate and Rhythm: Normal rate and regular rhythm.      Pulses: Normal pulses.      Heart sounds: Normal heart sounds.   Pulmonary:      Effort: Pulmonary effort is normal.      Breath sounds: Normal breath sounds.   Abdominal:      General: Bowel sounds are normal. There is distension.      Palpations: Abdomen is soft.      Tenderness: There is no abdominal tenderness.      Hernia: No hernia is present.   Musculoskeletal:  General: Normal range of motion.   Skin:     General: Skin is warm.      Capillary Refill: Capillary refill takes less than 2 seconds.   Neurological:      Mental Status: He is alert and oriented to person, place, and time.   Psychiatric:         Behavior: Behavior normal.       Comprehensive Metabolic Profile    Lab Results   Component Value Date/Time    NA 140 07/03/2024 10:31 AM    K 4.5 07/03/2024 10:31 AM    CL 107 07/03/2024 10:31 AM    GLU 103 (H) 07/03/2024 10:31 AM    BUN 40 (H) 07/03/2024 10:31 AM    CR 2.85 (H) 07/03/2024 10:31 AM    CA 10.4 07/03/2024 10:31 AM    TOTPROT 6.9 07/03/2024 10:31 AM    Lab Results   Component Value Date/Time    TOTBILI 0.4 07/03/2024 10:31 AM    ALBUMIN 4.2 07/03/2024 10:31 AM    ALKPHOS 77 07/03/2024 10:31 AM    AST 13 07/03/2024 10:31 AM    CO2 26 07/03/2024 10:31 AM    ALT 12 07/03/2024 10:31 AM    GAP 7 07/03/2024 10:31 AM    EGFR1 28 (L) 08/03/2023 12:46 PM         MRI ABDOMEN WO CONTRAST  Result Date: 06/28/2024  MRI ABDOMEN Clinical Indication:  Polycystic kidney disease. Assess kidney volume. Technique: Multisequence and multiplanar MR imaging was obtained through the abdomen without IV contrast material. IV Contrast: None. Bowel contrast: None Comparison: Most recently CT dated 04/18/2022 performed at an outside institution. FINDINGS: Limited evaluation without the use of IV contrast which includes the viscera and vasculature. Lower Thorax: Unremarkable. Liver and Biliary system: Redemonstrated are innumerable cysts scattered throughout the liver. There is no definite intrahepatic biliary ductal dilatation. Unremarkable gallbladder. Spleen: Unremarkable. Adrenal Glands and Kidneys: Unremarkable adrenal glands. There is polycystic kidney disease with both kidneys being completely replaced by cysts, many of which are hemorrhagic. No definite hydronephrosis. The left kidney measures 26.6 x 23.4 x 13.4 cm. Left renal volume is 4362 mL. The right kidney measures 20.8 x 18.7 x 14.4 cm. Right renal volume is 2929 mL. Pancreas and Retroperitoneum: Unremarkable. Aorta and Major Vessels: Unremarkable. Bowel, Mesentery, and Peritoneal space: There is colonic diverticulosis. There is no ascites. Abdominal Wall and Osseous Structures: Thoracolumbar spondylosis.     1. Markedly enlarged polycystic kidneys, volumes as above.   Finalized by Tresea Kitty, MD on 06/28/2024 12:38 PM. Dictated by Tresea Kitty, MD on 06/28/2024 12:10 PM.       Polycystic kidney disease with markedly enlarged kidneys, progressive renal enlargement, increased pressure, discomfort, and dyspnea.   Current eGFR in high twenties. MRI shows increased kidney volume with multiple small cysts. No hematuria or urinary difficulties. Previous surgical intervention provided relief. Aspiration of cysts provided limited relief due to small cyst size.     Reviewed risk and benefits of left robotic assisted cyst decortication including bleeding, infection, damage to nearby structures, need for additional surgeries, failure to resolve symptoms, persistent question of abdominal fluid, and residual pain.  Patient wishes to proceed.  Will plan to start on the LEFT side initially to see if it provides relief.  Once he has recovered from the LEFT side (~ 4-6 weeks), we will plan for surgery on the RIGHT side.    Plan:  -To OR for left robotic cyst decortication and possible ureteral  stent placement (will place temporary stent at beginning to inject methylene blue  and ensure no collecting system entry)  -Collect urine culture today        Patient seen and discussed with Dr. Erskin, who directed plan of care.    Duwaine Kennyth Mallory, MD  Urology Resident, PGY-1       ATTESTATION    I personally performed the key portions of the E/M visit, discussed case with resident and concur with resident documentation of history, physical exam, assessment, and treatment plan unless otherwise noted.    Staff name:  Alm DELENA Erskin, MD Date:  07/12/2024                 [1]   Current Outpatient Medications   Medication Sig Dispense Refill    acetaminophen  (TYLENOL ) 325 mg tablet Take two tablets by mouth every 4 hours as needed.      allopurinoL  (ZYLOPRIM ) 100 mg tablet TAKE ONE TABLET BY MOUTH DAILY. TAKE WITH FOOD. 90 tablet 1    ascorbic acid 1,000 mg tablet Take one tablet by mouth daily.      aspirin  81 mg chewable tablet Chew one tablet by mouth daily. Take with food.      atorvastatin  (LIPITOR) 20 mg tablet Take one-half tablet by mouth daily.      calcium carbonate (CALCIUM 600 PO) Take 1 tablet by mouth daily.      cetirizine  (ZYRTEC ) 10 mg tablet Take one tablet by mouth daily as needed for Allergy symptoms. Takes either cetirizine  or fexofenadine.      cholecalciferol  (VITAMIN D-3) 400 unit tab tablet Take one tablet by mouth daily.      colchicine 0.6 mg tablet Take one tablet by mouth as Needed (for gout flares).      fexofenadine HCl (ALLEGRA PO) Take 1 tablet by mouth daily as needed. Takes either cetirizine  or fexofenadine.      FLAXSEED OIL PO Take 1 tablet by mouth daily.      hydroCHLOROthiazide  (HYDRODIURIL ) 25 mg tablet Take one tablet by mouth every morning. 90 tablet 2    lisinopril  (PRINIVIL ; ZESTRIL ) 20 mg tablet Take one tablet by mouth daily.      omeprazole DR (PRILOSEC) 40 mg capsule Take one capsule by mouth daily.      oxyCODONE  (ROXICODONE ) 5 mg tablet Take one tablet to two tablets by mouth every 4 hours as needed (Patient not taking: Reported on 07/12/2024) 20 tablet 0    polyethylene glycol 3350  (MIRALAX ) 17 g packet Take one packet by mouth twice daily. 30 each 1    predniSONE (DELTASONE) 20 mg tablet Take one tablet by mouth as Needed.      senna/docusate (SENOKOT-S) 8.6/50 mg tablet Take one tablet by mouth twice daily. 28 tablet 1    tolvaptan  (polycys kidney dis) (JYNARQUE ) 60 mg (AM)/ 30 mg (PM) TbSQ Take sixty mg by mouth daily AND thirty mg daily after lunch. 56 tablet 11    vitamin E 400 unit capsule Take one capsule by mouth daily.      vitamins, B complex tab Take two tablets by mouth daily.       No current facility-administered medications for this visit.   [2]   Allergies  Allergen Reactions    Seasonal Allergies SNEEZING

## 2024-07-12 NOTE — Patient Instructions
 Urology: [facility]   Pre-Operative Instructions    Surgical Procedure:  LEFT Renal Cyst Decortication  Date of Surgery:  08/24/2024   Arrival Time at the Admission Office (Main Lobby):  TBD     To ensure that your surgery can proceed without delay, you will be contacted by a phone triage nurse from the Preoperative Assessment Clinic [facility]) to complete this process.  Please review the information given to you by your surgeon.    You will be called by the surgery staff with your day of surgery arrival time between 2:30 - 4:30 PM the business day prior to surgery. If you have not heard from them after 4:30 PM, please call [phone] to confirm your arrival time.    Pre-Operative Assessment and Medications:  For your safety, some medications might need to be stopped before your upcoming procedure.  The Preoperative Assessment Clinic [facility]) will give you medication instructions.  A PAC nurse will call you to start this process.  You will be asked to complete a phone call with the [facility] team or attend a [facility] appointment.  If you do not complete this process this may result in cancellation or delay of your procedure.     If surgery is within 2 weeks, please read and follow the medication instructions below to prepare for surgery before you speak with the phone triage nurse:    DO NOT STOP your blood thinner until you have contacted your prescribing provider or have been specifically instructed to do so.    Specific Medications    The PAC team will communicate a detailed plan for what to do before your procedure for the following:  Medications that thin your blood  Medications that suppress your immune system  Medications used to treat cancer  Medications prescribed after a heart catheterization and/or stent placement  Medications prescribed to treat or prevent stroke    Heart Medications  Continue all heart medications unless you are told to stop by the [facility] team or your cardiologist.    Aspirin  and Acetaminophen  (Tylenol )  Do NOT take aspirin  for pain.  If you have been told to take aspirin  for a heart condition or to prevent/treat stroke, continue taking Aspirin  and the [facility] team will give you detailed instructions.  It is OKAY to take acetaminophen  (Tylenol ) prior to your procedure.    Specific medication instructions for 14 days prior to your procedure    14 days prior to your procedure, STOP taking most vitamins, herbals, and supplements.      The following medications are OKAY TO CONTINUE:     Calcium  Folic Acid  Iron  Magnesium  Potassium  Probiotic  Vitamin B  Vitamin D  Zinc     7 days prior to your procedure, STOP taking anti-inflammatory medications, as listed below, unless instructed   otherwise.    (Advil, Motrin) Ibuprofen  (Aleve) Naproxen  (Alka-Seltzer) Aspirin / citric acid/sodium bicarbonate   (Celebrex) Celecoxib   (Excedrin)   Acetaminophen / Aspirin / Caffeine  (Indocin) Indomethacin  (Mobic) Meloxicam  (Voltaren) Diclofenac    If you are taking a GLP medication such as:  Dulaglutide (Trulicity)  Exenatide (Bydureon)   Liraglutide (Victoza, Saxenda)  Lixisenatide (Adlyxine)  Tirzepatide (Mounjaro, Zepbound)  Semaglutide (Rybelsus, Portage, Ozempic)     Continue these medications as usual but do not take the morning of procedure. Since you are on one of these medications, you are instructed to have a clear liquid diet the day before surgery and nothing to  eat or drink after 11:00pm the night before surgery. If you received a pre-surgery kit, do not drink any of the provided drinks on the day of surgery. Further detailed instructions will be provided by the Preoperative Assessment Clinic.    Eating or drinking before surgery    Nothing to eat after 11:00pm the night before your surgery including gum, mint, & candy. You may have CLEAR LIQUIDS up to 2 hours before your arrival time. See above if you are on a GLP medication.    Clear Liquid Examples:   Water  Clear juice-Apple or Cranberry (NO PULP or Orange Juice)   Coffee and tea with or without sugar (NO CREAM, Milk, or milk type products)   Sports drinks - Powerade/Gatorade   Soda     Do not drink alcohol within 24 hours of surgery.    Please shower with an over the counter antibacterial soap the evening before or the morning of surgery.    If your surgery is scheduled as an outpatient, you must arrange to have someone drive you home and have someone with you 24 hours after anesthesia.    If you stay in the hospital after surgery, you will be evaluated daily by your care team for appropriateness for discharge. You will be responsible for coordinating your transportation and home support for your recovery. If you do not know the needs for post-surgical support, please address this prior to surgery with your surgeon or your surgeon's nurse so you will have a safe transition home.     ========================================================================    If you have any questions, please contact your provider's office at [phone].    For emergencies during evenings, nights, weekends, and holidays, contact [facility] operator and request they contact the on-call Urology Resident at [phone].    =========================================================================

## 2024-07-30 ENCOUNTER — Encounter: Admit: 2024-07-30 | Discharge: 2024-07-30 | Payer: BLUE CROSS/BLUE SHIELD

## 2024-07-30 MED ORDER — JYNARQUE 60 MG (AM)/ 30 MG (PM) PO TBSQ
ORAL_TABLET | ORAL | 11 refills | Status: AC
Start: 2024-07-30 — End: ?

## 2024-08-06 ENCOUNTER — Encounter: Admit: 2024-08-06 | Discharge: 2024-08-06 | Payer: BLUE CROSS/BLUE SHIELD

## 2024-08-06 NOTE — Pre-Anesthesia Patient Instructions [112001]
 PREPROCEDURE INFORMATION    Arrival at the hospital  [facility]  [address]  [place], [place] [address]    Park in the [employer], located directly across from the main entrance to the hospital.  Enter through the ground floor main hospital entrance and check in at the Information Desk in the lobby.  They will validate your parking ticket and direct you to the next location.  If you are a woman between the ages of 60 and 45, and have not had a hysterectomy, you will be asked for a urine sample prior to surgery.  Please do not urinate before arriving in the Surgery Waiting Room.  Once there, check in and let the attendant know if you need to provide a sample.  You will receive a call with your surgery arrival time between 2:30pm and 4:30pm the last business day before your procedure.     *IMPORTANT* Eating and drinking instructions before surgery  Nothing to eat after 11:00pm the night before your surgery - including gum, mints, and hard candy. You may have clear liquids up to 2 hours before your arrival time.     Clear liquid examples include: (If diabetic, blood sugar must be less than 200)  Water  Clear juice (apple or cranberry - no pulp or orange juice)  Black coffee and plain tea (sugar/artificial sweetener only - add nothing else)  Sports drink (Powerade/Gatorade)  Carbonated Beverages (Sprite, Cola)  Pedialyte  Bowel Prep (if ordered by physician)    Planning transportation for outpatient procedure  For your safety, you will need to arrange for a responsible ride/person to accompany you home due to sedation or anesthesia with your procedure.  An [place], taxi or other public transportation driver is not considered a responsible person to accompany you home.    Bath/Shower Instructions  Take a bath or shower with antibacterial soap the night before and the morning of your procedure. Use clean towels.  Put on clean clothes after bath or shower.  Avoid using lotion and oils.  Sleep on clean sheets if bath or shower is done the night before procedure.    Morning of your procedure:  Brush your teeth and tongue  Do not shave the area where you will have surgery.  Remove nail polish, makeup and all jewelry (including piercings) before coming to the hospital.  Dress in clean, loose, comfortable clothing.    Substance use guidance:  We recommend stopping smoking, vaping, and excessive alcohol use between now and surgery.   We recommend avoiding cannabis products 3 days before surgery.  The morning of your procedure, do not smoke, vape, or chew tobacco, do not drink any alcohol.    Valuables  Leave money, credit cards, jewelry, and any other valuables at home. If medications are being filled and picked up at a [place] pharmacy, payment will be needed. [facility] is not responsible for the loss or breakage of personal items.    What to bring to the hospital  ID/Insurance card  Medical Device card  Official documents for legal guardianship  Copy of your Living Will, Advanced Directives, and/or Durable Power of Attorney. If you have these documents, please bring them to the admissions office on the day of your surgery to be scanned into your records.  Do not bring medications from home unless instructed by a pharmacist.  Cases for glasses, hearing aids, contact lenses (bring solution for contacts)    Notify your surgeon if:  There is a possibility that you are pregnant.  You become ill with a cough, fever, sore throat, nausea, vomiting or flu-like symptoms.  You have any open wounds/sores that are red, painful, draining, or are new since you last saw the doctor.  You need to cancel your procedure.    Preparing to get your medications at discharge  Your surgeon may prescribe you medications to take after your procedure.  If you like the convenience of having your medications filled here at [facility], please do the following:  Go to [facility] pharmacy after your [facility] appointment to put a credit card on file.  Bring a credit card or cash on the day of your procedure- please leave with a family member rather than bringing it into the preop area.    Current Visitor Policy:  Visitors must be free of fever and symptoms to be in our facilities.  No more than 2 visitors per patient are allowed.  Additional guidelines may vary, based on patient care area or patient's condition.  Patients in semiprivate rooms may have visitors, but visits should be coordinated so only two total visitors are in a room at a time due to space limitations.  Children younger than age 38 are allowed to visit inpatients.  Please dress in layers as some areas may be colder or warmer than others.    Important phone numbers  To put a credit card on file call [facility]:  [phone]  For any medication changes or questions call pharmacy at [phone]  For arrival time questions (after 4:30pm the business day prior to surgery) call [phone]  For day of surgery cancellation or to notify us  for a late arrival call [phone]    If you have any changes to your health or hospitalizations between now and your surgery, or pre-op questions please call us  at [phone].    Instructions given to patient via: MyChart and verbal    Thank you for participating in your Preoperative Assessment visit today.

## 2024-08-07 NOTE — Progress Notes [1]
 Preoperative Medication Plan Note:    Matthew Paul was given pre-op medication instructions for upcoming procedure scheduled on 08/24/24 with Dr. Erskin.      Aspirin : per Gracelyn RN w. Dr. Moore(PCP), approved Aspirin  hold for 7 days prior to procedure, with the last dose on 08/16/24.    The plan above was communicated to the patient via Telephone.    [name], PHARMD

## 2024-08-12 ENCOUNTER — Encounter: Admit: 2024-08-12 | Discharge: 2024-08-12 | Payer: BLUE CROSS/BLUE SHIELD

## 2024-08-23 ENCOUNTER — Encounter: Admit: 2024-08-23 | Discharge: 2024-08-23 | Payer: BLUE CROSS/BLUE SHIELD

## 2024-08-23 DIAGNOSIS — Q612 Polycystic kidney, adult type: Principal | ICD-10-CM

## 2024-08-24 ENCOUNTER — Encounter: Admit: 2024-08-24 | Discharge: 2024-08-24 | Payer: BLUE CROSS/BLUE SHIELD

## 2024-08-24 MED ORDER — ALBUMIN, HUMAN 5 % 250 ML IV SOLP (AN)(OSM)
INTRAVENOUS | 0 refills | Status: DC
Start: 2024-08-24 — End: 2024-08-25

## 2024-08-24 MED ORDER — SUGAMMADEX 100 MG/ML IV SOLN
INTRAVENOUS | 0 refills | Status: DC
Start: 2024-08-24 — End: 2024-08-25

## 2024-08-24 MED ORDER — FENTANYL CITRATE (PF) 50 MCG/ML IJ SOLN
INTRAVENOUS | 0 refills | Status: DC
Start: 2024-08-24 — End: 2024-08-25

## 2024-08-24 MED ORDER — PROPOFOL INJ 10 MG/ML IV VIAL
INTRAVENOUS | 0 refills | Status: DC
Start: 2024-08-24 — End: 2024-08-25

## 2024-08-24 MED ORDER — FENTANYL CITRATE (PF) 50 MCG/ML IJ SOLN
50 ug | INTRAVENOUS | 0 refills | Status: DC | PRN
Start: 2024-08-24 — End: 2024-08-25
  Administered 2024-08-25 (×2): 50 ug via INTRAVENOUS

## 2024-08-24 MED ORDER — VASOPRESSIN 20 UNITS/20ML SYR (1 UNIT/ML) (AN) (OSM)
INTRAVENOUS | 0 refills | Status: DC
Start: 2024-08-24 — End: 2024-08-25

## 2024-08-24 MED ORDER — ACETAMINOPHEN 1,000 MG/100 ML (10 MG/ML) IV SOLN
INTRAVENOUS | 0 refills | Status: DC
Start: 2024-08-24 — End: 2024-08-25

## 2024-08-24 MED ORDER — ROCURONIUM 10 MG/ML IV SOLN
INTRAVENOUS | 0 refills | Status: DC
Start: 2024-08-24 — End: 2024-08-25

## 2024-08-24 MED ORDER — ACETAMINOPHEN 500 MG PO TAB
1000 mg | Freq: Once | ORAL | 0 refills | Status: DC | PRN
Start: 2024-08-24 — End: 2024-08-25

## 2024-08-24 MED ORDER — OXYCODONE 5 MG PO TAB
5 mg | ORAL | 0 refills | Status: DC | PRN
Start: 2024-08-24 — End: 2024-08-25
  Administered 2024-08-25 (×2): 5 mg via ORAL

## 2024-08-24 MED ORDER — ONDANSETRON 4 MG PO TBDI
4 mg | ORAL | 0 refills | Status: DC | PRN
Start: 2024-08-24 — End: 2024-08-27

## 2024-08-24 MED ORDER — PHENYLEPHRINE HCL IN 0.9% NACL 1 MG/10 ML (100 MCG/ML) IV SYRG
INTRAVENOUS | 0 refills | Status: DC
Start: 2024-08-24 — End: 2024-08-25

## 2024-08-24 MED ORDER — CALCIUM CARBONATE 200 MG CALCIUM (500 MG) PO CHEW
500 mg | ORAL | 0 refills | Status: DC | PRN
Start: 2024-08-24 — End: 2024-08-25

## 2024-08-24 MED ORDER — SENNOSIDES-DOCUSATE SODIUM 8.6-50 MG PO TAB
1 | Freq: Two times a day (BID) | ORAL | 0 refills | Status: DC
Start: 2024-08-24 — End: 2024-08-27
  Administered 2024-08-25 – 2024-08-27 (×2): 1 via ORAL

## 2024-08-24 MED ORDER — PHENYLEPHRINE 80MCG/ML IN NS IV DRIP (STD CONC)
INTRAVENOUS | 0 refills | Status: DC
Start: 2024-08-24 — End: 2024-08-25

## 2024-08-24 MED ORDER — LIDOCAINE (PF) 20 MG/ML (2 %) IJ SOLN
INTRAVENOUS | 0 refills | Status: DC
Start: 2024-08-24 — End: 2024-08-25

## 2024-08-24 MED ORDER — HYDROCHLOROTHIAZIDE 25 MG PO TAB
25 mg | Freq: Every morning | ORAL | 0 refills | Status: DC
Start: 2024-08-24 — End: 2024-08-30

## 2024-08-24 MED ORDER — ACETAMINOPHEN 500 MG PO TAB
1000 mg | ORAL | 0 refills | Status: DC
Start: 2024-08-24 — End: 2024-08-25
  Administered 2024-08-25 (×3): 1000 mg via ORAL

## 2024-08-24 MED ORDER — DEXAMETHASONE SODIUM PHOSPHATE 4 MG/ML IJ SOLN
INTRAVENOUS | 0 refills | Status: DC
Start: 2024-08-24 — End: 2024-08-25

## 2024-08-24 MED ORDER — OXYCODONE 5 MG PO TAB
5 mg | ORAL | 0 refills | Status: DC | PRN
Start: 2024-08-24 — End: 2024-08-25
  Administered 2024-08-25: 5 mg via ORAL

## 2024-08-24 MED ORDER — MELATONIN 5 MG PO TAB
5 mg | Freq: Every evening | ORAL | 0 refills | Status: DC | PRN
Start: 2024-08-24 — End: 2024-08-25
  Administered 2024-08-25: 03:00:00 5 mg via ORAL

## 2024-08-24 MED ORDER — ACETAMINOPHEN 1,000 MG/100 ML (10 MG/ML) IV SOLN
1000 mg | Freq: Once | INTRAVENOUS | 0 refills | Status: DC | PRN
Start: 2024-08-24 — End: 2024-08-25

## 2024-08-24 MED ORDER — BACITRACIN ZINC 500 UNIT/GRAM TP OINT
Freq: Two times a day (BID) | TOPICAL | 0 refills | Status: DC
Start: 2024-08-24 — End: 2024-09-10
  Administered 2024-08-25 – 2024-09-08 (×3): via TOPICAL

## 2024-08-24 MED ORDER — FENTANYL CITRATE (PF) 50 MCG/ML IJ SOLN
25 ug | INTRAVENOUS | 0 refills | Status: DC | PRN
Start: 2024-08-24 — End: 2024-08-25

## 2024-08-24 MED ORDER — METOCLOPRAMIDE HCL 5 MG/ML IJ SOLN
10 mg | Freq: Once | INTRAVENOUS | 0 refills | Status: DC | PRN
Start: 2024-08-24 — End: 2024-08-25

## 2024-08-24 MED ORDER — SODIUM CHLORIDE 0.9% IV BOLUS
1000 mL | Freq: Once | INTRAVENOUS | 0 refills | Status: DC
Start: 2024-08-24 — End: 2024-08-25
  Administered 2024-08-24: 17:00:00 1000 mL via INTRAVENOUS

## 2024-08-24 MED ORDER — ONDANSETRON HCL (PF) 4 MG/2 ML IJ SOLN
INTRAVENOUS | 0 refills | Status: DC
Start: 2024-08-24 — End: 2024-08-25

## 2024-08-24 MED ORDER — SIMETHICONE 80 MG PO CHEW
80 mg | ORAL | 0 refills | Status: DC | PRN
Start: 2024-08-24 — End: 2024-08-25

## 2024-08-24 MED ORDER — CEFAZOLIN 3 GRAM IV SOLR
3 g | Freq: Once | INTRAVENOUS | 0 refills | Status: CP
Start: 2024-08-24 — End: ?
  Administered 2024-08-24 (×2): 3 g via INTRAVENOUS

## 2024-08-24 MED ORDER — HYOSCYAMINE SULFATE 0.125 MG PO TBDI
.125 mg | SUBLINGUAL | 0 refills | Status: DC | PRN
Start: 2024-08-24 — End: 2024-08-25

## 2024-08-24 MED ORDER — GLYCOPYRROLATE 0.2 MG/ML IJ SOLN
INTRAVENOUS | 0 refills | Status: DC
Start: 2024-08-24 — End: 2024-08-25

## 2024-08-24 MED ORDER — SODIUM CHLORIDE 0.9% IV SOLP
INTRAVENOUS | 0 refills | Status: DC
Start: 2024-08-24 — End: 2024-08-25
  Administered 2024-08-25: 03:00:00 1000.0000 mL via INTRAVENOUS

## 2024-08-24 MED ORDER — ALLOPURINOL 100 MG PO TAB
100 mg | Freq: Every day | ORAL | 0 refills | Status: DC
Start: 2024-08-24 — End: 2024-09-10
  Administered 2024-08-25 – 2024-09-10 (×9): 100 mg via ORAL

## 2024-08-24 MED ORDER — OXYBUTYNIN CHLORIDE 5 MG PO TAB
5 mg | Freq: Three times a day (TID) | ORAL | 0 refills | Status: DC | PRN
Start: 2024-08-24 — End: 2024-08-25
  Administered 2024-08-25: 03:00:00 5 mg via ORAL

## 2024-08-24 MED ORDER — DEXMEDETOMIDINE IN 0.9 % NACL 20 MCG/5 ML (4 MCG/ML) IV SYRG
INTRAVENOUS | 0 refills | Status: DC
Start: 2024-08-24 — End: 2024-08-25

## 2024-08-24 MED ORDER — ARTIFICIAL TEARS (PF) SINGLE DOSE DROPS GROUP
OPHTHALMIC | 0 refills | Status: DC
Start: 2024-08-24 — End: 2024-08-25

## 2024-08-24 MED ORDER — POLYETHYLENE GLYCOL 3350 17 GRAM PO PWPK
1 | Freq: Two times a day (BID) | ORAL | 0 refills | Status: DC
Start: 2024-08-24 — End: 2024-08-27
  Administered 2024-08-25 – 2024-08-27 (×2): 17 g via ORAL

## 2024-08-24 MED ORDER — BUPIVACAINE HCL 0.25 % (2.5 MG/ML) IJ SOLN
0 refills | Status: DC
Start: 2024-08-24 — End: 2024-08-24

## 2024-08-24 MED ORDER — HEPARIN, PORCINE (PF) 5,000 UNIT/0.5 ML IJ SYRG
5000 [IU] | SUBCUTANEOUS | 0 refills | Status: DC
Start: 2024-08-24 — End: 2024-08-25
  Administered 2024-08-25 (×2): 5000 [IU] via SUBCUTANEOUS

## 2024-08-24 MED ORDER — DIPHENHYDRAMINE HCL 50 MG/ML IJ SOLN
25 mg | Freq: Once | INTRAVENOUS | 0 refills | Status: DC | PRN
Start: 2024-08-24 — End: 2024-08-25

## 2024-08-24 MED ORDER — FENTANYL CITRATE (PF) 50 MCG/ML IJ SOLN
25 ug | INTRAVENOUS | 0 refills | Status: DC | PRN
Start: 2024-08-24 — End: 2024-08-25
  Administered 2024-08-25: 04:00:00 25 ug via INTRAVENOUS

## 2024-08-24 MED ORDER — PANTOPRAZOLE 40 MG PO TBEC
40 mg | Freq: Every day | ORAL | 0 refills | Status: DC
Start: 2024-08-24 — End: 2024-08-26
  Administered 2024-08-25: 16:00:00 40 mg via ORAL

## 2024-08-24 MED ORDER — ONDANSETRON HCL (PF) 4 MG/2 ML IJ SOLN
4 mg | INTRAVENOUS | 0 refills | Status: DC | PRN
Start: 2024-08-24 — End: 2024-08-27

## 2024-08-24 MED ORDER — ATORVASTATIN 10 MG PO TAB
10 mg | Freq: Every day | ORAL | 0 refills | Status: DC
Start: 2024-08-24 — End: 2024-08-30
  Administered 2024-08-25: 16:00:00 10 mg via ORAL

## 2024-08-24 NOTE — Progress Notes [1]
 Night 1900-0700    Pain: Pain is controlled with current analgesics. Medications being used: Acetaminophen , Antispasmodics, Fentanyl  IV, and oxycodone  PO    Nutrition: Diet- Diet: Regular    Activity: Ambulation Episodes: 1 walk during shift    Incentive Spirometer this shift: Yes  Max Volume: 2000      Acute Events, Nursing Intervention or Provider Communication: No acute overnight events noted at this time, Requiring supplemental o2 overnight, After AM walk around unit, patient was able to maintain o2 sats above 90% on room air, no reports of SOA, Patient encouraged to utilize IS and practice deep breathing exercises.     Patient Interventions and Education   Fall Risk/JHFRAT Interventions and Education: (Charting when applicable)   Elimination Interventions: N/A   Medications: Use of gait belt , Stay within arm's reach during toileting/showering (i.e., dizziness, orthostasis) , and Educate patient on medication side effects   Patient Care Equipment: Needs assistance with patient care equipment when ambulating   Mobility: Assist x1   Cognition: Stay within arm's reach while patient ambulating/toileting/showering   Risk for Moderate/Major Injury: Active Anticoagulation and Surgery/procedure requiring anesthesia within past 24 hours     Restraints: No  Restraints Goal: choice: N/A  See Docflowsheet for restraint documentation, interventions, education, etc.    Intake and Output:      Date 08/24/24 0701 - 08/25/24 0700 08/25/24 0701 - 08/26/24 0700   Shift 0701-1900 1901-0700 24 Hour Total 0701-1900 1901-0700 24 Hour Total   INTAKE   P.O.  1500 1500      I.V.(mL/kg/hr) 1650(1) 956.7 2606.7      Shift Total(mL/kg) 8349(87.5) 2456.7(18.3) 4106.7(30.6)      OUTPUT   Urine(mL/kg/hr) 1300(0.8) 925 2225        Urine 1300  1300        Urine Output (mL) (Indwelling Urinary Catheter 16 FR Standard 2-way)  925 925      Drains  360 360        Drain Output (mL) (Surgical Drain Bulb (e.g. JP) Left Abdomen #1)  360 360      Shift Total(mL/kg) 1300(9.7) 1285(9.6) 7414(80.6)      NET 350 1171.7 1521.7      Weight (kg) 133.4 134.3 134.3 134.3 134.3 134.3         This RN provided education to patient. The following education topics were reviewed with patient: Catheter, Post Op Pathway, DVT Prevention, and Importance of Ambulation Patient Verbalizes Understanding and Demonstrated Understanding     Patient Belongings observed in room during this RN's shift: Cell Phone    Discharge Plan: Discharge Planning: Patient has a ride    Olita Sprang, CHARITY FUNDRAISER

## 2024-08-24 NOTE — H&P [4]
 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 male with a PMH notable for ADPKD with enlarging bilateral renal size causing pressure and discomfort who presents for LEFT robotic renal cyst decortication, cystoscopy, and possible ureteral stent placement (plan for RIGHT side in 4-6 weeks).    Risk and benefits of operative procedure were again discussed with the patient in detail; all questions were answered.     PLAN:  > To OR for above surgery  > E-consent was signed and available in chart  > Marking: LEFT  > Positioning: Lateral decubitus with LEFT side up  > Anesthesia Requests: General with paralysis  > Antibiotics: Ancef    --cultures reviewed as below.   > Anticoagulation plan: ASA81  > Pre-procedural labs to be drawn: CBC and BMP    Plan of care to be directed by the staff surgeon, Dr. Duchene    Aneta Hendershott E Saad Buhl, MD  Endourology Fellow, PGY-6  __________________________________________________________________________________    HPI: 58 y.o. male with a PMH notable for ADPKD with enlarging bilateral renal size causing pressure and discomfort who presents for LEFT robotic renal cyst decortication, cystoscopy, and possible ureteral stent placement (plan for RIGHT side in 4-6 weeks).    The patient denies any recent significant changes to his health since last seen. Patient denies any history of trouble with anesthesia, bleeding problems, and confirms they have been NPO since midnight.    Patient also specifically denies current fevers, chills, chest pain, SOB, nausea, vomiting, diarrhea, signs and symptoms of UTI.     Patient accompanied by Significant Other, [name].     Current anticoagulation: ASA81, held preoperatively    Prior urine cultures:  Lab Results   Component Value Date    CULTURE NO GROWTH 09/10/2019 CULTURE NO GROWTH 05/21/2019          Lab Results   Component Value Date/Time    CR 2.85 (H) 07/03/2024 10:31 AM    CR 2.74 (H) 05/01/2024 11:37 AM    CR 2.36 (H) 01/25/2024 12:24 PM    CR 2.59 (H) 10/24/2023 11:52 AM    CR 2.69 (H) 08/30/2023 12:02 PM    CR 2.56 (H) 08/03/2023 12:46 PM    CR 2.27 (H) 06/30/2023 11:32 AM    CR 2.20 (H) 04/01/2023 12:07 PM    CR 1.92 (H) 12/28/2022 12:00 AM    CR 2.04 (H) 10/07/2022 11:22 AM       Past Medical History:    Acid reflux    ADPKD (autosomal dominant polycystic kidney)    Apnea    Gout    Hyperlipidemia    Hypertension    Mild shortness of breath    Paroxysmal A-fib (CMS-HCC)       Surgical History:   Procedure Laterality Date    HX HERNIA REPAIR Right 1983    ROBOTIC LAPAROSCOPIC ABLATION RENAL CYSTS Right 06/08/2019    Performed by Erskin Lenis, MD at [facility] OR    ROBOT ASSISTED SURGERY N/A 06/08/2019    Performed by Erskin Lenis, MD at [facility] OR    CYSTOURETHROSCOPY WITH INDWELLING URETERAL STENT INSERTION Right 06/08/2019    Performed by Erskin Lenis, MD at [facility] OR    ROBOTIC ASSISTED LAPAROSCOPIC RENAL CYST DECORTICATION Left 10/02/2019    Performed by Erskin Lenis,  MD at [facility] OR    CYSTOURETHROSCOPY WITH INDWELLING URETERAL STENT INSERTION Left 10/02/2019    Performed by Erskin Lenis, MD at [facility] OR    HX TONSILLECTOMY      KIDNEY CYST REMOVAL      Multiple    KNEE CARTILAGE SURGERY Bilateral        Medications:  Medications Prior to Admission   Medication Sig    acetaminophen  (TYLENOL ) 325 mg tablet Take two tablets by mouth every 4 hours as needed.    allopurinoL  (ZYLOPRIM ) 100 mg tablet TAKE ONE TABLET BY MOUTH DAILY. TAKE WITH FOOD.    ascorbic acid 1,000 mg tablet Take one tablet by mouth daily.    aspirin  81 mg chewable tablet Chew one tablet by mouth daily. Take with food.    atorvastatin  (LIPITOR) 20 mg tablet Take one-half tablet by mouth daily.    calcium  carbonate (CALCIUM  600 PO) Take 1 tablet by mouth daily.    cetirizine  (ZYRTEC ) 10 mg tablet Take one tablet by mouth daily as needed for Allergy symptoms. Takes either cetirizine  or fexofenadine.    cholecalciferol  (VITAMIN D-3) 400 unit tab tablet Take one tablet by mouth daily.    colchicine  0.6 mg tablet Take one tablet by mouth as Needed (for gout flares).    fexofenadine HCl (ALLEGRA PO) Take 1 tablet by mouth daily as needed. Takes either cetirizine  or fexofenadine.    FLAXSEED OIL PO Take 1 tablet by mouth daily.    hydroCHLOROthiazide  (HYDRODIURIL ) 25 mg tablet Take one tablet by mouth every morning.    JYNARQUE  60 mg (AM)/ 30 mg (PM) TbSQ Take sixty mg by mouth every morning AND thirty mg every evening.    lisinopril  (PRINIVIL ; ZESTRIL ) 20 mg tablet Take one tablet by mouth daily.    omeprazole DR (PRILOSEC) 40 mg capsule Take one capsule by mouth daily.    polyethylene glycol 3350  (MIRALAX ) 17 g packet Take one packet by mouth twice daily. (Patient taking differently: Take one packet by mouth daily as needed (constipation).)    predniSONE  (DELTASONE ) 20 mg tablet Take one tablet by mouth as Needed.    vitamin E 400 unit capsule Take one capsule by mouth daily.    vitamins, B complex tab Take two tablets by mouth daily.       Allergies:  Seasonal allergies    Social History     Socioeconomic History    Marital status: Divorced   Tobacco Use    Smoking status: Never    Smokeless tobacco: Former     Types: Chew     Quit date: 1990    Tobacco comments:     Chewed x10 years   Vaping Use    Vaping status: Never Used   Substance and Sexual Activity    Alcohol use: Yes     Alcohol/week: 2.0 standard drinks of alcohol     Types: 2 Cans of beer per week     Comment: Occasional    Drug use: Never       No family history on file.    Vitals:  Vital Signs: Last Filed In 24 Hours Vital Signs: 24 Hour Range   BP: 112/90 (11/14 1100)  Temp: 36.5 ?C (97.7 ?F) (11/14 1039)  Pulse: 82 (11/14 1100)  Respirations: 18 PER MINUTE (11/14 1100)  SpO2: 92 % (11/14 1100)  O2 Device: None (Room air) (11/14 1100)  Height: 200.7 cm (6' 7) (11/14 1039) BP: (112-138)/(89-90)   Temp:  [36.5 ?C (97.7 ?  F)]   Pulse:  [82-83]   Respirations:  [15 PER MINUTE-18 PER MINUTE]   SpO2:  [92 %-95 %]   O2 Device: None (Room air)          Intake/Output:  No intake or output data in the 24 hours ending 08/24/24 1219    Physical Exam:   GEN:  Well nourished, in no distress, alert, oriented  HEENT:  Normocephalic/atraumatic  CHEST:  Unlabored on RA, symmetric chest rise  CV: Regular rate on monitor  ABD: soft, non-tender, non-distended, no rebound or guarding  EXT:  Strength/sensation grossly intact bilaterally    ROS:  A complete  ROS was obtained and was without pertinent positive or negatives except for those outlined in HPI    Lab/Radiology/Other Diagnostic Tests:  No results for input(s): HGB, HCT, WBC, PLTCT, NA, K, CL, CO2, BUN, CR, GLU, CA, MG, PO4, ALBUMIN , TOTPROT, TOTBILI, AST, ALT, ALKPHOS, AMY, LIPASE, PREALB, INR, PT, PTT in the last 72 hours.       No orders to display       Active Problems:    * No active hospital problems. *

## 2024-08-25 ENCOUNTER — Encounter: Admit: 2024-08-25 | Discharge: 2024-08-25 | Payer: BLUE CROSS/BLUE SHIELD

## 2024-08-25 ENCOUNTER — Inpatient Hospital Stay: Admit: 2024-08-25 | Discharge: 2024-08-25 | Payer: BLUE CROSS/BLUE SHIELD

## 2024-08-25 LAB — TYPE & CROSSMATCH
~~LOC~~ BKR ANTIBODY SCREEN: NEGATIVE mmol/L (ref 137–147)
~~LOC~~ BKR UNITS ORDERED: 0 mmol/L (ref 98–110)

## 2024-08-25 LAB — 2D + DOPPLER ECHO
AORTIC VALVE STROKE VOLUME INDEX: 19
AV INDEX (NATIVE): 0.8
AV PEAK VELOCITY: 1 m/s
BSA: 2.8 m2
DOP CALC LVOT AREA: 4.9 cm2
DOP CALC LVOT DIAMETER: 2.5 cm
DOP CALC LVOT PEAK VEL VTI: 10 cm
DOP CALC LVOT PEAK VEL: 0.8 m/s
DOP CALC LVOT STROKE VOLUME: 52 cm3
E WAVE DECELARTION TIME: 189 ms
E/A RATIO: 0.6
EJECTION FRACTION: 54 %
FRACTIONAL SHORTENING: 31 % (ref 28–44)
INTERVENTRICULAR SEPTUM: 1.1 cm (ref 0.6–1)
LATERAL E/E' RATIO: 3.6
LEFT ATRIUM INDEX: 14 mL/m2 (ref 16–34)
LEFT ATRIUM SIZE: 4.1 cm (ref 3–4)
LEFT ATRIUM VOLUME: 41 mL (ref 18–58)
LEFT INTERNAL DIMENSION IN SYSTOLE: 3.2 cm (ref 2.5–4)
LEFT VENTRICLE DIASTOLIC VOLUME INDEX: 35 mL/m2 (ref 34–74)
LEFT VENTRICLE DIASTOLIC VOLUME: 100 mL (ref 62–150)
LEFT VENTRICLE MASS INDEX: 62 g/m2 (ref 49–115)
LEFT VENTRICLE SYSTOLIC VOLUME INDEX: 17 mL/m2 (ref 11–31)
LEFT VENTRICLE SYSTOLIC VOLUME: 48 mL (ref 21–61)
LEFT VENTRICULAR INTERNAL DIMENSION IN DIASTOLE: 4.7 cm (ref 4.2–5.8)
LEFT VENTRICULAR MASS: 176 g (ref 88–224)
MEDIAL E/E' RATIO: 8
MV PEAK A VEL: 0.6 m/s
MV PEAK E VEL PW: 0.4 m/s
POSTERIOR WALL: 1 cm (ref 0.6–1)
PROX AORTA: 3.6 cm (ref 2.6–3.8)
RELATIVE WALL THICKNESS: 0.4 (ref <=0.42)
RIGHT ATRIAL AREA: 19 cm2 (ref <18)
RIGHT HEART SYSTOLIC MMODE TAPSE: 1.8 cm (ref >1.7)
RIGHT HEART SYSTOLIC TDI S': 0.1 m/s
RIGHT VENTRICULAR BASAL DIAMETER: 4.1 cm (ref 2.5–4.1)
RV SYSTOLIC PRESSURE: 34
SINUS: 3.8 cm (ref 2.8–4)
TDI LATERAL E': 0.1 m/s
TDI MEDIAL E': 0 m/s
TR PEAK VELOCITY: 2.9 m/s

## 2024-08-25 LAB — LIPID PROFILE
~~LOC~~ BKR CHOLESTEROL: 124 mg/dL (ref <200)
~~LOC~~ BKR HDL: 33 mg/dL — ABNORMAL LOW (ref >40)
~~LOC~~ BKR LDL: 81 mg/dL (ref <100.00)
~~LOC~~ BKR NON HDL CHOLESTEROL: 91 mg/dL
~~LOC~~ BKR TRIGLYCERIDES: 208 mg/dL — ABNORMAL HIGH (ref <150)
~~LOC~~ BKR VLDL: 41 mg/dL

## 2024-08-25 LAB — CBC
~~LOC~~ BKR HEMATOCRIT: 40 % — ABNORMAL HIGH (ref 40.0–50.0)
~~LOC~~ BKR HEMATOCRIT: 43 % (ref 40.0–50.0)
~~LOC~~ BKR HEMOGLOBIN: 13 g/dL — ABNORMAL LOW (ref 13.5–16.5)
~~LOC~~ BKR HEMOGLOBIN: 14 g/dL (ref 13.5–16.5)
~~LOC~~ BKR MCH: 31 pg (ref 26.0–34.0)
~~LOC~~ BKR MCH: 30 pg (ref 26.0–34.0)
~~LOC~~ BKR MCHC: 32 g/dL (ref 32.0–36.0)
~~LOC~~ BKR MCHC: 32 g/dL (ref 32.0–36.0)
~~LOC~~ BKR MCV: 95 fL — ABNORMAL HIGH (ref 80.0–100.0)
~~LOC~~ BKR MCV: 95 fL (ref 80.0–100.0)
~~LOC~~ BKR MPV: 8.8 fL (ref 7.0–11.0)
~~LOC~~ BKR MPV: 8.8 fL (ref 7.0–11.0)
~~LOC~~ BKR PLATELET COUNT: 176 10*3/uL — ABNORMAL LOW (ref 150–400)
~~LOC~~ BKR PLATELET COUNT: 204 10*3/uL (ref 150–400)
~~LOC~~ BKR RBC COUNT: 4.2 10*6/uL — ABNORMAL LOW (ref 4.40–5.50)
~~LOC~~ BKR RBC COUNT: 4.5 10*6/uL (ref 4.40–5.50)
~~LOC~~ BKR RDW: 14 % (ref 11.0–15.0)
~~LOC~~ BKR RDW: 15 % — ABNORMAL HIGH (ref 11.0–15.0)
~~LOC~~ BKR WBC COUNT: 9.2 10*3/uL — ABNORMAL HIGH (ref 4.50–11.00)
~~LOC~~ BKR WBC COUNT: 17 10*3/uL — ABNORMAL HIGH (ref 4.50–11.00)

## 2024-08-25 LAB — COMPREHENSIVE METABOLIC PANEL
~~LOC~~ BKR ALBUMIN: 3.8 g/dL (ref 3.5–5.0)
~~LOC~~ BKR ALK PHOSPHATASE: 67 U/L (ref 25–110)
~~LOC~~ BKR ALT: 163 U/L — ABNORMAL HIGH (ref 7–56)
~~LOC~~ BKR ANION GAP: 15 — ABNORMAL HIGH (ref 3–12)
~~LOC~~ BKR AST: 157 U/L — ABNORMAL HIGH (ref 7–40)
~~LOC~~ BKR BLD UREA NITROGEN: 50 mg/dL — ABNORMAL HIGH (ref 7–25)
~~LOC~~ BKR CALCIUM: 9.9 mg/dL (ref 8.5–10.6)
~~LOC~~ BKR CO2: 17 mmol/L — ABNORMAL LOW (ref 21–30)
~~LOC~~ BKR CREATININE: 3.6 mg/dL — ABNORMAL HIGH (ref 0.40–1.24)
~~LOC~~ BKR GLOMERULAR FILTRATION RATE (GFR): 18 mL/min — ABNORMAL LOW (ref >60)
~~LOC~~ BKR TOTAL BILIRUBIN: 0.4 mg/dL (ref 0.2–1.3)
~~LOC~~ BKR TOTAL PROTEIN: 6.3 g/dL (ref 6.0–8.0)

## 2024-08-25 LAB — BLOOD GASES, ARTERIAL
~~LOC~~ BKR BASE DEFICIT-ART: 8.1 mmol/L
~~LOC~~ BKR BASE DEFICIT-ART: 8.9 mmol/L
~~LOC~~ BKR BICARB, ART(CAL): 18 mmol/L — ABNORMAL LOW (ref 21.0–28.0)
~~LOC~~ BKR BICARB, ART(CAL): 17 mmol/L — ABNORMAL LOW (ref 21.0–28.0)
~~LOC~~ BKR FIO2 VALUE-ART: 100 %
~~LOC~~ BKR FIO2 VALUE-ART: 80 %
~~LOC~~ BKR O2 SAT-ART: 97 % (ref 95.0–99.0)
~~LOC~~ BKR O2 SAT-ART: 96 % (ref 95.0–99.0)
~~LOC~~ BKR PCO2-ART: 51 mmHg — ABNORMAL HIGH (ref 35–45)
~~LOC~~ BKR PCO2-ART: 49 mmHg — ABNORMAL HIGH (ref 35–45)
~~LOC~~ BKR PH-ART: 7.2 — ABNORMAL LOW (ref 7.35–7.45)
~~LOC~~ BKR PH-ART: 7.2 — ABNORMAL LOW (ref 7.35–7.45)
~~LOC~~ BKR PO2-ART: 227 mmHg — ABNORMAL HIGH (ref 80–100)
~~LOC~~ BKR PO2-ART: 110 mmHg — ABNORMAL HIGH (ref 80–100)

## 2024-08-25 LAB — POC BLOOD GAS ARTERIAL
~~LOC~~ BKR POC BASE DEF ART: 13 mmol/L (ref 40–50)
~~LOC~~ BKR POC BICARB, ART: 18 mmol/L — ABNORMAL LOW (ref 21–28)
~~LOC~~ BKR POC CO2, ART: 71 mmHg — ABNORMAL HIGH (ref 35–45)
~~LOC~~ BKR POC O2 SAT, ART: 83 % — ABNORMAL LOW (ref 95–99)
~~LOC~~ BKR POC O2, ART: 71 mmHg — ABNORMAL LOW (ref 80–100)
~~LOC~~ BKR POC PH, ART: 7 — CL (ref 7.35–7.45)

## 2024-08-25 LAB — POC GLUCOSE
~~LOC~~ BKR POC GLUCOSE: 110 mg/dL — ABNORMAL HIGH (ref 70–100)
~~LOC~~ BKR POC GLUCOSE: 183 mg/dL — ABNORMAL HIGH (ref 70–100)

## 2024-08-25 LAB — HIGH SENSITIVITY TROPONIN I 0 HOUR: ~~LOC~~ BKR HIGH SENSITIVITY TROPONIN I 0 HOUR: 137 ng/L — ABNORMAL HIGH (ref <20.0)

## 2024-08-25 LAB — BASIC METABOLIC PANEL: ~~LOC~~ BKR SODIUM, SERUM: 141 mmol/L (ref 137–147)

## 2024-08-25 LAB — LACTIC ACID(LACTATE): ~~LOC~~ BKR LACTIC ACID: 2 mmol/L (ref 0.5–2.0)

## 2024-08-25 LAB — POC LACTATE: ~~LOC~~ BKR POC LACTIC ACID: 7.7 mmol/L — ABNORMAL HIGH (ref 0.5–2.0)

## 2024-08-25 LAB — POC POTASSIUM: ~~LOC~~ BKR POC POTASSIUM: 4.3 mmol/L (ref 3.5–5.1)

## 2024-08-25 MED ORDER — SODIUM CHLORIDE 0.9 % IJ SOLN
10 mL | Freq: Once | INTRAVENOUS | 0 refills | Status: CP
Start: 2024-08-25 — End: ?
  Administered 2024-08-25: 21:00:00 10 mL via INTRAVENOUS

## 2024-08-25 MED ORDER — EPINEPHRINE 0.1 MG/ML IJ SYRG
0 refills | Status: CP
Start: 2024-08-25 — End: ?

## 2024-08-25 MED ORDER — POLYETHYLENE GLYCOL 3350 17 GRAM/DOSE PO POWD
17 g | Freq: Every day | ORAL | 0 refills | 22.00000 days | Status: DC
Start: 2024-08-25 — End: 2024-09-10

## 2024-08-25 MED ORDER — PERFLUTREN LIPID MICROSPHERES 1.1 MG/ML IV SUSP
1-10 mL | Freq: Once | INTRAVENOUS | 0 refills | Status: CP | PRN
Start: 2024-08-25 — End: ?
  Administered 2024-08-25: 21:00:00 6 mL via INTRAVENOUS

## 2024-08-25 MED ORDER — OXYCODONE 5 MG PO TAB
5 mg | ORAL_TABLET | ORAL | 0 refills | 6.00000 days | Status: DC | PRN
Start: 2024-08-25 — End: 2024-09-10

## 2024-08-25 MED ORDER — SODIUM CHLORIDE 0.9 % TKO FLUID
INTRAVENOUS | 0 refills | Status: DC
Start: 2024-08-25 — End: 2024-09-10
  Administered 2024-08-26 – 2024-08-31 (×2): via INTRAVENOUS

## 2024-08-25 MED ORDER — METHOCARBAMOL 750 MG PO TAB
750 mg | ORAL | 0 refills | Status: DC
Start: 2024-08-25 — End: 2024-08-25
  Administered 2024-08-25: 16:00:00 750 mg via ORAL

## 2024-08-25 MED ORDER — IOHEXOL 350 MG IODINE/ML IV SOLN
100 mL | Freq: Once | INTRAVENOUS | 0 refills | Status: CP
Start: 2024-08-25 — End: ?
  Administered 2024-08-25: 22:00:00 100 mL via INTRAVENOUS

## 2024-08-25 MED ORDER — HEPARIN (PORCINE) INITIAL BOLUS FOR CONTINUOUS INF (BAG)
10000 [IU] | Freq: Once | INTRAVENOUS | 0 refills | Status: CP
Start: 2024-08-25 — End: ?

## 2024-08-25 MED ORDER — INSULIN ASPART 100 UNIT/ML SC FLEXPEN
0-6 [IU] | Freq: Before meals | SUBCUTANEOUS | 0 refills | Status: DC
Start: 2024-08-25 — End: 2024-08-26
  Administered 2024-08-26: 05:00:00 1 [IU] via SUBCUTANEOUS

## 2024-08-25 MED ORDER — DEXTROSE 50 % IN WATER (D50W) IV SYRG
12.5-25 g | INTRAVENOUS | 0 refills | Status: DC | PRN
Start: 2024-08-25 — End: 2024-09-10

## 2024-08-25 MED ORDER — MAGNESIUM SULFATE IN D5W 1 GRAM/100 ML IV PGBK
1 g | Freq: Once | INTRAVENOUS | 0 refills | Status: CP
Start: 2024-08-25 — End: ?
  Administered 2024-08-25: 20:00:00 1 g via INTRAVENOUS

## 2024-08-25 MED ORDER — ACETAMINOPHEN 500 MG PO TAB
1000 mg | ORAL | 0 refills | 8.00000 days | Status: AC | PRN
Start: 2024-08-25 — End: ?

## 2024-08-25 MED ORDER — ACETAMINOPHEN 325 MG PO TAB
650 mg | ORAL | 0 refills | Status: DC | PRN
Start: 2024-08-25 — End: 2024-08-27

## 2024-08-25 MED ORDER — SIMETHICONE 80 MG PO CHEW
80 mg | ORAL_TABLET | ORAL | 0 refills | Status: DC | PRN
Start: 2024-08-25 — End: 2024-09-10

## 2024-08-25 MED ORDER — SODIUM CHLORIDE 0.9 % IJ SOLN
50 mL | Freq: Once | INTRAVENOUS | 0 refills | Status: CP
Start: 2024-08-25 — End: ?
  Administered 2024-08-25: 22:00:00 50 mL via INTRAVENOUS

## 2024-08-25 MED ORDER — PROPOFOL 10 MG/ML IV EMUL
5-70 ug/kg/min | INTRAVENOUS | 0 refills | Status: DC
Start: 2024-08-25 — End: 2024-08-30
  Administered 2024-08-25: 18:00:00 20 ug/kg/min via INTRAVENOUS
  Administered 2024-08-25: 23:00:00 60 ug/kg/min via INTRAVENOUS
  Administered 2024-08-26: 16:00:00 35 ug/kg/min via INTRAVENOUS
  Administered 2024-08-26 (×2): 30 ug/kg/min via INTRAVENOUS
  Administered 2024-08-26: 22:00:00 45 ug/kg/min via INTRAVENOUS
  Administered 2024-08-26 (×2): 30 ug/kg/min via INTRAVENOUS
  Administered 2024-08-26 (×2): 45 ug/kg/min via INTRAVENOUS
  Administered 2024-08-27: 08:00:00 35 ug/kg/min via INTRAVENOUS
  Administered 2024-08-27: 02:00:00 40 ug/kg/min via INTRAVENOUS
  Administered 2024-08-27 (×2): 35 ug/kg/min via INTRAVENOUS
  Administered 2024-08-27: 05:00:00 40 ug/kg/min via INTRAVENOUS
  Administered 2024-08-27: 20:00:00 35 ug/kg/min via INTRAVENOUS
  Administered 2024-08-27: 23:00:00 45 ug/kg/min via INTRAVENOUS
  Administered 2024-08-27: 17:00:00 35 ug/kg/min via INTRAVENOUS
  Administered 2024-08-28 (×3): 20 ug/kg/min via INTRAVENOUS
  Administered 2024-08-28: 02:00:00 45 ug/kg/min via INTRAVENOUS
  Administered 2024-08-28: 18:00:00 20 ug/kg/min via INTRAVENOUS
  Administered 2024-08-29 (×2): 25 ug/kg/min via INTRAVENOUS
  Administered 2024-08-29: 05:00:00 15 ug/kg/min via INTRAVENOUS
  Administered 2024-08-29: 15:00:00 25 ug/kg/min via INTRAVENOUS
  Administered 2024-08-29: 09:00:00 20 ug/kg/min via INTRAVENOUS
  Administered 2024-08-30: 12:00:00 30 ug/kg/min via INTRAVENOUS
  Administered 2024-08-30: 04:00:00 20 ug/kg/min via INTRAVENOUS
  Administered 2024-08-30: 09:00:00 25 ug/kg/min via INTRAVENOUS

## 2024-08-25 MED ORDER — HEPARIN (PORCINE) BOLUS FOR CONTINUOUS INFUSION (BAG) - APTT MAIN
20-40 [IU]/kg | INTRAVENOUS | 0 refills | Status: DC | PRN
Start: 2024-08-25 — End: 2024-08-26

## 2024-08-25 MED ORDER — VASOPRESSIN IN 0.9 % SOD CHLOR 20 UNIT/100 ML (0.2 UNIT/ML) IV SOLN
1.8 [IU]/h | INTRAVENOUS | 0 refills | Status: DC
Start: 2024-08-25 — End: 2024-08-29
  Administered 2024-08-26 – 2024-08-27 (×6): 1.8 [IU]/h via INTRAVENOUS

## 2024-08-25 MED ORDER — FENTANYL CITRATE (PF) 50 MCG/ML IJ SOLN
50 ug | Freq: Once | INTRAVENOUS | 0 refills | Status: CP
Start: 2024-08-25 — End: ?
  Administered 2024-08-25: 18:00:00 50 ug via INTRAVENOUS

## 2024-08-25 MED ORDER — SENNOSIDES-DOCUSATE SODIUM 8.6-50 MG PO TAB
1 | ORAL_TABLET | Freq: Two times a day (BID) | ORAL | 0 refills | 30.00000 days | Status: DC
Start: 2024-08-25 — End: 2024-09-10

## 2024-08-25 MED ORDER — FENTANYL DRIP IN NS 1000MCG/100ML
10-70 ug/h | INTRAVENOUS | 0 refills | Status: DC
Start: 2024-08-25 — End: 2024-08-31
  Administered 2024-08-25: 18:00:00 20 ug/h via INTRAVENOUS
  Administered 2024-08-26 – 2024-08-27 (×2): 30 ug/h via INTRAVENOUS
  Administered 2024-08-29: 01:00:00 35 ug/h via INTRAVENOUS
  Administered 2024-08-30: 10:00:00 30 ug/h via INTRAVENOUS
  Administered 2024-08-31: 11:00:00 50 ug/h via INTRAVENOUS

## 2024-08-25 MED ORDER — CALCIUM CHLORIDE 100 MG/ML (10 %) IV SYRG
0 refills | Status: CP
Start: 2024-08-25 — End: ?

## 2024-08-25 MED ORDER — NOREPINEPHRINE BITARTRATE-D5W 4 MG/250 ML (16 MCG/ML) IV SOLN
0-.5 ug/kg/min | INTRAVENOUS | 0 refills | Status: DC
Start: 2024-08-25 — End: 2024-08-26
  Administered 2024-08-25: 21:00:00 0.23 ug/kg/min via INTRAVENOUS
  Administered 2024-08-25: 18:00:00 0.05 ug/kg/min via INTRAVENOUS
  Administered 2024-08-25: 18:00:00 0.35 ug/kg/min via INTRAVENOUS
  Administered 2024-08-25: 23:00:00 0.3 ug/kg/min via INTRAVENOUS
  Administered 2024-08-26: 06:00:00 0.24 ug/kg/min via INTRAVENOUS
  Administered 2024-08-26 (×2): 0.3 ug/kg/min via INTRAVENOUS
  Administered 2024-08-26: 04:00:00 0.26 ug/kg/min via INTRAVENOUS
  Administered 2024-08-26: 13:00:00 0.3 ug/kg/min via INTRAVENOUS
  Administered 2024-08-26: 09:00:00 0.28 ug/kg/min via INTRAVENOUS

## 2024-08-25 MED ORDER — WHITE PETROLATUM-MINERAL OIL 57.7-31.9 % OP OINT
.25 [in_us] | OPHTHALMIC | 0 refills | Status: DC
Start: 2024-08-25 — End: 2024-09-02
  Administered 2024-08-25: 21:00:00 0.25 [in_us] via OPHTHALMIC

## 2024-08-25 MED ORDER — METHOCARBAMOL 750 MG PO TAB
750 mg | ORAL_TABLET | Freq: Three times a day (TID) | ORAL | 0 refills | 10.00000 days | Status: DC | PRN
Start: 2024-08-25 — End: 2024-09-10

## 2024-08-25 MED ORDER — NALOXONE 0.4 MG/ML IJ SOLN
.08 mg | INTRAVENOUS | 0 refills | Status: DC | PRN
Start: 2024-08-25 — End: 2024-08-25

## 2024-08-25 MED ORDER — FENTANYL (SUBLIMAZE) BOLUS FOR CONTINUOUS INFUSION
25 ug | INTRAVENOUS | 0 refills | Status: DC | PRN
Start: 2024-08-25 — End: 2024-08-31

## 2024-08-25 MED ORDER — HEPARIN (PORCINE) IN 5 % DEX 25,000 UNIT/250 ML(100 UNIT/ML) IV SOLP
0-3000 [IU]/h | INTRAVENOUS | 0 refills | Status: DC
Start: 2024-08-25 — End: 2024-08-26
  Administered 2024-08-26 (×2): 2000 [IU]/h via INTRAVENOUS

## 2024-08-25 NOTE — Progress Notes [1]
 Urology Code Blue Note:  Matthew Paul is a 58 y.o. male with a history of Afib, HTN, HLD, ADPKD s/p robotic assisted left renal cyst decortication on 08/24/2024. Case was uncomplicated. Patient was set to discharge today. Labs this morning significant for Hgb 13.2, K 5.3, Cr 3.2 (baseline ~2.5).     Urology notified that code blue called due to unresponsiveness and no pulse. Upon my arrival, code blue team had achieved ROSC after 4-5 rounds of CPR with 2 rounds of epinephrine  after PEA. Per bedside RN, patient was standing up with family and then unresponsive. Discussed with his family. He was stretching after standing up and had a witnessed fall. He did not hit his head.     Work up in process at this time. Greatly appreciate Code Blue Team and MICU assistance in management.     Plan:  - Greatly appreciate MICU and Code Blue Team assistance in management  - Work up per MICU team; low suspicion for surgical complications at this time  - Ok for anticoagulation from urologic standpoint if necessary  - Recommend Foley catheter placement, maintain JP  - Urology to follow. Please page with questions or concerns.    Debby JULIANNA Room, MD  Urology Resident

## 2024-08-25 NOTE — Consults [2]
 CARDIOLOGY CONSULT NOTE                         Date of Admit: 08/24/2024  Date of service: 08/25/2024 2:22 PM      Reason for Consult:  Post cardiac arrest, concern for acs          History of Present Illness  Matthew Paul is a 58 y.o. 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  Patient had cardiac arrest on 11/15.  Patient reportedly had a relatively uncomplicated surgery on 11/14 and was set to discharge today 11/15.  Per family, patient stood up out of bed and had been standing for about a minute, when he stated he felt lightheaded, put his arms up and subsequently went unconscious and fell back.  [facility] was called with PEA arrest, successful ROSC and transferred to the MICU.  He is requiring norepinephrine  and vasopressin  for blood pressure support and is intubated and sedated.  Per patient's friends, patient has no significant cardiac history.  He is a non-smoker.  They believe his dad had a heart attack but otherwise no significant cardiac history in the family.  They do not recall the patient ever complaining of chest pain recently or today.      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  Cardiology consulted due to concern for EKG changes postarrest.    Review of initial EKG immediately postarrest shows right bundle branch block with concern for ST elevation in anterior septal leads.  ST changes are somewhat atypical and may be related to metabolic derangements/acidosis.  EKG also appears to be atrial fibrillation as there are no clear P waves with an irregular rhythm.  No prior EKG available for review.    Repeat EKG at 1400 shows similar RBBB and ST changes, but less impressive than initial EKG.    Initial high-sensitivity troponin 137.9.    On my point-of-care echo, the EF seems probably preserved, the RV appears at least mildly dilated with at least mildly reduced function and mild septal flattening.  On review of a prior CT scan of the abdomen and pelvis in 2023, there did not appear to be any coronary calcification.    Unclear etiology of patient's cardiac arrest, as his point-of-care potassium was 4.3.  Reportedly this was PEA arrest, so electrolyte abnormalities and PE are on the differential.    PLAN/Recommendations:  Currently, with the available information, would not treat as ACS at this time.  Will follow-up troponin trend and remaining labs.  Formal echo ordered  Pending results of the above, will decide further workup including possible invasive coronary angiography if needed  Continue to treat metabolic derangements.  Agree with further workup for possible PE and other etiologies of arrest, per MICU team  Initial EKG did show concern for atrial fibrillation immediately post arrest.  Telemetry has since only showed sinus rhythm.  Would hold off on anticoagulation unless other indication aside from A-fib arises.  Monitor on telemetry for any recurrence.  Will follow  Thank you for allowing us  to participate in the care of this patient. Discussed with Dr. Kvapil who agrees with plan above.  After 5PM please call Cardiology fellow on call. Monday - Friday 8AM-5PM please call Cardiology consult pager.    Fairy Pinch, MD  Fellow, Cardiovascular Medicine           Home Medications  Medications Prior to Admission   Medication Sig    [DISCONTINUED] acetaminophen  (TYLENOL ) 325 mg tablet Take two tablets by mouth every 4 hours as needed.    allopurinoL  (ZYLOPRIM ) 100 mg tablet TAKE ONE TABLET BY MOUTH DAILY. TAKE WITH FOOD.    ascorbic acid 1,000 mg tablet Take one tablet by mouth daily. aspirin  81 mg chewable tablet Chew one tablet by mouth daily. Take with food.    atorvastatin  (LIPITOR) 20 mg tablet Take one-half tablet by mouth daily.    calcium  carbonate (CALCIUM  600 PO) Take 1 tablet by mouth daily.    cetirizine  (ZYRTEC ) 10 mg tablet Take one tablet by mouth daily as needed for Allergy symptoms. Takes either cetirizine  or fexofenadine.    cholecalciferol  (VITAMIN D-3) 400 unit tab tablet Take one tablet by mouth daily.    colchicine  0.6 mg tablet Take one tablet by mouth as Needed (for gout flares).    fexofenadine HCl (ALLEGRA PO) Take 1 tablet by mouth daily as needed. Takes either cetirizine  or fexofenadine.    FLAXSEED OIL PO Take 1 tablet by mouth daily.    [Paused] hydroCHLOROthiazide  (HYDRODIURIL ) 25 mg tablet Take one tablet by mouth every morning.    JYNARQUE  60 mg (AM)/ 30 mg (PM) TbSQ Take sixty mg by mouth every morning AND thirty mg every evening.    [Paused] lisinopril  (PRINIVIL ; ZESTRIL ) 20 mg tablet Take one tablet by mouth daily.    omeprazole DR (PRILOSEC) 40 mg capsule Take one capsule by mouth daily.    [DISCONTINUED] polyethylene glycol 3350  (MIRALAX ) 17 g packet Take one packet by mouth twice daily. (Patient taking differently: Take one packet by mouth daily as needed (constipation).)    predniSONE  (DELTASONE ) 20 mg tablet Take one tablet by mouth as Needed.    vitamin E 400 unit capsule Take one capsule by mouth daily.    vitamins, B complex tab Take two tablets by mouth daily.       Current Medications  Scheduled Meds:acetaminophen  (TYLENOL  EXTRA STRENGTH) tablet 1,000 mg, 1,000 mg, Oral, Q6H*  [Held by Provider] allopurinoL  (ZYLOPRIM ) tablet 100 mg, 100 mg, Oral, QDAY  atorvastatin  (LIPITOR) tablet 10 mg, 10 mg, Oral, QDAY  bacitracin  zinc  topical ointment, , Topical, BID  heparin  (porcine) PF syringe 5,000 Units, 5,000 Units, Subcutaneous, Q8H  [Held by Provider] hydroCHLOROthiazide  (HYDRODIURIL ) tablet 25 mg, 25 mg, Oral, QAM8  magnesium  sulfate   1 g/D5W 100 mL IVPB, 1 g, Intravenous, ONCE  pantoprazole  DR (PROTONIX ) tablet 40 mg, 40 mg, Oral, QDAY  petrolatum  (STYE) ophthalmic ointment 0.25 inch, 0.25 inch, Both Eyes, Q6H  polyethylene glycol 3350  (MIRALAX ) packet 17 g, 1 packet, Oral, BID  sennosides-docusate sodium  (SENOKOT-S) tablet 1 tablet, 1 tablet, Oral, BID  sodium chloride  PF 0.9% injection 10 mL, 10 mL, Intravenous, ONCE  VASOPRESSIN  IN 0.9 % SOD CHLOR 20 UNIT/100 ML (0.2 UNIT/ML) IV SOLN (Cabinet Override), , , NOW    Continuous Infusions:   fentaNYL  (SUBLIMAZE ) 1000 mcg/100 mL NS IV drip (std conc)(premade) 50 mcg/hr (08/25/24 1327)    norepinephrine  (LEVOPHED ) 4 mg in dextrose  5% (D5W) 250 mL IV drip (std conc) 0.32  mcg/kg/min (08/25/24 1404)    propofoL  (DIPRIVAN ) 10 mg/mL IV drip 60 mcg/kg/min (08/25/24 1327)    sodium chloride  0.9% TKO infusion      vasopressin  (VASOSTRICT ) 20 units in sodium chloride  0.9% (NS) 100 mL IV infusion (std conc)(premade) 1.8 Units/hr (08/25/24 1221)     PRN and Respiratory Meds:fentaNYL  TITRATE **AND** fentaNYL  Q30 MIN PRN, ondansetron  Q6H PRN **OR** ondansetron  Q6H PRN, perflutren  lipid microspheres Once PRN **AND** sodium chloride  PF 0.9% ONCE       Allergies  Allergies[1]    Past Medical History  Past Medical History:    Acid reflux    ADPKD (autosomal dominant polycystic kidney)    Apnea    Gout    Hyperlipidemia    Hypertension    Mild shortness of breath    Paroxysmal A-fib (CMS-HCC)       Past Surgical History  Surgical History:   Procedure Laterality Date    HX HERNIA REPAIR Right 1983    ROBOTIC LAPAROSCOPIC ABLATION RENAL CYSTS Right 06/08/2019    Performed by Erskin Lenis, MD at [facility] OR    ROBOT ASSISTED SURGERY N/A 06/08/2019    Performed by Erskin Lenis, MD at [facility] OR    CYSTOURETHROSCOPY WITH INDWELLING URETERAL STENT INSERTION Right 06/08/2019    Performed by Erskin Lenis, MD at [facility] OR    ROBOTIC ASSISTED LAPAROSCOPIC RENAL CYST DECORTICATION Left 10/02/2019    Performed by Erskin Lenis, MD at [facility] OR CYSTOURETHROSCOPY WITH INDWELLING URETERAL STENT INSERTION Left 10/02/2019    Performed by Erskin Lenis, MD at [facility] OR    HX TONSILLECTOMY      KIDNEY CYST REMOVAL      Multiple    KNEE CARTILAGE SURGERY Bilateral        Social History  Social History     Socioeconomic History    Marital status: Divorced   Tobacco Use    Smoking status: Never    Smokeless tobacco: Former     Types: Chew     Quit date: 1990    Tobacco comments:     Chewed x10 years   Vaping Use    Vaping status: Never Used   Substance and Sexual Activity    Alcohol use: Yes     Alcohol/week: 2.0 standard drinks of alcohol     Types: 2 Cans of beer per week     Comment: Occasional    Drug use: Never       Family History  No family history on file.    Review of Systems    Unable to obtain due to patient factors                         Vital Signs: Most Recent                 Vital Signs: 24 Hour Range   BP: 116/104 (11/15 1227)  Temp: 36.8 ?C (98.3 ?F) (11/15 1227)  Pulse: 104 (11/15 1400)  Respirations: 18 PER MINUTE (11/15 1400)  SpO2: 98 % (11/15 1400)  O2%: 100 % (11/15 1400)  O2 Device: Ventilator (11/15 1205)  O2 Liter Flow: 3 Lpm (11/15 0405)  Height: 200.7 cm (6' 7.02) (11/15 1227) BP: (63-139)/(47-104)   ABP: (96-107)/(61-62)   Temp:  [36.2 ?C (97.1 ?F)-36.8 ?C (98.3 ?F)]   Pulse:  [76-188]   Respirations:  [11 PER MINUTE-22 PER MINUTE]   SpO2:  [84 %-98 %]   O2%:  [100 %]  O2 Device: Ventilator  O2 Liter Flow: 3 Lpm     Vitals:    08/24/24 1039 08/24/24 2115 08/25/24 1227   Weight: 133.4 kg (294 lb) 134.3 kg (296 lb) (!) 144.5 kg (318 lb 9 oz)       Intake/Output Summary (Last 24 hours) at 08/25/2024 1422  Last data filed at 08/25/2024 1300  Gross per 24 hour   Intake 3506.67 ml   Output 2030 ml   Net 1476.67 ml           Physical Exam     General Appearance: Intubated and sedated  Eyes: conjunctivae and lids normal  Neck: no JVD, no thyromegaly   Cardiovascular system: regular, S1 S2 normal, no murmurs, no rub   Respiratory system: CTAB, unlabored breathing, on RA  Extremities: no peripheral edema, peripheral pulses intact  Abdomen: soft, drain in place on left side of abdomen  Neurologic: Intubated sedated        Lab/Radiology/Other Diagnostic Tests:  24-hour labs:    Results for orders placed or performed during the hospital encounter of 08/24/24 (from the past 24 hours)   BASIC METABOLIC PANEL    Collection Time: 08/25/24  3:36 AM   Result Value Ref Range    Sodium 141 137 - 147 mmol/L    Potassium 5.3 (H) 3.5 - 5.1 mmol/L    Chloride 109 98 - 110 mmol/L    Glucose 133 (H) 70 - 100 mg/dL    Blood Urea Nitrogen 47 (H) 7 - 25 mg/dL    Creatinine 6.76 (H) 0.40 - 1.24 mg/dL    Calcium  8.8 8.5 - 10.6 mg/dL    CO2 23 21 - 30 mmol/L    Anion Gap 9 3 - 12    Glomerular Filtration Rate (GFR) 21 (L) >60 mL/min   CBC    Collection Time: 08/25/24  3:36 AM   Result Value Ref Range    White Blood Cells 9.20 4.50 - 11.00 10*3/uL    Red Blood Cells 4.24 (L) 4.40 - 5.50 10*6/uL    Hemoglobin 13.2 (L) 13.5 - 16.5 g/dL    Hematocrit 59.5 59.9 - 50.0 %    MCV 95.4 80.0 - 100.0 fL    MCH 31.2 26.0 - 34.0 pg    MCHC 32.7 32.0 - 36.0 g/dL    RDW 85.2 88.9 - 84.9 %    Platelet Count 176 150 - 400 10*3/uL    MPV 8.8 7.0 - 11.0 fL   POC GLUCOSE    Collection Time: 08/25/24 11:31 AM   Result Value Ref Range    Glucose, POC 110 (H) 70 - 100 mg/dL   POC BLOOD GAS ARTERIAL    Collection Time: 08/25/24 11:45 AM   Result Value Ref Range    PH-ART-POC 7.02 (LL) 7.35 - 7.45    PCO2-ART-POC 71 (HH) 35 - 45 mm[Hg]    PO2-ART-POC 71 (L) 80 - 100 mm[Hg]    Base Def-ART-POC 13 mmol/L    O2 Sat-ART-POC 83 (L) 95 - 99 %    Bicarbonate-ART-POC 18 (L) 21 - 28 mmol/L   POC HEMATOCRIT&HEMOGLOBIN    Collection Time: 08/25/24 11:45 AM   Result Value Ref Range    Hemoglobin POC 15.3 13.5 - 16.5 g/dL    Hematocrit POC 45 40 - 50 %   POC SODIUM    Collection Time: 08/25/24 11:45 AM   Result Value Ref Range    SODIUM-POC 139 137 - 147 mmol/L   POC POTASSIUM  Collection Time: 08/25/24 11:45 AM Result Value Ref Range    Potassium-POC 4.3 3.5 - 5.1 mmol/L   POC LACTATE    Collection Time: 08/25/24 11:45 AM   Result Value Ref Range    LACTIC ACID POC 7.7 (HH) 0.5 - 2.0 mmol/L   BLOOD GASES, ARTERIAL    Collection Time: 08/25/24 12:58 PM   Result Value Ref Range    pH 7.21 (L) 7.35 - 7.45    pCO2-Arterial 51 (H) 35 - 45 mmHg    pO2-Arterial 227 (H) 80 - 100 mmHg    Base Deficit-Arterial 8.1 mmol/L    O2 SAT-Arterial 97.7 95.0 - 99.0 %    Bicarbonate-ART-Cal 18.0 (L) 21.0 - 28.0 mmol/L    FiO2 Value Arterial 100 %   TYPE & CROSSMATCH    Collection Time: 08/25/24 12:58 PM   Result Value Ref Range    ABO/RH(D) A POS     Antibody Screen NEG     Crossmatch Expires 08/28/2024,2359     Units Ordered 0     Record Check FOUND    COMPREHENSIVE METABOLIC PANEL    Collection Time: 08/25/24 12:58 PM   Result Value Ref Range    Sodium 137 137 - 147 mmol/L    Potassium 4.9 3.5 - 5.1 mmol/L    Chloride 105 98 - 110 mmol/L    Glucose 207 (H) 70 - 100 mg/dL    Blood Urea Nitrogen 50 (H) 7 - 25 mg/dL    Creatinine 6.30 (H) 0.40 - 1.24 mg/dL    Calcium  9.9 8.5 - 10.6 mg/dL    Total Protein 6.3 6.0 - 8.0 g/dL    Total Bilirubin 0.4 0.2 - 1.3 mg/dL    Albumin  3.8 3.5 - 5.0 g/dL    Alk Phosphatase 67 25 - 110 U/L    AST 157 (H) 7 - 40 U/L    ALT 163 (H) 7 - 56 U/L    CO2 17 (L) 21 - 30 mmol/L    Anion Gap 15 (H) 3 - 12    Glomerular Filtration Rate (GFR) 18 (L) >60 mL/min   MAGNESIUM     Collection Time: 08/25/24 12:58 PM   Result Value Ref Range    Magnesium  2.0 1.6 - 2.6 mg/dL   CBC    Collection Time: 08/25/24 12:58 PM   Result Value Ref Range    White Blood Cells 17.70 (H) 4.50 - 11.00 10*3/uL    Red Blood Cells 4.54 4.40 - 5.50 10*6/uL    Hemoglobin 14.0 13.5 - 16.5 g/dL    Hematocrit 56.6 59.9 - 50.0 %    MCV 95.4 80.0 - 100.0 fL    MCH 30.8 26.0 - 34.0 pg    MCHC 32.3 32.0 - 36.0 g/dL    RDW 84.8 (H) 88.9 - 15.0 %    Platelet Count 204 150 - 400 10*3/uL    MPV 8.8 7.0 - 11.0 fL   HIGH SENSITIVITY TROPONIN I 0 HOUR Collection Time: 08/25/24 12:58 PM   Result Value Ref Range    hs Troponin I 0 Hour 137.9 (H) <20.0 ng/L   LACTIC ACID(LACTATE)    Collection Time: 08/25/24 12:58 PM   Result Value Ref Range    Lactic Acid 2.0 0.5 - 2.0 mmol/L   POC GLUCOSE    Collection Time: 08/25/24  1:57 PM   Result Value Ref Range    Glucose, POC 183 (H) 70 - 100 mg/dL     Glucose: (!) 792 (88/84/74 1258)  POC Glucose (Download): (!) 183 (08/25/24 1357)           [1]   Allergies  Allergen Reactions    Seasonal Allergies SNEEZING

## 2024-08-25 NOTE — Procedure - Bedside [600087]
 Procedure Note    Matthew Paul is a 58 y.o. male.      Arterial Line    Date/Time: 08/25/2024 12:59 PM    Performed by: Mata Rowen G, DO  Authorized by: Knox Alan CROME, MD  Consent: The procedure was performed in an emergent situation  Patient identity confirmed: arm band  Time out: Immediately prior to procedure a time out was called to verify the correct patient, procedure, equipment, support staff and site/side marked as required.  Preparation: Patient was prepped and draped in the usual sterile fashion.  Indications: multiple ABGs and hemodynamic monitoring  Location: left radial    Sedation:  Patient sedated: yes  Sedation type: Continuously sedated under mechanical ventilation protocol  Allen's test normal: yes  Needle gauge: 20  Seldinger technique: Seldinger technique used  Number of attempts: 2  Post-procedure: dressing applied  Post-procedure CMS: unchanged              Damien KANDICE Mix, DO

## 2024-08-25 NOTE — H&P [4]
 MICU History and Physical Examination      Name: Matthew Paul        Birthday: [birth year]                                MRN: [id number]    Admission Date: 08/24/2024                                                LOS: 0 days      Brief Hospital Course     Matthew Paul is a 58 y.o. M w/ PMHx of AD-PKD s/p left robotic renal cyst decortication on 11/14 by Urology without complication, CKD, HTN, HLD, remote history of Afib in 1980s not on AC.  Patient admitted and initially on 11/14 for planned left robotic renal cyst decortication and cystoscopy with ureteral stent placement.  No complications to procedure and patient had existing JP drain placed.  Unfortunately, patient suffered PEA cardiac arrest on 11/15 with ROSC following 2 rounds of epinephrine .  Patient was transported to the ICU and initially requiring high vent settings [PEEP 10, FiO2 100%] and pressor support with NE and vaso.  Cardiology consulted for abnormal EKG with new RBBB.  Mild metabolic derangements on initial labs without electrolyte disturbances.  Formal TTE stat with EF 55%, grade 1 diastolic dysfunction, and moderate to severely dilated LV with PASP 34.  CTA chest 11/15 showing massive bilateral PE, right greater than left with significant RV strain.  CT AP without evidence of hemorrhage or acute abdominal pathology.  After much discussion with family regarding risk/benefit, started on therapeutic heparin  drip 11/15.     Assessment & Plan     Principal Problem:    Acquired bilateral renal cysts  Active Problems:    ADPKD (autosomal dominant polycystic kidney disease)      NEURO/PSYCH  #Sedated   - Initially post-code: moving all extremities, opening eyes, attempting to pull at tube prior to sedation   Plan:  > Sedation w/ prop & fent, wean as able  > No concern for mentation at this time as requiring high levels of sedation, defer CTH.     PULMONARY  #Mechanical intubation for airway protection   #Bilateral massive PE  - Intubated on 11/15 post PEA arrest. ETT advanced to 28 at the teeth   - TTE: RV overload further c/f PE  - CTA Chest: multiple BL PE, R>L. Elevated RV:LV c/w right heart strain w/ dilation of RA, RV, main PA.   Plan:  Set VT (mL):  [500 milliliters]   Expired VT Spontaneous (mL):  [0 milliliters]   Expiratory VT (mL):  [321 mL]   Set RR:  [24 breaths/minutes]   Total Respiratory Rate:  [34 breaths/minutes]   Minute Volume (Lpm):  [11.1 liters/minutes]   %MVspontaneous:  [2 %]   PIP Actual (cmH2O):  [16 cm H20]   PEEP/CPAP (cmH2O):  [8 cm H2O]   Mean Airway Pressure (cmH2O):  [12 cm H2O]   Plateau Pressure (cmH2O):  [16 cm H2O]   > Repeat ABG w/ vent changes   > Bedside POCUS concerning for RV overload and McConnell's sign.   > CTA Chest showing bilateral massive PE.  Had a long discussion with family, pharmacy, interventional radiology, and overall multidisciplinary approach and discussion for tPA versus heparin .  Earlier in the conversation, patient was becoming more hemodynamically unstable and borderline needing to add a third pressor.  Thankfully, we were able to come down on sedation which decreased his pressor requirements.  With this, decided to start him on therapeutic heparin  and discussed dosing with pharmacy.  Discussed risks/benefit with family at length especially given his recent surgical procedure yesterday and existing JP drain, along with potential for existing cerebral aneurysms that are often associated with autosomal dominant PKD.  They understand this and would like to continue forward with heparin  GGT.     > Frequent neurovascular checks   > If becomes hemodynamically unstable or repeat cardiac arrest, will start tPA.  Pharmacy aware.    CARDIOVASCULAR  #PEA arrest   #Shock - obstructive 2/2 PE   #Remote history of Afib (1980s) not on AC   - PEA arrest on 11/15, ROSC after 2 rounds of epi and total code lasting 4-6 minutes.  - PTA: ASA 81 mg daily  - EKG:   Previous OSH 2020: SR, Qtc 406   EKG (11/15 post code): Sinus tachy 100s, Qtc 452, new RBBB  - Pressors: NE, vaso   - Echo (11/15): EF 55%. No dia dysfunction. Mod to severely enlarged RV w/ intraventricular septal flattening. PASP 35 + CVP. No major valvular abnormalities.   - CTA Chest: massive bl PE with RV strain.   - CT AP: normal post-surgical changes.   - Trop: 138   Plan: Ddx  > PEA arrest due to obstructive shock from massive bilateral PE.  He has a new right bundle branch block, likely due to this.  Discussed with interventional radiology, not amenable to thrombectomy at this time due to location.  Currently remains on norepi and vasopressin  for additional pressor support.    > Goal SBP >100 AND MAP > 65    #HLD   - PTA: atorvastatin  20 mg   - Lipid profile: chol 124, TG 208, HDL 33, LDL 82  Plan:  > HOLD atorvastatin  given LFTs     #HTN  - PTA meds: HCTZ 25 mg qAM, lisinopril  20 mg daily  Plan:  > HOLD HCTZ and lisinopril  - currently in multipressor shock       FEN/GI  #Elevated LFTs, likely post-code   - Post code LFTs AST 157 (13 on admit), ALT 163 (12 on admit). Tbili 0.4.   Plan:  > remove scheduled tylenol  and hold atorvastatin    > Daily CMP     #GERD  - PTA meds: omeprazole 40 mg daily   Plan:  > Continue PPI daily     RENAL  #ADPKD  #AKI on CKD   #HAGMA, mild likely 2/2 above further renal dysfunction  - PTA meds: jynarque  (valptan) 60 mg qAM / 30 mg qPM  - Baseline Cr 2.5 - 2.8   -11/14 underwent left robotic renal cyst decortication, cystoscopy, L ureteral stent placement for enlarging bilateral renal size causing pressure and discomfort in the setting of ADPKD.  Total of roughly 100 cysts were decorticated with 19 large cysts sent for pathology.  Placement of left ureteral stent, confirmation prior to surgical completion that left ureter had no urine leak.  No overt post surgical complications.  - CT AP (11/15): normal post-operative changes  Recent Labs     08/25/24  0336 08/25/24  1258   CR 3.23* 3.69*   BUN 47* 50*   GFR 21* 18*     - Net IO Since Admission: 876.67 mL [08/25/24  1547]  -   Intake/Output Summary (Last 24 hours) at 08/25/2024 1547  Last data filed at 08/25/2024 1300  Gross per 24 hour   Intake 3156.67 ml   Output 2030 ml   Net 1126.67 ml     Plan:  > JP drain in place with ~ 430 mL out in the last 24 hours, per urology suspected/usual amount of fluid drainage.  > Replace Foley catheter  > Worsening AKI: Strict I's and O's, avoid further nephrotoxic agents, renally dose medications as able.      ENDOCRINE  #No acute concerns  Recent Labs     08/25/24  1131 08/25/24  1357   GLUPOC 110* 183*     Plan:  > CTM      HEME/ONC  #Leukocytosis, likely reactive (post-surgical vs cardiac arrest)  Recent Labs     08/25/24  0336 08/25/24  1258   HGB 13.2* 14.0   MCV 95.4 95.4   PLTCT 176 204     Plan:  > Monitor HGB, Transfuse if Hgb <7 and PLT < 10   > DVT Ppx: Has been on heparin  prior to procedure on initial admission   > No evidence of acute bleeding, especially with uptrending hgb. Ongoing monitor for s/s of bleeding.   > If significant HD instability: obtain TEG, CBC STAT and if dropping hgb consider fast scan for eval of retroperitoneal bleed/hematoma. At this time, no concerns for such.     ID  #No acute infectious concerns   Recent Labs     08/25/24  0336 08/25/24  1258   WBC 9.20 17.70*     - Temp (24hrs), Avg:36.6 ?C (97.8 ?F), Min:36.2 ?C (97.1 ?F), Max:36.8 ?C (98.3 ?F)    - Antibiotics:   Ancef  x2 for perioperative abx   Plan:  > No acute infectious concerns       MSK/DERM  #Gout   > Hold PTA allopurinol , colchicine  0.6 mg prn for flares       LDA/PROPHYLAXIS  ICU Lines and Drains       Lines  Duration             CVC Triple Femoral, right <1 day              Catheters  Duration             Indwelling Urinary Catheter 16 FR 3-way <1 day                  Lines: PIV x2, CVC R femoral (11/15 -), R radial A line (11/15 -)  Tubes: surgical JP drain left abd (11/14 -), ETT (11/15 - )   Urinary Catheter:  Yes (11/15 - )   Antibiotic Usage:  None  VTE ppx: SCDs, heparin  ggt   GI:  PPI daily   Bowel regimen: PRNs provided   Diet: Regular Diet  DIET NPO Strict  Code status: Full CODE    Patient discussed with Alan LITTIE Constable, MD   Damien Mix, DO    _________________________________________________________________________  Subjective     History of Present Illness: Nicholaos is a 58 y.o. male with a history of autosomal dominant kidney disease and progressive renal failure secondary to this who was admitted on 11/14 for planned decortication of renal cysts and left ureteral stent placement.  Surgery went without complication with removal of over 100 renal cysts, 19 large cyst were sent for pathology.  Surgical JP drain placed in left abdomen without unusual blood/output.  Unfortunately, on 11/15 patient got up from the bed and was stretching when family witnessed to collapse.  On initial assessment, patient was without a pulse and chest compressions were initiated.  Patient found to be in PEA arrest and 2 rounds of epi were administered, obtained ROSC within 4-6 minutes.  Transferred to the ICU for higher level of care.    ROS:   Review of Systems   Unable to perform ROS: Critical illness        The patient  has a past medical history of Acid reflux, ADPKD (autosomal dominant polycystic kidney), Apnea, Gout (09/08/2019), Hyperlipidemia, Hypertension, Mild shortness of breath, and Paroxysmal A-fib (CMS-HCC).    The patient  has a past surgical history that includes Knee cartilage surgery (Bilateral); hernia repair (Right, 1983); Kidney cyst removal; tonsillectomy; Kidney cyst removal (Right, 06/08/2019); cystourethroscopy (Right, 06/08/2019); Kidney cyst removal (Left, 10/02/2019); and cystourethroscopy (Left, 10/02/2019).    Social History     Tobacco Use    Smoking status: Never    Smokeless tobacco: Former     Types: Chew     Quit date: 1990    Tobacco comments:     Chewed x10 years   Vaping Use    Vaping status: Never Used   Substance Use Topics    Alcohol use: Yes     Alcohol/week: 2.0 standard drinks of alcohol     Types: 2 Cans of beer per week     Comment: Occasional    Drug use: Never     family history is not on file.    Allergies:  Seasonal allergies      Objective     Scheduled Meds:[Held by Provider] allopurinoL  (ZYLOPRIM ) tablet 100 mg, 100 mg, Oral, QDAY  [Held by Provider] atorvastatin  (LIPITOR) tablet 10 mg, 10 mg, Oral, QDAY  bacitracin  zinc  topical ointment, , Topical, BID  [Held by Provider] hydroCHLOROthiazide  (HYDRODIURIL ) tablet 25 mg, 25 mg, Oral, QAM8  pantoprazole  DR (PROTONIX ) tablet 40 mg, 40 mg, Oral, QDAY  petrolatum  (STYE) ophthalmic ointment 0.25 inch, 0.25 inch, Both Eyes, Q6H  polyethylene glycol 3350  (MIRALAX ) packet 17 g, 1 packet, Oral, BID  sennosides-docusate sodium  (SENOKOT-S) tablet 1 tablet, 1 tablet, Oral, BID  VASOPRESSIN  IN 0.9 % SOD CHLOR 20 UNIT/100 ML (0.2 UNIT/ML) IV SOLN (Cabinet Override), , , NOW    Continuous Infusions:   fentaNYL  (SUBLIMAZE ) 1000 mcg/100 mL NS IV drip (std conc)(premade) 50 mcg/hr (08/25/24 1327)    norepinephrine  (LEVOPHED ) 4 mg in dextrose  5% (D5W) 250 mL IV drip (std conc) 0.23 mcg/kg/min (08/25/24 1518)    propofoL  (DIPRIVAN ) 10 mg/mL IV drip 60 mcg/kg/min (08/25/24 1327)    sodium chloride  0.9% TKO infusion      vasopressin  (VASOSTRICT ) 20 units in sodium chloride  0.9% (NS) 100 mL IV infusion (std conc)(premade) 1.8 Units/hr (08/25/24 1221)     PRN and Respiratory Meds:acetaminophen  Q6H PRN, fentaNYL  TITRATE **AND** fentaNYL  Q30 MIN PRN, ondansetron  Q6H PRN **OR** ondansetron  Q6H PRN                           Vital Signs: Last Filed                 Vital Signs: 24 Hour Range   BP: 119/67 (11/15 1509)  Temp: 36.8 ?C (98.3 ?F) (11/15 1227)  Pulse: 104 (11/15 1400)  Respirations: 18 PER MINUTE (11/15 1400)  SpO2: 98 % (11/15 1400)  O2%: 100 % (11/15 1400)  O2  Device: Ventilator (11/15 1205)  O2 Liter Flow: 3 Lpm (11/15 0405)  Height: 200.7 cm (6' 7) (11/15 1509) BP: (63-139)/(47-104)   ABP: (96-107)/(61-62)   Temp:  [36.2 ?C (97.1 ?F)-36.8 ?C (98.3 ?F)]   Pulse:  [76-188]   Respirations:  [11 PER MINUTE-22 PER MINUTE]   SpO2:  [84 %-98 %]   O2%:  [100 %]   O2 Device: Ventilator  O2 Liter Flow: 3 Lpm   Intensity Pain Scale (Self Report): 3 (08/25/24 9060) Vitals:    08/24/24 2115 08/25/24 1227 08/25/24 1509   Weight: 134.3 kg (296 lb) (!) 144.5 kg (318 lb 9 oz) (!) 144.2 kg (318 lb)         Physical Exam:  Constitutional: 58 y.o. male intubated and sedated  Head: Normocephalic, atraumatic  Cardiovascular: Regular rhythm, tachycardic rate  Pulmonary: Clear to auscultation bilaterally, no wheezes or rales  GI: Abdomen soft, non-tender, non-distended (baseline habitus)  Skin: No rashes or bruises, good turgor, cap refill <2s  Neuro: Opening eyes, moving all extremities spontaneously, and trying to pull at ET tube prior to increase of sedation.  Withdraws to pain.  Musculoskeletal: No deformities  Lymphatic/extremities: No pitting edema, pulses present b/l    Laboratory:  Recent Labs     08/25/24  0336 08/25/24  1258   NA 141 137   K 5.3* 4.9   CL 109 105   CO2 23 17*   GAP 9 15*   BUN 47* 50*   CR 3.23* 3.69*   GLU 133* 207*   CA 8.8 9.9   ALBUMIN   --  3.8   MG  --  2.0       Recent Labs     08/25/24  0336 08/25/24  1258   WBC 9.20 17.70*   HGB 13.2* 14.0   HCT 40.4 43.3   PLTCT 176 204   AST  --  157*   ALT  --  163*   ALKPHOS  --  67      Estimated Creatinine Clearance: 34.7 mL/min (A) (by C-G formula based on SCr of 3.69 mg/dL (H)).  Vitals:    08/24/24 2115 08/25/24 1227 08/25/24 1509   Weight: 134.3 kg (296 lb) (!) 144.5 kg (318 lb 9 oz) (!) 144.2 kg (318 lb)      Recent Labs     08/25/24  1258   PHART 7.21*   PO2ART 227*         Pertinent radiology reviewed.    Malnutrition Details:                                        Active Wounds

## 2024-08-25 NOTE — Progress Notes [1]
 Code/Rapid Response Medication Nurse Sign Off      Patient: Matthew Paul    I have reviewed the Code/Rapid Response Timeline Event Report and confirm that I administered the medications listed on this patient during the event on date 08/25/2024 at event start time 1131.

## 2024-08-25 NOTE — Progress Notes [1]
 Urology Progress Note 08/25/2024     Assessment/Plan:    Matthew Paul is a 58 y.o. Male with ADPKD with enlarging bilateral renal size causing pressure and s/p LEFT robotic renal cyst decortication (11/14).     - Pain: PO pain control  - Diet/FEN: Regular diet, SLIVF  - GI: Bowel regimen. Zofran  prn for nausea.  - GU: Remove foley this AM with trial of void, Monitor I&O   > Will discuss JP removal with staff today  - Renal: Replete electrolytes prn.   > Cr 3.23 (2.85)  - Heme/ID: Hgb 13.2, WBC 9.2   >SCD's  - Emphasized importance of OOB/Ambulation in hallways today    Disposition: Discharge planning today   > Plan for robotic right cyst decortication on 10/19/2024    Will discuss plan with staff surgeon - Dr. Marye for Dr. Erskin Debby JULIANNA Dino, MD  Please page Urology on-call with questions.  ________________________________________________________________________   SUBJECTIVE:     No acute events overnight. Pain is well controlled on the current regimen. Tolerating clear liquid diet without nausea or emesis. No flatus. no BM. Patient has ambulated.      OBJECTIVE:                   Vital Signs: Most Recent                Vital Signs: Past 24 Hours   BP: 113/81 (11/15 0405)  Temp: 36.6 ?C (97.9 ?F) (11/15 0405)  Pulse: 103 (11/15 0405)  Respirations: 16 PER MINUTE (11/15 0405)  SpO2: 96 % (11/15 0510)  O2 Device: None (Room air) (11/15 0510)  O2 Liter Flow: 3 Lpm (11/15 0405)  Height: 200.7 cm (6' 7) (11/14 1039)  Weight: 134.3 kg (296 lb) (11/14 2115)  BP: (100-139)/(65-90)   Temp:  [36.2 ?C (97.1 ?F)-36.8 ?C (98.2 ?F)]   Pulse:  [82-111]   Respirations:  [11 PER MINUTE-19 PER MINUTE]   SpO2:  [89 %-98 %]   O2 Device: None (Room air)  O2 Liter Flow: 3 Lpm       General: Alert & oriented; no acute distress  Pulm: Non-labored on room air  CV: Regular rate  Abd: Soft, appropriately tender, non-distended; no rebound or guarding  Incisions/Wounds: Laparoscopic incisions sewn with dermabond, non-erythematous and without drainage  Extremities: No edema; SCD's in place.  GU: Foley catheter in place draining clear pink tinged urine    Date 08/24/24 0701 - 08/25/24 0700 08/25/24 0701 - 08/26/24 0700   Shift 0701-1900 1901-0700 24 Hour Total 0701-1900 1901-0700 24 Hour Total   INTAKE   P.O.  1000 1000      I.V.(mL/kg/hr) 1650(1)  1650      Shift Total(mL/kg) 8349(87.5) 1000(7.4) 7349(80.2)      OUTPUT   Urine(mL/kg/hr) 1300(0.8) 925 2225        Urine 1300  1300        Urine Output (mL) (Indwelling Urinary Catheter 16 FR Standard 2-way)  925 925      Drains  360 360        Drain Output (mL) (Surgical Drain Bulb (e.g. JP) Left Abdomen #1)  360 360      Shift Total(mL/kg) 1300(9.7) 1285(9.6) 7414(80.6)      NET 350 -285 65      Weight (kg) 133.4 134.3 134.3 134.3 134.3 134.3       Labs:  Hematology                               Chemistry   Recent Labs     08/25/24  0336   WBC 9.20   HGB 13.2*   PLTCT 176    Recent Labs     08/25/24  0336   NA 141   K 5.3*   CL 109   CO2 23   BUN 47*   CR 3.23*   GFR 21*   GLU 133*   CA 8.8        Malnutrition Details:                                        ACTIVE PROBLEMS:  Principal Problem:    Acquired bilateral renal cysts  Active Problems:    ADPKD (autosomal dominant polycystic kidney disease)

## 2024-08-25 NOTE — Procedure - Bedside [600087]
 Procedure Note    Matthew Paul is a 58 y.o. male.      Arterial Line    Date/Time: 08/25/2024 6:58 PM    Performed by: Verginia Toohey G, DO  Authorized by: Knox Alan CROME, MD  Consent: The procedure was performed in an emergent situation  Patient identity confirmed: arm band  Time out: Immediately prior to procedure a time out was called to verify the correct patient, procedure, equipment, support staff and site/side marked as required.  Preparation: Patient was prepped and draped in the usual sterile fashion.  Indications: hemodynamic monitoring  Location: right radial    Sedation:  Patient sedated: yes  Sedation type: Continuously sedated under mechanical ventilation protocol  Allen's test normal: yes  Needle gauge: 20  Seldinger technique: Seldinger technique used  Number of attempts: 2  Post-procedure: dressing applied  Post-procedure CMS: unchanged              Damien KANDICE Mix, DO

## 2024-08-25 NOTE — Response Teams [600022]
 Code Blue Team Progress Note    Date: 08/25/2024 Time: 1137  Patient: Matthew Paul  Attending: Knox Alan CROME, MD Service: Med ICU 1 - [phone]  Admission Date: 08/24/2024  LOS: 0 days    A Code/Rapid Response Timeline Event Report has been created for this patient on 11/15 at 1129.     ETT Confirmation Method: Exhaled CO2 by color change.    The lead provider for this event was Roslynn Rover, DO    Leita Bamberger, RN

## 2024-08-25 NOTE — Procedure - Bedside [600087]
 Procedure Note    Matthew Paul is a 58 y.o. male.      Central Line Insertion    Date/Time: 08/25/2024 1:10 PM    Performed by: Jackquline Castleman, MD  Authorized by: Knox Alan CROME, MD  Consent: The procedure was performed in an emergent situation. Verbal consent not obtained. Written consent not obtained  Risks and benefits: risks, benefits and alternatives were discussed  Patient identity confirmed: arm band  Time out: Immediately prior to procedure a time out was called to verify the correct patient, procedure, equipment, support staff and site/side marked as required.    Indication: Medications requiring CV access and Vascular access  Diagnosis: Acute Respiratory Failure w/ Hypoxia and Other    Cardiac arrest    Sedation:  Patient sedated: yes  Sedation type: Continuously sedated under mechanical ventilation protocol  Preparation: skin prepped with chlorhexidine and skin prepped with betadine  Location details: right femoral  Patient position: flat  Catheter type: triple lumen  Pre-procedure: landmarks identified  Ultrasound guidance: yes  Number of attempts: 1  Successful placement: yes  Post-procedure: line sutured and dressing applied  Assessment: blood return through all parts  Patient tolerance: patient tolerated the procedure well with no immediate complications              Castleman Jackquline, MD

## 2024-08-25 NOTE — Progress Notes [1]
 Adult Mechanical Ventilator Liberation    Name: Matthew Paul   MRN: [id number]     DOB: 1966-05-10      Age: 58 y.o.  Admission Date: 08/24/2024     LOS: 0 days     Date of Service: 08/25/2024        Adult Mechanical Ventilator Liberation: Twice daily     Weaning Readiness Screen Met (RT Only):: No, no evidence of improvement of reversal of cause of respiratory failure (e.g. fluid overload, pneumonia, ARDS, etc.);No, PEEP > or equal to 9 cmH2O

## 2024-08-25 NOTE — Progress Notes [1]
 1700: Patient blood pressure goal MAP>65 AND sys >90 per E. Candi MD and RONAL Constable MD at bedside.  1815: Patient bilateral pedal, tibial, popliteal pulses absent. Bilateral femoral pulses palpable. Provider FORBES Candi MD notified. Orders obtained and carried out.

## 2024-08-25 NOTE — Progress Notes [1]
 RT Adult Assessment Note    NAME:Matthew Paul             MRN: [id number]             DOB:11/25/1965          AGE: 58 y.o.  ADMISSION DATE: 08/24/2024             DAYS ADMITTED: LOS: 0 days    Additional Comments:  Impressions of the patient: Pt laying in bed on 3L. Pt unlabored, but is shallow breathing due to procedure/pain.   Intervention(s)/outcome(s): IS and 02  Patient education that was completed: Educated on the importance of IS and to do 10X per hour.   Recommendations to the care team:     Vital Signs:  Pulse: 108  RR: 16 PER MINUTE  SpO2: 94 %  O2 Device: Nasal cannula  Liter Flow: 3 Lpm  O2%:      Breath Sounds: Decrease     Respiratory Effort: Unlabored/shallow     Comments:

## 2024-08-25 NOTE — Progress Notes [1]
 Chaplain Note:    Admit Date: 08/24/2024         Reason for Visit:  code blue page    Faith/Religion: None stated, not discussed.     Source of Purpose/Meaning:  Three family members present, spouse tearful and two other male family members working to comfort patient's spouse as well as work with care team on conveying information and understanding plan of care. According to family patient had sat down and raise his arms over his head to stretch, then stated 'I feel dizzy' and collapsed resulting in the cardiac event.     Worries/Concerns/Struggles:   Care team is working to obtain answers for family at this time, patient had a successful surgery and was scheduled to discharge today spouse stated 'I guess we are not going home now'. It was important to spouse that her phone number be updated in the chart, updated that for her, it was out of date.     Method(s) of Coping: Family involved in plan of care understand, patient once he became aware again had some involuntary arm movements and had soft restraints placed on limbs, understanding why they placed them helped him be calm for the room transfer. Family present offering care and comfort to patient's spouse. Spouse expressive of her emotions, the family stated a few times that being present for the code event was 'shocking' but that they did not regret being there as they were 'glad they could help' and 'see him get help so fast'.     Support System:  patient had 3 family members at bedside. 2 male family members providing support to patient's spouse.     Interventions/Plan: I provided spiritual presence, reflective listening, patient advocacy, liaised between medical team and patient family, and helped get family moved from [facility] over toward [facility] tower for ICU. Patient's family is aware how to reach back out to spiritual care team.     The On-Call Chaplain is available on Voalte or can be paged via the switchboard [phone]) for urgent and emergent needs. The Spiritual Care team responds to other requests within 24-hours when submitted as a Chaplain Consult in O2.         Date/Time:                      User:                                      08/25/2024 12:03 PM Madisen Ludvigsen      PCU 11

## 2024-08-26 ENCOUNTER — Inpatient Hospital Stay: Admit: 2024-08-26 | Discharge: 2024-08-26 | Payer: BLUE CROSS/BLUE SHIELD

## 2024-08-26 ENCOUNTER — Encounter: Admit: 2024-08-26 | Discharge: 2024-08-26 | Payer: BLUE CROSS/BLUE SHIELD

## 2024-08-26 LAB — LACTIC ACID (BG - RAPID LACTATE): ~~LOC~~ BKR LACTIC ACID(SYRINGE): 1.1 mmol/L (ref 0.5–2.0)

## 2024-08-26 LAB — POC GLUCOSE
~~LOC~~ BKR POC GLUCOSE: 236 mg/dL — ABNORMAL HIGH (ref 70–100)
~~LOC~~ BKR POC GLUCOSE: 145 mg/dL — ABNORMAL HIGH (ref 70–100)
~~LOC~~ BKR POC GLUCOSE: 166 mg/dL — ABNORMAL HIGH (ref 70–100)
~~LOC~~ BKR POC GLUCOSE: 198 mg/dL — ABNORMAL HIGH (ref 70–100)

## 2024-08-26 LAB — HIGH SENSITIVITY TROPONIN I 2 HOUR
~~LOC~~ BKR HIGH SENSITIVITY TROPONIN I 2 HOUR: 653 ng/L — ABNORMAL HIGH (ref <20.0)
~~LOC~~ BKR HIGH SENSITIVITY TROPONIN I DELTA VALUE: 515

## 2024-08-26 LAB — BLOOD GASES, ARTERIAL
~~LOC~~ BKR BASE DEFICIT-ART: 8.9 mmol/L
~~LOC~~ BKR BASE DEFICIT-ART: 6.9 mmol/L
~~LOC~~ BKR BASE DEFICIT-ART: 6.5 mmol/L
~~LOC~~ BKR BICARB, ART(CAL): 17 mmol/L — ABNORMAL LOW (ref 21.0–28.0)
~~LOC~~ BKR BICARB, ART(CAL): 18 mmol/L — ABNORMAL LOW (ref 21.0–28.0)
~~LOC~~ BKR BICARB, ART(CAL): 19 mmol/L — ABNORMAL LOW (ref 21.0–28.0)
~~LOC~~ BKR O2 SAT-ART: 96 % (ref 95.0–99.0)
~~LOC~~ BKR O2 SAT-ART: 93 % — ABNORMAL LOW (ref 95.0–99.0)
~~LOC~~ BKR O2 SAT-ART: 95 % (ref 95.0–99.0)
~~LOC~~ BKR PCO2-ART: 47 mmHg — ABNORMAL HIGH (ref 35–45)
~~LOC~~ BKR PCO2-ART: 43 mmHg (ref 35–45)
~~LOC~~ BKR PH-ART: 7.2 — ABNORMAL LOW (ref 7.35–7.45)
~~LOC~~ BKR PH-ART: 7.2 — ABNORMAL LOW (ref 7.35–7.45)
~~LOC~~ BKR PH-ART: 7.2 — ABNORMAL LOW (ref 7.35–7.45)
~~LOC~~ BKR PO2-ART: 97 mmHg (ref 80–100)
~~LOC~~ BKR PO2-ART: 78 mmHg — ABNORMAL LOW (ref 80–100)
~~LOC~~ BKR PO2-ART: 89 mmHg (ref 80–100)

## 2024-08-26 LAB — COMPREHENSIVE METABOLIC PANEL
~~LOC~~ BKR ALT: 64 U/L — ABNORMAL HIGH (ref 7–56)
~~LOC~~ BKR ANION GAP: 13 10*3/uL — ABNORMAL HIGH (ref 3–12)
~~LOC~~ BKR AST: 79 U/L — ABNORMAL HIGH (ref 7–40)
~~LOC~~ BKR CO2: 15 mmol/L — ABNORMAL LOW (ref 21–30)
~~LOC~~ BKR GLOMERULAR FILTRATION RATE (GFR): 21 mL/min — ABNORMAL LOW (ref >60–11.00)

## 2024-08-26 LAB — HIGH SENSITIVITY TROPONIN I 4 HR
~~LOC~~ BKR HI SEN TNI DELTA 4-2: -93
~~LOC~~ BKR HIGH SENSITIVITY TROPONIN I 4 HOUR: 560 ng/L — ABNORMAL HIGH (ref <20.0)

## 2024-08-26 LAB — PTT (APTT)
~~LOC~~ BKR PTT: 27 s (ref 24.0–36.5)
~~LOC~~ BKR PTT: >20 s
~~LOC~~ BKR PTT: >20 s (ref 35–45)
~~LOC~~ BKR PTT: 182 s — ABNORMAL HIGH (ref 24.0–36.5)
~~LOC~~ BKR PTT: 62 s — ABNORMAL HIGH (ref 24.0–36.5)

## 2024-08-26 LAB — LACTIC ACID(LACTATE): ~~LOC~~ BKR LACTIC ACID: 1.3 mmol/L (ref 0.5–2.0)

## 2024-08-26 MED ORDER — HEPARIN (PORCINE) BOLUS FOR CONTINUOUS INFUSION (BAG) - XA STD
20-40 [IU]/kg | INTRAVENOUS | 0 refills | Status: DC | PRN
Start: 2024-08-26 — End: 2024-08-30

## 2024-08-26 MED ORDER — ACETAMINOPHEN 1,000 MG/100 ML (10 MG/ML) IV SOLN
1000 mg | Freq: Once | INTRAVENOUS | 0 refills | Status: CP
Start: 2024-08-26 — End: ?
  Administered 2024-08-26: 16:00:00 1000 mg via INTRAVENOUS

## 2024-08-26 MED ORDER — SODIUM CHLORIDE 0.9 % TKO FLUID
INTRAVENOUS | 0 refills | Status: DC
Start: 2024-08-26 — End: 2024-08-26

## 2024-08-26 MED ORDER — INSULIN ASPART 100 UNIT/ML SC FLEXPEN
0-6 [IU] | SUBCUTANEOUS | 0 refills | Status: DC
Start: 2024-08-26 — End: 2024-08-31

## 2024-08-26 MED ORDER — HEPARIN (PORCINE) IN 5 % DEX 25,000 UNIT/250 ML(100 UNIT/ML) IV SOLP
0-3000 [IU]/h | INTRAVENOUS | 0 refills | Status: DC
Start: 2024-08-26 — End: 2024-08-30
  Administered 2024-08-26: 1600 [IU]/h via INTRAVENOUS
  Administered 2024-08-27 – 2024-08-29 (×3): 1500 [IU]/h via INTRAVENOUS
  Administered 2024-08-29: 20:00:00 1700 [IU]/h via INTRAVENOUS
  Administered 2024-08-30: 10:00:00 1800 [IU]/h via INTRAVENOUS

## 2024-08-26 MED ORDER — POLYETHYLENE GLYCOL 3350 17 GRAM PO PWPK
1 | Freq: Two times a day (BID) | GASTROSTOMY | 0 refills | Status: DC
Start: 2024-08-26 — End: 2024-08-29
  Administered 2024-08-27 – 2024-08-29 (×5): 17 g via GASTROSTOMY

## 2024-08-26 MED ORDER — PANTOPRAZOLE 40 MG IV SOLR
40 mg | Freq: Every day | INTRAVENOUS | 0 refills | Status: DC
Start: 2024-08-26 — End: 2024-09-02
  Administered 2024-08-26 – 2024-09-01 (×7): 40 mg via INTRAVENOUS

## 2024-08-26 MED ORDER — SENNOSIDES-DOCUSATE SODIUM 8.6-50 MG PO TAB
1 | Freq: Every day | OROGASTRIC | 0 refills | Status: DC
Start: 2024-08-26 — End: 2024-08-29
  Administered 2024-08-27 – 2024-08-29 (×3): 1 via OROGASTRIC

## 2024-08-26 MED ORDER — OXYCODONE 5 MG PO TAB
5 mg | ORAL | 0 refills | Status: DC | PRN
Start: 2024-08-26 — End: 2024-08-27

## 2024-08-26 MED ORDER — NOREPINEPHRINE BITARTRATE-D5W 16 MG/250 ML (64 MCG/ML) IV SOLN
0-.5 ug/kg/min | INTRAVENOUS | 0 refills | Status: DC
Start: 2024-08-26 — End: 2024-09-01
  Administered 2024-08-26: 15:00:00 0.5 ug/kg/min via INTRAVENOUS
  Administered 2024-08-26: 0.27 ug/kg/min via INTRAVENOUS
  Administered 2024-08-26: 18:00:00 0.32 ug/kg/min via INTRAVENOUS
  Administered 2024-08-27: 08:00:00 0.22 ug/kg/min via INTRAVENOUS
  Administered 2024-08-27: 17:00:00 0.17 ug/kg/min via INTRAVENOUS
  Administered 2024-08-28: 11:00:00 0.18 ug/kg/min via INTRAVENOUS
  Administered 2024-08-28: 02:00:00 0.25 ug/kg/min via INTRAVENOUS
  Administered 2024-08-28 – 2024-08-29 (×2): 0.13 ug/kg/min via INTRAVENOUS
  Administered 2024-08-29: 0.11 ug/kg/min via INTRAVENOUS
  Administered 2024-08-30: 09:00:00 0.22 ug/kg/min via INTRAVENOUS

## 2024-08-26 NOTE — Progress Notes [1]
 MEDICAL INTENSIVE CARE UNIT  PROGRESS NOTE       Patient's Name:  Matthew Paul MRN: [id number]   Patient's DOB: Feb 19, 1966   Today's Date:  08/26/2024  Admission Date: 08/24/2024  LOS: 1 day    Problem list  Principal Problem:    Acquired bilateral renal cysts  Active Problems:    ADPKD (autosomal dominant polycystic kidney disease)    Cardiac arrest (CMS-HCC)    Acute kidney injury superimposed on CKD    Obstructive cardiovascular shock (CMS-HCC)    Lactic acidosis    High anion gap metabolic acidosis    Acute pulmonary embolism with acute cor pulmonale (CMS-HCC)    Acute respiratory failure with hypoxia (CMS-HCC)      HOSPITAL COURSE SUMMARY     Matthew Paul is a 58 y.o. 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  Patient admitted initially on 11/14 for planned left robotic renal cyst decortication and cystoscopy with ureteral stent placement.  No complications during procedure and patient had existing JP drain placed.  Unfortunately, patient suffered PEA cardiac arrest on 11/15 with ROSC following 2 rounds of epinephrine .    Patient was transported to the ICU on 11/15 and initially required high vent settings [PEEP 10, FiO2 100%] and pressor support with NE and vaso.  Cardiology consulted for abnormal EKG with new RBBB.  Mild metabolic derangements on initial labs without electrolyte disturbances.  Formal TTE stat with EF 55%, grade 1 diastolic dysfunction, and moderate to severely dilated LV with PASP 34.  CTA chest 11/15 showing massive bilateral PE, right greater than left with significant RV strain.  CT AP without evidence of hemorrhage or acute abdominal pathology.  After much discussion with family regarding risk/benefit, started on therapeutic heparin  drip 11/15.     Interval update 08/26/2024:  > Continue ventilation and sedation with prop and fent, wean as able  > Continue heparin  gtt - consider monitoring with Xa and aPTT for a few values, then continue monitoring with Xa if values correlate  > Continue Levo and vaso gtt - maintain goal MAP >=65  > Plan to place OGT today and start trickle feeds - dietician consulted  > 11/16 Doppler BLE - acute appearing nonocclusive thrombus in L popliteal vein      ASSESSMENT & PLAN     NEURO/PSYCH  #Sedated   - Initially post-code: moving all extremities, opening eyes, attempting to pull at tube prior to sedation   PLAN:  > Sedation w/ prop & fent, wean as able  > No concern for mentation at this time as requiring high levels of sedation, defer CTH.       -----------------------------------------------------------------------------------------------------------------------------------------------------------------------------------------------------------------------  PULMONARY  #Mechanical intubation for airway protection   #Bilateral massive PE  - Intubated on 11/15 post PEA arrest. ETT advanced to 28 at the teeth   - Bedside POCUS concerning for RV overload and McConnell's sign.   - TTE: RV overload further c/f PE  - CTA Chest: multiple BL PE, R>L. Elevated RV:LV c/w right heart strain w/ dilation of RA, RV, main PA.  PLAN:  >Invasive Mode: V/AC+  Set VT (mL):  [550 milliliters]   Expired VT Spontaneous (mL):  [0 milliliters-163 milliliters]   Expiratory VT (mL):  [540 mL-703 mL]   Set RR:  [24 breaths/minutes-25 breaths/minutes]   Total Respiratory Rate:  [25 breaths/minutes-32 breaths/minutes]  Minute Volume (Lpm):  [13.6 liters/minutes-14.9 liters/minutes]   %MVspontaneous:  [0 %-6 %]   PIP Actual (cmH2O):  [21 cm H20-24 cm H20]   PEEP/CPAP (cmH2O):  [5 cm H2O]   Mean Airway Pressure (cmH2O):  [10 cm H2O-12 cm H2O]   Plateau Pressure (cmH2O):  [21 cm H2O]   > Repeat ABG w/ vent changes   > CTA Chest showing bilateral massive PE.  Had a long discussion with family, pharmacy, interventional radiology, and overall multidisciplinary approach and discussion for tPA versus heparin .  Earlier in the conversation, patient was becoming more hemodynamically unstable and borderline needing to add a third pressor.  Thankfully, we were able to come down on sedation which decreased his pressor requirements.  With this, decided to start him on therapeutic heparin  and discussed dosing with pharmacy.  Discussed risks/benefit with family at length especially given his recent surgical procedure yesterday and existing JP drain, along with potential for existing cerebral aneurysms that are often associated with autosomal dominant PKD.  They understand this and would like to continue forward with heparin  GGT.                 > Frequent neurovascular checks               > If becomes hemodynamically unstable or repeat cardiac arrest, will start tPA.  Pharmacy aware.  > Continue heparin  gtt - consider monitoring with Xa and aPTT for a few values, then continue monitoring with Xa if values correlate      -----------------------------------------------------------------------------------------------------------------------------------------------------------------------------------------------------------------------  CARDIOVASCULAR  #PEA arrest   #Shock - obstructive 2/2 PE   #Remote history of Afib (1980s) not on AC   - PEA arrest on 11/15, ROSC after 2 rounds of epi and total code lasting 4-6 minutes.  - PTA: ASA 81 mg daily  - EKG:               Previous OSH 2020: SR, Qtc 406               EKG (11/15 post code): Sinus tachy 100s, Qtc 452, new RBBB  - Pressors: NE, vaso   - Echo (11/15): EF 55%. No dia dysfunction. Mod to severely enlarged RV w/ intraventricular septal flattening. PASP 35 + CVP. No major valvular abnormalities.   - CTA Chest: massive bl PE with RV strain.   - CT AP: normal post-surgical changes.   - Trop: 138   PLAN:  > PEA arrest due to obstructive shock from massive bilateral PE.  He has a new right bundle branch block, likely due to this.  Discussed with interventional radiology, not amenable to thrombectomy at this time due to location.  Currently remains on norepi and vasopressin  for additional pressor support.                > Goal MAP > 65    #HLD   - PTA: atorvastatin  20 mg   - Lipid profile: chol 124, TG 208, HDL 33, LDL 82  PLAN:  > HOLD atorvastatin  given LFTs     #HTN  - PTA meds: HCTZ 25 mg qAM, lisinopril  20 mg daily  PLAN:  > HOLD HCTZ and lisinopril  - currently in multipressor shock     -----------------------------------------------------------------------------------------------------------------------------------------------------------------------------------------------------------------------  FEN/GI  #Elevated LFTs, likely post-code   #GERD  - Post code LFTs AST 157 (13 on admit), ALT 163 (12 on admit). Tbili 0.4.  - PTA meds: omeprazole 40 mg daily   PLAN:  > Remove scheduled  tylenol  and hold atorvastatin    > Daily CMP   > Plan to place OGT today and start trickle feeds - dietician consulted  > Continue PPI daily    -----------------------------------------------------------------------------------------------------------------------------------------------------------------------------------------------------------------------  RENAL  #ADPKD  #AKI on CKD   #HAGMA, mild likely 2/2 above further renal dysfunction  - PTA meds: jynarque  (valptan) 60 mg qAM / 30 mg qPM  - Baseline Cr 2.5 - 2.8   -11/14 underwent left robotic renal cyst decortication, cystoscopy, L ureteral stent placement for enlarging bilateral renal size causing pressure and discomfort in the setting of ADPKD.  Total of roughly 100 cysts were decorticated with 19 large cysts sent for pathology.  Placement of left ureteral stent, confirmation prior to surgical completion that left ureter had no urine leak.  No overt post surgical complications.  - CT AP (11/15): normal post-operative changes  Recent Labs     08/25/24  0336 08/25/24  1258 08/26/24  0313 CR 3.23* 3.69* 3.32*   BUN 47* 50* 49*   GFR 21* 18* 21*     - Net IO Since Admission: 3,827.89 mL [08/26/24 0638]  -   Intake/Output Summary (Last 24 hours) at 08/26/2024 9361  Last data filed at 08/26/2024 0600  Gross per 24 hour   Intake 3896.22 ml   Output 1590 ml   Net 2306.22 ml   PLAN:  > JP drain in place with ~ 430 mL out in the last 24 hours, per urology suspected/usual amount of fluid drainage.  > Replace Foley catheter  > Worsening AKI: Strict I's and O's, avoid further nephrotoxic agents, renally dose medications as able.      -----------------------------------------------------------------------------------------------------------------------------------------------------------------------------------------------------------------------  ENDOCRINE  #No acute concerns   - Invalid input(s): GLUCOSE  Recent Labs     08/25/24  1131 08/25/24  1357 08/25/24  2101   GLUPOC 110* 183* 236*   PLAN:  > CTM  > LDCF      -----------------------------------------------------------------------------------------------------------------------------------------------------------------------------------------------------------------------  HEME/ONC  #Leukocytosis, likely reactive (post-surgical vs cardiac arrest)    Recent Labs     08/25/24  0336 08/25/24  1258 08/26/24  0313   HGB 13.2* 14.0 14.3   MCV 95.4 95.4 95.6   PLTCT 176 204 175   PLAN:  > Monitor HGB, Transfuse if Hgb <7 and PLT < 10   > DVT Ppx: Has been on heparin  prior to procedure on initial admission   > No evidence of acute bleeding, especially with uptrending hgb. Ongoing monitor for s/s of bleeding.   > If significant HD instability: obtain TEG, CBC STAT and if dropping hgb consider fast scan for eval of retroperitoneal bleed/hematoma. At this time, no concerns for such.   > 11/16 Doppler BLE - acute appearing nonocclusive thrombus in L popliteal vein  > Continue heparin  gtt - consider monitoring with Xa and aPTT for a few values, then continue monitoring with Xa if values correlate    -----------------------------------------------------------------------------------------------------------------------------------------------------------------------------------------------------------------------  ID  #No acute infectious concerns   Recent Labs     08/25/24  0336 08/25/24  1258 08/26/24  0313   WBC 9.20 17.70* 14.10*     - Temp (24hrs), Avg:36.6 ?C (97.9 ?F), Paul:36.4 ?C (97.6 ?F), Max:36.8 ?C (98.3 ?F)      Culture Data Date collected Result   Blood cx            Antimicrobial First dose Last dose Comment   Ancef  11/14  X2, for perioperative abx           PLAN:  >  No acute infectious concerns     -----------------------------------------------------------------------------------------------------------------------------------------------------------------------------------------------------------------------  MSK/DERM  #Gout   PLAN:  > Hold PTA allopurinol , colchicine  0.6 mg prn for flares     -----------------------------------------------------------------------------------------------------------------------------------------------------------------------------------------------------------------------  LDA/PROPHYLAXIS  Patient Lines/Drains/Airways Status       Active Lines:       Name Placement date Placement time Site Days    CVC Triple Femoral, right 08/25/24  1326  -- 1    Arterial Line Radial, right 08/25/24  1850  -- 1    Surgical Drain Bulb (e.g. JP) Left Abdomen #1 08/24/24  1733  -- 2    Indwelling Urinary Catheter 16 FR 3-way 08/25/24  1230  -- 1    Peripheral IV 08/24/24 1101 Left Distal;Posterior Forearm 20 G 08/24/24  1101  -- 2    Peripheral IV 08/24/24 1334 Right Forearm 18 G 08/24/24  1334  -- 2                    Lines: PIV x2, CVC R femoral (11/15 -), R radial A line (11/15 -)   Tubes: JP drain L abd (11/14 -), ETT (11/15 -)  Urinary Catheter:  Yes (11/15 -)  VTE ppx: SCDs, heparin  gtt  GI:  PPI  Bowel regimen: PRNs provided  Diet: Regular Diet  DIET NPO Strict  Code status: Full Code     Patient case discussed with attending physician, Dr. Alan LITTIE Constable, MD     Franky Rockers, MD  PGY-1 Anesthesiology    SUBJECTIVE     NAEON. Patient remains intubated, sedateed, on mechanical ventilation. He was moving and responding to verbal stimuli when off sedation.    Past Medical History:    Acid reflux    ADPKD (autosomal dominant polycystic kidney)    Apnea    Gout    Hyperlipidemia    Hypertension    Mild shortness of breath    Paroxysmal A-fib (CMS-HCC)     Surgical History:   Procedure Laterality Date    HX HERNIA REPAIR Right 1983    ROBOTIC LAPAROSCOPIC ABLATION RENAL CYSTS Right 06/08/2019    Performed by Erskin Lenis, MD at [facility] OR    ROBOT ASSISTED SURGERY N/A 06/08/2019    Performed by Erskin Lenis, MD at [facility] OR    CYSTOURETHROSCOPY WITH INDWELLING URETERAL STENT INSERTION Right 06/08/2019    Performed by Erskin Lenis, MD at [facility] OR    ROBOTIC ASSISTED LAPAROSCOPIC RENAL CYST DECORTICATION Left 10/02/2019    Performed by Erskin Lenis, MD at [facility] OR    CYSTOURETHROSCOPY WITH INDWELLING URETERAL STENT INSERTION Left 10/02/2019    Performed by Erskin Lenis, MD at [facility] OR    ABLATION, CYST, KIDNEY, LAPAROSCOPIC Left 08/24/2024    Performed by Erskin Lenis LABOR, MD at [facility] OR    ROBOT ASSISTED SURGERY Left 08/24/2024    Performed by Erskin Lenis LABOR, MD at [facility] OR    CYSTOURETHROSCOPY, WITH INDWELLING URETERAL STENT INSERTION Left 08/24/2024    Performed by Erskin Lenis LABOR, MD at [facility] OR    HX TONSILLECTOMY      KIDNEY CYST REMOVAL      Multiple    KNEE CARTILAGE SURGERY Bilateral      No family history on file.  Social History[1]  Vaping/E-liquid Use    Vaping Use Never User      Vaping/E-liquid Substances    CBD No     Nicotine No     Other No     Flavored No  THC No     Unknown No             Current Medications[2]           OBJECTIVE                          Vital Signs: Last Filed                 Vital Signs: 24 Hour Range   BP: 108/77 (11/15 1700)  Temp: 36.6 ?C (97.9 ?F) (11/16 0400)  Pulse: 90 (11/16 0600)  Respirations: 25 PER MINUTE (11/16 0600)  SpO2: 94 % (11/16 0600)  O2%: 60 % (11/16 0600)  O2 Device: Ventilator (11/16 0411)  Height: 200.7 cm (6' 7) (11/15 1509) BP: (63-138)/(47-104)   ABP: (88-128)/(58-72)   Temp:  [36.4 ?C (97.6 ?F)-36.8 ?C (98.3 ?F)]   Pulse:  [76-188]   Respirations:  [15 PER MINUTE-26 PER MINUTE]   SpO2:  [84 %-99 %]   O2%:  [50 %-100 %]   O2 Device: Ventilator   Intensity Pain Scale (Self Report): 2 (08/25/24 2000) Vitals:    08/24/24 2115 08/25/24 1227 08/25/24 1509   Weight: 134.3 kg (296 lb) (!) 144.5 kg (318 lb 9 oz) (!) 144.2 kg (318 lb)         Artificial Airway   Endotracheal Tube       Ventilator/Respiratory Therapy  Yes: Invasive Mode: V/AC+  Set VT (mL):  [550 milliliters]   Expired VT Spontaneous (mL):  [0 milliliters-163 milliliters]   Expiratory VT (mL):  [540 mL-703 mL]   Set RR:  [24 breaths/minutes-25 breaths/minutes]   Total Respiratory Rate:  [25 breaths/minutes-32 breaths/minutes]   Minute Volume (Lpm):  [13.6 liters/minutes-14.9 liters/minutes]   %MVspontaneous:  [0 %-6 %]   PIP Actual (cmH2O):  [21 cm H20-24 cm H20]   PEEP/CPAP (cmH2O):  [5 cm H2O]   Mean Airway Pressure (cmH2O):  [10 cm H2O-12 cm H2O]   Plateau Pressure (cmH2O):  [21 cm H2O]      Vent Weaning   Per protocol    Physical Exam  Constitutional: 58 y.o. male intubated and sedated  Head: Normocephalic, atraumatic  Cardiovascular: Regular rhythm, tachycardic rate  Pulmonary: Clear to auscultation bilaterally, no wheezes or rales  GI: Abdomen soft, non-tender, non-distended (baseline habitus)  Skin: No rashes or bruises, good turgor, cap refill <2s  Neuro: Opening eyes, moving all extremities spontaneously, and trying to pull at ET tube prior to increase of sedation.  Withdraws to pain.  Musculoskeletal: No deformities  Lymphatic/extremities: No pitting edema, pulses present b/l    Laboratory:  Recent Labs     08/25/24  0336 08/25/24  1258 08/26/24  0313   NA 141 137 130*   K 5.3* 4.9 4.9   CL 109 105 102   CO2 23 17* 15*   GAP 9 15* 13*   BUN 47* 50* 49*   CR 3.23* 3.69* 3.32*   GLU 133* 207* 219*   CA 8.8 9.9 9.5   ALBUMIN   --  3.8 3.5   MG  --  2.0 2.2       Recent Labs     08/25/24  0336 08/25/24  1258 08/25/24  1825 08/26/24  0028 08/26/24  0149 08/26/24  0313   WBC 9.20 17.70*  --   --   --  14.10*   HGB 13.2* 14.0  --   --   --  14.3  HCT 40.4 43.3  --   --   --  43.0   PLTCT 176 204  --   --   --  175   PTT  --   --  27.2 >200.0 >200.0  --    AST  --  157*  --   --   --  79*   ALT  --  163*  --   --   --  64*   ALKPHOS  --  67  --   --   --  62      Estimated Creatinine Clearance: 38.6 mL/Paul (A) (by C-G formula based on SCr of 3.32 mg/dL (H)).  Vitals:    08/24/24 2115 08/25/24 1227 08/25/24 1509   Weight: 134.3 kg (296 lb) (!) 144.5 kg (318 lb 9 oz) (!) 144.2 kg (318 lb)      Recent Labs     08/26/24  0028 08/26/24  0313   PHART 7.26* 7.25*   PO2ART 78* 102*         Pertinent radiology reviewed.    CTA CHEST PULM EMBOLISM W/CONT   Final Result         CTA CHEST:      1. Multiple bilateral pulmonary emboli as described above, right greater than left. Elevated RV: LV ratio consistent with right heart strain, with dilatation of the right atrium, right ventricle and main pulmonary artery. Borderline cardiomegaly.   2. Moderate left lower lobe with additional areas of mild bilateral atelectasis, without significant pulmonary infarct or pleural effusion.      CT abdomen and pelvis:      1. Postsurgical changes of reported recent left robotic renal cyst decortication, with left perinephric stranding and multifocal gas. Small amount of free air is noted and likely postoperative. Surgical drain extending to the left lateral perinephric region, with no abdominal or pelvic fluid collection.   2. Redemonstration of marked bilateral renal enlargement with innumerable cysts, consistent with autosomal dominant polycystic kidney disease. 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   3. Bladder is decompressed about a Foley catheter, limiting evaluation.   4. Colonic diverticulosis without acute diverticulitis or bowel obstruction.   5. Trace pelvic free fluid, nonspecific though possibly postoperative.      Major preliminary findings including presence of extensive pulmonary emboli with right heart strain was conveyed to Dr. Iannazzo by Dairl messenger at 4:34 PM on 08/25/2024.          Finalized by Norleen Pane, M.D. on 08/25/2024 4:35 PM. Dictated by Norleen Pane, M.D. on 08/25/2024 4:12 PM.      CT ABD/PELV W CONTRAST   Final Result         CTA CHEST:      1. Multiple bilateral pulmonary emboli as described above, right greater than left. Elevated RV: LV ratio consistent with right heart strain, with dilatation of the right atrium, right ventricle and main pulmonary artery. Borderline cardiomegaly.   2. Moderate left lower lobe with additional areas of mild bilateral atelectasis, without significant pulmonary infarct or pleural effusion.      CT abdomen and pelvis:      1. Postsurgical changes of reported recent left robotic renal cyst decortication, with left perinephric stranding and multifocal gas. Small amount of free air is noted and likely postoperative. Surgical drain extending to the left lateral perinephric region, with no abdominal or pelvic fluid collection.   2. Redemonstration of marked bilateral renal enlargement with innumerable  cysts, consistent with autosomal dominant polycystic kidney disease. 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   3. Bladder is decompressed about a Foley catheter, limiting evaluation.   4. Colonic diverticulosis without acute diverticulitis or bowel obstruction.   5. Trace pelvic free fluid, nonspecific though possibly postoperative.      Major preliminary findings including presence of extensive pulmonary emboli with right heart strain was conveyed to Dr. Iannazzo by Dairl messenger at 4:34 PM on 08/25/2024.          Finalized by Norleen Pane, M.D. on 08/25/2024 4:35 PM. Dictated by Norleen Pane, M.D. on 08/25/2024 4:12 PM.      2D + DOPPLER ECHO   Final Result      CHEST SINGLE VIEW   Final Result   FINDINGS/IMPRESSION:        Support Devices: Endotracheal tube has been slightly advanced, with the tip now projecting 3 cm above the carina. This could be slightly advanced for optimal positioning.      Lungs/Pleura: Inferior aspects of both lungs, particularly the left, are excluded from the field-of-view,. Patchy bibasilar opacities, likely atelectasis. No pneumothorax.      Heart and Mediastinum: The cardiomediastinal silhouette is stable.             Finalized by JOSETTE BEAGLE, M.D. on 08/25/2024 12:48 PM. Dictated by JOSETTE BEAGLE, M.D. on 08/25/2024 12:46 PM.      CHEST SINGLE VIEW   Final Result   FINDINGS/IMPRESSION:        Support Devices: Endotracheal tube in place, with the tip projecting 0.5 cm above the carina. Advancement is recommended..      Lungs/Pleura: Inferior aspect of the left lung, and inferior right costophrenic angle are excluded from the film.SABRA Patchy bibasilar opacities, likely atelectasis. Mild pulmonary edema.. No pneumothorax.SABRA      Heart and Mediastinum: Cardiac silhouette is incompletely visualized, although likely within normal limits.          Finalized by JOSETTE BEAGLE, M.D. on 08/25/2024 12:49 PM. Dictated by JOSETTE BEAGLE, M.D. on 08/25/2024 12:48 PM.           Scheduled Meds:[Held by Provider] allopurinoL  (ZYLOPRIM ) tablet 100 mg, 100 mg, Oral, QDAY  [Held by Provider] atorvastatin  (LIPITOR) tablet 10 mg, 10 mg, Oral, QDAY  bacitracin  zinc  topical ointment, , Topical, BID  [Held by Provider] hydroCHLOROthiazide  (HYDRODIURIL ) tablet 25 mg, 25 mg, Oral, QAM8  insulin  aspart (U-100) (NOVOLOG  FLEXPEN U-100 INSULIN ) injection PEN 0-6 Units, 0-6 Units, Subcutaneous, ACHS (22)  pantoprazole  DR (PROTONIX ) tablet 40 mg, 40 mg, Oral, QDAY  petrolatum  (STYE) ophthalmic ointment 0.25 inch, 0.25 inch, Both Eyes, Q6H  polyethylene glycol 3350  (MIRALAX ) packet 17 g, 1 packet, Oral, BID  sennosides-docusate sodium  (SENOKOT-S) tablet 1 tablet, 1 tablet, Oral, BID    Continuous Infusions:   fentaNYL  (SUBLIMAZE ) 1000 mcg/100 mL NS IV drip (std conc)(premade) 30 mcg/hr (08/25/24 1740)    heparin  (porcine) 25,000 units in dextrose  5% (D5W) 250 mL IV infusion (std conc) 1,800 Units/hr (08/26/24 0332)    norepinephrine  (LEVOPHED ) 4 mg in dextrose  5% (D5W) 250 mL IV drip (std conc) 0.3 mcg/kg/Paul (08/26/24 0456)    propofoL  (DIPRIVAN ) 10 mg/mL IV drip 30 mcg/kg/Paul (08/26/24 0229)    sodium chloride  0.9% TKO infusion      vasopressin  (VASOSTRICT ) 20 units in sodium chloride  0.9% (NS) 100 mL IV infusion (std conc)(premade) 1.8 Units/hr (08/26/24 0629)     PRN and Respiratory Meds:acetaminophen  Q6H PRN, dextrose  50% PRN,  fentaNYL  TITRATE **AND** fentaNYL  Q30 Paul PRN, heparin  (porcine) TITRATE **AND** heparin  (porcine) Q6H PRN, ondansetron  Q6H PRN **OR** ondansetron  Q6H PRN      Malnutrition Details:                                        Active Wounds                                      [1]   Social History  Socioeconomic History    Marital status: Divorced   Tobacco Use    Smoking status: Never    Smokeless tobacco: Former     Types: Chew     Quit date: 1990    Tobacco comments:     Chewed x10 years   Vaping Use    Vaping status: Never Used   Substance and Sexual Activity    Alcohol use: Yes     Alcohol/week: 2.0 standard drinks of alcohol     Types: 2 Cans of beer per week     Comment: Occasional    Drug use: Never   [2]   Current Facility-Administered Medications:     acetaminophen  (TYLENOL ) tablet 650 mg, 650 mg, Oral, Q6H PRN, Jobe, Alan CROME, MD    [Held by Provider] allopurinoL  (ZYLOPRIM ) tablet 100 mg, 100 mg, Oral, QDAY, Marty Toribio SAUNDERS, MD, 100 mg at 08/25/24 9062    [Held by Provider] atorvastatin  (LIPITOR) tablet 10 mg, 10 mg, Oral, QDAY, Marty Toribio SAUNDERS, MD, 10 mg at 08/25/24 9062    bacitracin  zinc  topical ointment, , Topical, BID, Marty Toribio SAUNDERS, MD, Given at 08/25/24 2253    dextrose  50% (D50) syringe 25-50 mL, 12.5-25 g, Intravenous, PRN, Dodd, Danica, MD    fentaNYL  (SUBLIMAZE ) 1000 mcg/100 mL NS IV drip (std conc)(premade), 10-70 mcg/hr, Intravenous, TITRATE, Last Rate: 3 mL/hr at 08/25/24 1740, 30 mcg/hr at 08/25/24 1740 **AND** fentaNYL  (SUBLIMAZE ) BOLUS for continuous infusion, 25 mcg, Intravenous, Q30 Paul PRN, Jackquline Castleman, MD    heparin  (porcine) 25,000 units in dextrose  5% (D5W) 250 mL IV infusion (std conc), 0-3,000 Units/hr, Intravenous, TITRATE, Last Rate: 18 mL/hr at 08/26/24 0332, 1,800 Units/hr at 08/26/24 0332 **AND** heparin  (porcine) BOLUS for continuous infusion (bag) 2,884-5,768 Units, 20-40 Units/kg, Intravenous, Q6H PRN, Iannazzo, Emily G, DO    [Held by Provider] hydroCHLOROthiazide  (HYDRODIURIL ) tablet 25 mg, 25 mg, Oral, QAM8, Marty Toribio SAUNDERS, MD    insulin  aspart (U-100) (NOVOLOG  FLEXPEN U-100 INSULIN ) injection PEN 0-6 Units, 0-6 Units, Subcutaneous, ACHS (22), Dodd, Danica, MD, 1 Units at 08/26/24 9365    norepinephrine  (LEVOPHED ) 4 mg in dextrose  5% (D5W) 250 mL IV drip (std conc), 0-0.5 mcg/kg/Paul, Intravenous, TITRATE, Jackquline Castleman, MD, Last Rate: 151.1 mL/hr at 08/26/24 0456, 0.3 mcg/kg/Paul at 08/26/24 0456    ondansetron  (ZOFRAN  ODT) rapid dissolve tablet 4 mg, 4 mg, Oral, Q6H PRN **OR** ondansetron  HCL (PF) (ZOFRAN  (PF)) injection 4 mg, 4 mg, Intravenous, Q6H PRN, Marty Toribio SAUNDERS, MD    pantoprazole  DR (PROTONIX ) tablet 40 mg, 40 mg, Oral, QDAY, Marty Toribio SAUNDERS, MD, 40 mg at 08/25/24 9061    petrolatum  (STYE) ophthalmic ointment 0.25 inch, 0.25 inch, Both Eyes, Q6H, Jobe, Amanda L, MD, 0.25 inch at 08/26/24 0630    polyethylene glycol 3350  (MIRALAX ) packet 17 g, 1 packet, Oral, BID, Marty Toribio  R, MD, 17 g at 08/25/24 9062    propofoL  (DIPRIVAN ) 10 mg/mL IV drip, 5-70 mcg/kg/Paul, Intravenous, TITRATE, Jackquline Castleman, MD, Last Rate: 24.2 mL/hr at 08/26/24 0229, 30 mcg/kg/Paul at 08/26/24 0229    sennosides-docusate sodium  (SENOKOT-S) tablet 1 tablet, 1 tablet, Oral, BID, Marty Toribio SAUNDERS, MD, 1 tablet at 08/25/24 9062    sodium chloride  0.9% TKO infusion, , Intravenous, Continuous, Jobe, Alan CROME, MD    vasopressin  (VASOSTRICT ) 20 units in sodium chloride  0.9% (NS) 100 mL IV infusion (std conc)(premade), 1.8 Units/hr, Intravenous, Continuous, Jackquline Castleman, MD, Last Rate: 9 mL/hr at 08/26/24 0629, 1.8 Units/hr at 08/26/24 [phone]

## 2024-08-26 NOTE — Progress Notes [1]
 Urology Progress Note 08/26/2024     Assessment/Plan:    Matthew Paul is a 58 y.o. Male with ADPKD with enlarging bilateral renal size causing pressure and s/p LEFT robotic renal cyst decortication (11/14). Postoperative course complicated by pulmonary embolism resulting in cardiac arrest (11/15) with subsequent ROSC.       Will discuss plan with staff surgeon - Dr. Marye for Dr. Erskin    - MICU primary; greatly appreciate assistance in management   - Pain: Recommend MMPC with APAP, oxycodone  5 mg q4h  - Ok for anticoagulation including tPA if necessary   - Renal: Cr 3.32 (3.7)  - GU: Maintain Foley catheter, maintain JP  - Heme/ID: Hgb stable 14.3, WBC 14.3 (likely reactive)   > Heparin  gtt per MICU   > Ok for tPA from surgical perspective if necessary   - Urology to follow. Please page with questions or concerns.       Disposition: Continue inpatient care.     Debby JULIANNA Room, MD  Please page Urology on-call with questions.  ________________________________________________________________________   SUBJECTIVE:     Intubated, sedated.    OBJECTIVE:                   Vital Signs: Most Recent                Vital Signs: Past 24 Hours   BP: 108/77 (11/15 1700)  ABP: 108/63 (11/16 0637)  Temp: 36.6 ?C (97.9 ?F) (11/16 0400)  Pulse: 94 (11/16 0637)  Respirations: 25 PER MINUTE (11/16 0637)  SpO2: 93 % (11/16 0637)  O2%: 60 % (11/16 0637)  O2 Device: Ventilator (11/16 0411)  Height: 200.7 cm (6' 7) (11/15 1509)  Weight: 144.2 kg (318 lb) (11/15 1509)  BP: (63-138)/(47-104)   ABP: (88-128)/(58-72)   Temp:  [36.4 ?C (97.6 ?F)-36.8 ?C (98.3 ?F)]   Pulse:  [76-188]   Respirations:  [15 PER MINUTE-26 PER MINUTE]   SpO2:  [84 %-99 %]   O2%:  [50 %-100 %]   O2 Device: Ventilator       General: Intubated, sedated  Pulm: Intubated  CV: Regular rate  Abd: Soft, non-tender, non-distended; no rebound or guarding. JP ssang  Extremities: No edema; SCD's in place.  GU: Foley catheter in place draining light pink    Date 08/25/24 0701 - 08/26/24 0700 08/26/24 0701 - 08/27/24 0700   Shift 0701-1900 1901-0700 24 Hour Total 0701-1900 1901-0700 24 Hour Total   INTAKE   P.O. 100  100      I.V.(mL/kg/hr) 8311.4(8) 2107.7(1.2) 3796.2(1.1)      Shift Total(mL/kg) 8211.5(12.4) 2107.7(14.6) 6103.7(72)      OUTPUT   Urine(mL/kg/hr) 870(0.5) 350(0.2) 1220(0.4)        Urine Output (mL) ([REMOVED] Indwelling Urinary Catheter 16 FR Standard 2-way) 400  400        Urine Output (mL) (Indwelling Urinary Catheter 16 FR 3-way) 470 350 820      Drains 160 210 370        Drain Output (mL) (Surgical Drain Bulb (e.g. JP) Left Abdomen #1) 160 210 370      Other           Stool Occurrence 0 x  0 x        Urine Occurrence  0 x  0 x      Shift Total(mL/kg) 1030(7.1) 560(3.9) 1590(11)      NET 758.5 1547.7 2306.2      Weight (kg) 144.2 144.2  144.2 144.2 144.2 144.2       Labs:                     Hematology                               Chemistry   Recent Labs     08/26/24  0149 08/26/24  0313   WBC  --  14.10*   HGB  --  14.3   PLTCT  --  175   PTT >200.0  --     Recent Labs     08/26/24  0313   NA 130*   K 4.9   CL 102   CO2 15*   BUN 49*   CR 3.32*   GFR 21*   GLU 219*   CA 9.5        Malnutrition Details:                                        ACTIVE PROBLEMS:  Principal Problem:    Acquired bilateral renal cysts  Active Problems:    ADPKD (autosomal dominant polycystic kidney disease)    Cardiac arrest (CMS-HCC)    Acute kidney injury superimposed on CKD    Obstructive cardiovascular shock (CMS-HCC)    Lactic acidosis    High anion gap metabolic acidosis    Acute pulmonary embolism with acute cor pulmonale (CMS-HCC)    Acute respiratory failure with hypoxia (CMS-HCC)

## 2024-08-27 ENCOUNTER — Inpatient Hospital Stay: Admit: 2024-08-27 | Discharge: 2024-08-27 | Payer: BLUE CROSS/BLUE SHIELD

## 2024-08-27 ENCOUNTER — Encounter: Admit: 2024-08-27 | Discharge: 2024-08-27 | Payer: BLUE CROSS/BLUE SHIELD

## 2024-08-27 LAB — POC BLOOD GAS ARTERIAL
~~LOC~~ BKR POC CO2, ART: 40 mmHg (ref 35–45)
~~LOC~~ BKR POC O2, ART: 58 mmHg — ABNORMAL LOW (ref 80–100)
~~LOC~~ BKR POC PH, ART: 7.3 — ABNORMAL LOW (ref 7.35–7.45)

## 2024-08-27 LAB — PTT (APTT): ~~LOC~~ BKR PTT: 99 s — ABNORMAL HIGH (ref 24.0–36.5)

## 2024-08-27 LAB — POC GLUCOSE
~~LOC~~ BKR POC GLUCOSE: 120 mg/dL — ABNORMAL HIGH (ref 70–100)
~~LOC~~ BKR POC GLUCOSE: 134 mg/dL — ABNORMAL HIGH (ref 70–100)
~~LOC~~ BKR POC GLUCOSE: 148 mg/dL — ABNORMAL HIGH (ref 70–100)
~~LOC~~ BKR POC GLUCOSE: 149 mg/dL — ABNORMAL HIGH (ref 70–100)

## 2024-08-27 LAB — ECG 12-LEAD
P AXIS: 35 degrees
P-R INTERVAL: 150 ms
Q-T INTERVAL: 244 ms
Q-T INTERVAL: 326 ms
Q-T INTERVAL: 342 ms
QRS DURATION: 158 ms
QRS DURATION: 76 ms
QTC CALCULATION (BAZETT): 411 ms
QTC CALCULATION (BAZETT): 430 ms
QTC CALCULATION (BAZETT): 452 ms
R AXIS: -5 degrees
R AXIS: -16 degrees
R AXIS: -17 degrees
T AXIS: -15 degrees
T AXIS: 27 degrees
T AXIS: 50 degrees
VENTRICULAR RATE: 171 {beats}/min
VENTRICULAR RATE: 105 {beats}/min
VENTRICULAR RATE: 105 {beats}/min

## 2024-08-27 LAB — BLOOD GASES, ARTERIAL
~~LOC~~ BKR BASE DEFICIT-ART: 5.6 mmol/L
~~LOC~~ BKR BICARB, ART(CAL): 19 mmol/L — ABNORMAL LOW (ref 21.0–28.0)
~~LOC~~ BKR O2 SAT-ART: 90 % — ABNORMAL LOW (ref 95.0–99.0)
~~LOC~~ BKR PCO2-ART: 43 mmHg (ref 35–45)
~~LOC~~ BKR PH-ART: 7.3 — ABNORMAL LOW (ref 7.35–7.45)
~~LOC~~ BKR PO2-ART: 63 mmHg — ABNORMAL LOW (ref 80–100)

## 2024-08-27 LAB — HEPARIN ASSAY (UNFRACTIONATED)
~~LOC~~ BKR HEPARIN ASSAY: 0.7 [IU]/mL — ABNORMAL HIGH (ref 0.30–0.70)
~~LOC~~ BKR HEPARIN ASSAY: 0.5 [IU]/mL (ref 0.30–0.70)
~~LOC~~ BKR HEPARIN ASSAY: 0.3 [IU]/mL (ref 0.30–0.70)

## 2024-08-27 LAB — POC POTASSIUM: ~~LOC~~ BKR POC POTASSIUM: 4.6 mmol/L (ref 3.5–5.1)

## 2024-08-27 LAB — COMPREHENSIVE METABOLIC PANEL: ~~LOC~~ BKR GLOMERULAR FILTRATION RATE (GFR): 21 mL/min — ABNORMAL LOW (ref >60–11.0)

## 2024-08-27 LAB — POC HEMATOCRIT&HEMOGLOBIN
~~LOC~~ BKR POC HEMATOCRIT: 41 % — ABNORMAL LOW (ref 40–50)
~~LOC~~ BKR POC HEMOGLOBIN: 13 g/dL (ref 13.5–16.5)

## 2024-08-27 LAB — CBC: ~~LOC~~ BKR MPV: 8.8 fL (ref 7.0–11.0)

## 2024-08-27 LAB — MRSA BY PCR (NASAL): ~~LOC~~ BKR MRSA PNEUMONIA SCREEN: DETECTED — AB

## 2024-08-27 LAB — POC SODIUM: ~~LOC~~ BKR POC SODIUM: 130 mmol/L — ABNORMAL LOW (ref 137–147)

## 2024-08-27 LAB — POC LACTATE: ~~LOC~~ BKR POC LACTIC ACID: 0.8 mmol/L (ref 0.5–2.0)

## 2024-08-27 MED ORDER — NUTRITIONAL SUPPLEMENT (ENAR)
Freq: Three times a day (TID) | 0 refills | Status: DC
Start: 2024-08-27 — End: 2024-08-31

## 2024-08-27 MED ORDER — VANCOMYCIN 2,500 MG IVPB
2500 mg | Freq: Once | INTRAVENOUS | 0 refills | Status: CP
Start: 2024-08-27 — End: ?
  Administered 2024-08-27 (×3): 2500 mg via INTRAVENOUS

## 2024-08-27 MED ORDER — VANCOMYCIN PHARMACY TO MANAGE
1 | 0 refills | Status: DC
Start: 2024-08-27 — End: 2024-09-02

## 2024-08-27 MED ORDER — VANCOMYCIN 2,250 MG IVPB
15 mg/kg | INTRAVENOUS | 0 refills | Status: DC
Start: 2024-08-27 — End: 2024-08-27

## 2024-08-27 MED ORDER — HYDROCORTISONE SOD SUCC (PF) 100 MG/2 ML IJ SOLR
50 mg | INTRAVENOUS | 0 refills | Status: DC
Start: 2024-08-27 — End: 2024-08-29
  Administered 2024-08-27 – 2024-08-29 (×8): 50 mg via INTRAVENOUS

## 2024-08-27 MED ORDER — PIPERACILLIN/TAZOBACTAM 4.5 G/100ML NS IVPB (MB+)
4.5 g | INTRAVENOUS | 0 refills | Status: CP
Start: 2024-08-27 — End: ?
  Administered 2024-08-27 (×2): 4.5 g via INTRAVENOUS

## 2024-08-27 MED ORDER — TUBE FEED (ENAR)
0 refills | Status: DC
Start: 2024-08-27 — End: 2024-08-28

## 2024-08-27 MED ORDER — PANCRELIPASE-SODIUM BICARBONATE 20,880 K UNIT-650 MG
GASTROSTOMY | 0 refills | Status: DC | PRN
Start: 2024-08-27 — End: 2024-09-10

## 2024-08-27 MED ORDER — PIPERACILLIN/TAZOBACTAM 4.5 G/NS (MB+)(EXTENDED INFUSION)
4.5 g | INTRAVENOUS | 0 refills | Status: CP
Start: 2024-08-27 — End: ?
  Administered 2024-08-27 – 2024-09-03 (×54): 4.5 g via INTRAVENOUS

## 2024-08-27 MED ORDER — VANCOMYCIN RANDOM DOSING
1 | INTRAVENOUS | 0 refills | Status: DC
Start: 2024-08-27 — End: 2024-09-02

## 2024-08-27 MED ORDER — ACETAMINOPHEN 160 MG/5 ML PO SOLN
650 mg | ORAL | 0 refills | Status: DC | PRN
Start: 2024-08-27 — End: 2024-09-01
  Administered 2024-08-27 – 2024-09-01 (×5): 650 mg via ORAL

## 2024-08-27 NOTE — Care Plan [600008]
 Problem: Discharge Planning  Goal: Participation in plan of care  Outcome: Goal Ongoing  Goal: Knowledge regarding plan of care  Outcome: Goal Ongoing  Goal: Prepared for discharge  Outcome: Goal Ongoing     Problem: Skin Integrity  Goal: Skin integrity intact  Outcome: Goal Ongoing  Goal: Healing of skin (Wound & Incision)  Outcome: Goal Ongoing  Goal: Healing of skin (Pressure Injury)  Outcome: Goal Ongoing     Problem: Injury-Risk of, Non-Violent Physical Restraints  Goal: Absence of Injury while physically restrained (Non-Violent)  Outcome: Goal Ongoing     Problem: Glucose Management  Goal: Absence of hyperglycemia  Outcome: Goal Ongoing  Goal: Absence of Hypoglycemia  Outcome: Goal Ongoing  Goal: Glucose level within specified parameters  Outcome: Goal Ongoing

## 2024-08-27 NOTE — Progress Notes [1]
 MEDICAL INTENSIVE CARE UNIT  PROGRESS NOTE       Patient's Name:  Matthew Paul MRN: [id number]   Patient's DOB: 11/21/1965   Today's Date:  08/27/2024  Admission Date: 08/24/2024  LOS: 2 days    Problem list  Principal Problem:    Acquired bilateral renal cysts  Active Problems:    ADPKD (autosomal dominant polycystic kidney disease)    Cardiac arrest (CMS-HCC)    Acute kidney injury superimposed on CKD    Obstructive cardiovascular shock (CMS-HCC)    Lactic acidosis    High anion gap metabolic acidosis    Acute pulmonary embolism with acute cor pulmonale (CMS-HCC)    Acute respiratory failure with hypoxia (CMS-HCC)      HOSPITAL COURSE SUMMARY     Plato Alspaugh is a 58 y.o. 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  Patient admitted initially on 11/14 for planned left robotic renal cyst decortication and cystoscopy with ureteral stent placement.  No complications during procedure and patient had existing JP drain placed.  Unfortunately, patient suffered PEA cardiac arrest on 11/15 with ROSC following 2 rounds of epinephrine .    Patient was transported to the ICU on 11/15 and initially required high vent settings [PEEP 10, FiO2 100%] and pressor support with NE and vaso.  Cardiology consulted for abnormal EKG with new RBBB.  Mild metabolic derangements on initial labs without electrolyte disturbances.  Formal TTE stat with EF 55%, grade 1 diastolic dysfunction, and moderate to severely dilated LV with PASP 34.  CTA chest 11/15 showing massive bilateral PE, right greater than left with significant RV strain.  CT AP without evidence of hemorrhage or acute abdominal pathology.  After much discussion with family regarding risk/benefit, started on therapeutic heparin  drip 11/15.     Interval update 08/27/2024:  > Continue ventilation and sedation with prop and fent, wean as able  > Continue heparin  gtt, monitor with Xa  > Continue Levo and vaso gtt - maintain goal MAP >=65  > Start trickle feeds  > Patient became febrile, required increased FiO2, and intermittent increasing pressor requirements   > Started vanc and zosyn  ; IV hydrocortisone  50 mg q6h   > Obtained CXR which showed consolidation in the left lower lobe that could be consistent with infection.    > Ordered mrsa nares (+), blood cx, sputum cx, urine cx   > Started chest physiotherapy/ PD&V  > Hold off on RT trials while patient continues to have increasing pressor    ASSESSMENT & PLAN     NEURO/PSYCH  #Sedated   - Initially post-code: moving all extremities, opening eyes, attempting to pull at tube prior to sedation   PLAN:  > Sedation w/ prop & fent, wean as able   > Triglycerides 186, continue to trend and may consider switching from propofol  to precedex  if triglycerides increase > 300  > No concern for mentation at this time as requiring high levels of sedation, defer CTH.       -----------------------------------------------------------------------------------------------------------------------------------------------------------------------------------------------------------------------  PULMONARY  #Mechanical intubation for airway protection   #Bilateral massive PE  - Intubated on 11/15 post PEA arrest. ETT advanced to 28 at the teeth   - Bedside POCUS concerning for RV overload and McConnell's sign.   - TTE: RV overload further c/f PE  - CTA Chest: multiple BL PE, R>L. Elevated RV:LV c/w  right heart strain w/ dilation of RA, RV, main PA  - MRSA nares +  PLAN:  >Invasive Mode: V/AC+  Set VT (mL):  [550 milliliters]   Expired VT Spontaneous (mL):  [0 milliliters]   Expiratory VT (mL):  [375 mL-606 mL]   Set RR:  [20 breaths/minutes-25 breaths/minutes]   Total Respiratory Rate:  [21 breaths/minutes-26 breaths/minutes]   Minute Volume (Lpm):  [11.6 liters/minutes-14.5 liters/minutes]   %MVspontaneous:  [0 %]   PIP Actual (cmH2O):  [26 cm H20-39 cm H20]   PEEP/CPAP (cmH2O):  [5 cm H2O-8 cm H2O]   Intrinsic PEEP (cmH2O):  [7 cmH2O]   Mean Airway Pressure (cmH2O):  [11 cm H2O-16 cm H2O]   > Repeat ABG w/ vent changes   > CTA Chest showing bilateral massive PE.  Had a long discussion with family, pharmacy, interventional radiology, and overall multidisciplinary approach and discussion for tPA versus heparin .  Earlier in the conversation, patient was becoming more hemodynamically unstable and borderline needing to add a third pressor.  Thankfully, we were able to come down on sedation which decreased his pressor requirements.  With this, decided to start him on therapeutic heparin  and discussed dosing with pharmacy.  Discussed risks/benefit with family at length especially given his recent surgical procedure yesterday and existing JP drain, along with potential for existing cerebral aneurysms that are often associated with autosomal dominant PKD.  They understand this and would like to continue forward with heparin  GGT.                 > Frequent neurovascular checks               > If becomes hemodynamically unstable or repeat cardiac arrest, will start tPA.  Pharmacy aware.  > Continue heparin  gtt - monitor Xa  > Obtained CXR which showed increasing left lower lobe consolidation which could be infection, aspiration, or worsening atelectasis.  > Sputum culture, blood culture, urine culture obtained   > Began vancomycin  and zosyn  today to treat pneumonia empirically due to fever and possible pneumonia on CXR   > MRSA nares +, continue vanc  > Began hydrocortisone  50 mg IV q6h  > Chest physiotherapy and PD&V   > Hold off on RT trials today while patient continues to require intermittent increasing pressors    -----------------------------------------------------------------------------------------------------------------------------------------------------------------------------------------------------------------------  CARDIOVASCULAR  #PEA arrest   #Shock - obstructive 2/2 PE   #Remote history of Afib (1980s) not on AC   - PEA arrest on 11/15, ROSC after 2 rounds of epi and total code lasting 4-6 minutes.  - PTA: ASA 81 mg daily  - EKG:               Previous OSH 2020: SR, Qtc 406               EKG (11/15 post code): Sinus tachy 100s, Qtc 452, new RBBB  - Pressors: NE, vaso   - Echo (11/15): EF 55%. No dia dysfunction. Mod to severely enlarged RV w/ intraventricular septal flattening. PASP 35 + CVP. No major valvular abnormalities.   - CTA Chest: massive bl PE with RV strain.   - CT AP: normal post-surgical changes.   - Trop: 138   PLAN:  > PEA arrest due to obstructive shock from massive bilateral PE.  He has a new right bundle branch block, likely due to this.  Discussed with interventional radiology, not amenable to thrombectomy at this time due to location.    > Remains on  norepi and vasopressin  for additional pressor support.                > Goal MAP > 65    #HLD   - PTA: atorvastatin  20 mg   - Lipid profile: chol 124, TG 208, HDL 33, LDL 82  PLAN:  > HOLD atorvastatin  given LFTs     #HTN  - PTA meds: HCTZ 25 mg qAM, lisinopril  20 mg daily  PLAN:  > HOLD HCTZ and lisinopril  - currently in multipressor shock     -----------------------------------------------------------------------------------------------------------------------------------------------------------------------------------------------------------------------  FEN/GI  #Elevated LFTs, likely post-code, resolved  #GERD  - Post code LFTs AST 157 (13 on admit), ALT 163 (12 on admit). Tbili 0.4.  - PTA meds: omeprazole 40 mg daily   PLAN:  > Hold atorvastatin    > Daily CMP   > Start trickle feeds today   > Continue PPI daily    -----------------------------------------------------------------------------------------------------------------------------------------------------------------------------------------------------------------------  RENAL  #ADPKD  #AKI on CKD   #HAGMA, mild likely 2/2 above further renal dysfunction  - PTA meds: jynarque  (valptan) 60 mg qAM / 30 mg qPM  - Baseline Cr 2.5 - 2.8   -11/14 underwent left robotic renal cyst decortication, cystoscopy, L ureteral stent placement for enlarging bilateral renal size causing pressure and discomfort in the setting of ADPKD.  Total of roughly 100 cysts were decorticated with 19 large cysts sent for pathology.  Placement of left ureteral stent, confirmation prior to surgical completion that left ureter had no urine leak.  No overt post surgical complications.  - CT AP (11/15): normal post-operative changes  Recent Labs     08/25/24  1258 08/26/24  0313 08/27/24  0230   CR 3.69* 3.32* 3.23*   BUN 50* 49* 49*   GFR 18* 21* 21*     - Net IO Since Admission: 4,880.14 mL [08/27/24 1536]  -   Intake/Output Summary (Last 24 hours) at 08/27/2024 1536  Last data filed at 08/27/2024 1500  Gross per 24 hour   Intake 2488 ml   Output 2175 ml   Net 313 ml   PLAN:  > JP drain in place with ~ 320 mL out in the last 24 hours, per urology suspected/usual amount of fluid drainage.  > Maintain foley catheter  > AKI stable, continue strict I's and O's, avoid further nephrotoxic agents, renally dose medications as able.      -----------------------------------------------------------------------------------------------------------------------------------------------------------------------------------------------------------------------  ENDOCRINE  #No acute concerns   - Invalid input(s): GLUCOSE  Recent Labs     08/25/24  1357 08/25/24  2101 08/26/24  0632 08/26/24  1116 08/26/24  1753 08/26/24  2323 08/27/24  0528 08/27/24  1131   GLUPOC 183* 236* 198* 166* 145* 120* 148* 134* PLAN:  > CTM  > LDCF      -----------------------------------------------------------------------------------------------------------------------------------------------------------------------------------------------------------------------  HEME/ONC  #Leukocytosis, likely reactive (post-surgical vs cardiac arrest)    #DVT/PE  - 11/16 doppler BLE: acute appearing nonocclusive thrombus in L popliteal vein  Recent Labs     08/25/24  1258 08/26/24  0313 08/27/24  0230   HGB 14.0 14.3 13.4*   MCV 95.4 95.6 93.7   PLTCT 204 175 163   PLAN:  > Monitor HGB, Transfuse if Hgb <7 and PLT < 10   > DVT Ppx: Has been on heparin  prior to procedure on initial admission   > No evidence of acute bleeding, especially with uptrending hgb. Ongoing monitor for s/s of bleeding.   > If significant HD instability: obtain TEG, CBC STAT and  if dropping hgb consider fast scan for eval of retroperitoneal bleed/hematoma. At this time, no concerns for such.   > Continue heparin  gtt - monitoring with Xa     -----------------------------------------------------------------------------------------------------------------------------------------------------------------------------------------------------------------------  ID  #Febrile  Recent Labs     08/25/24  1258 08/26/24  0313 08/27/24  0230   WBC 17.70* 14.10* 11.40*     - Temp (24hrs), Avg:37.7 ?C (99.8 ?F), Min:36.9 ?C (98.4 ?F), Max:38.6 ?C (101.5 ?F)      Culture Data Date collected Result   Blood cx 11/17    Sputum cx 11/17    Urine cx 11/17      Antimicrobial First dose Last dose Comment   Ancef  11/14 11/14 X2, for perioperative abx   Vancomycin  11/17     Zosyn  11/17       PLAN:  > Patient initially had a mild fever on 11/16, then began to be more persistently febrile on 11/17  > Obtained CXR which showed increasing left lower lobe consolidation which could be infection, aspiration, or worsening atelectasis.  > Sputum culture, blood culture, urine culture obtained   > Began zosyn  and vancomycin  today to treat pneumonia empirically due to fever and possible pneumonia on CXR   > MRSA nares +, continue vanc  > Began hydrocortisone  50 mg IV q6h  > Chest physiotherapy and PD&V    -----------------------------------------------------------------------------------------------------------------------------------------------------------------------------------------------------------------------  MSK/DERM  #Gout   PLAN:  > Hold PTA allopurinol , colchicine  0.6 mg prn for flares     -----------------------------------------------------------------------------------------------------------------------------------------------------------------------------------------------------------------------  LDA/PROPHYLAXIS  Patient Lines/Drains/Airways Status       Active Lines:       Name Placement date Placement time Site Days    CVC Triple Femoral, right 08/25/24  1326  -- 2    Arterial Line Radial, right 08/25/24  1850  -- 2    Surgical Drain Bulb (e.g. JP) Left Abdomen #1 08/24/24  1733  -- 3    Temporary GI Tube Orogastric 08/26/24  1200  -- 1    Indwelling Urinary Catheter 16 FR Standard 2-way 08/27/24  1100  -- less than 1    Peripheral IV 08/24/24 1101 Left Distal;Posterior Forearm 20 G 08/24/24  1101  -- 3                    Lines: PIV x2, CVC R femoral (11/15 -), R radial A line (11/15 -)   Tubes: JP drain L abd (11/14 -), ETT (11/15 -)  Urinary Catheter:  Yes (11/15 -)  VTE ppx: SCDs, heparin  gtt  GI:  PPI  Bowel regimen: PRNs provided  Diet: Regular Diet  DIET NPO Strict  Code status: Full Code     Patient case discussed with attending physician, Dr. Alan LITTIE Constable, MD     Ludie Overly, MD  Internal Medicine PGY-1   Available on Voalte    SUBJECTIVE     NAEON. Patient remains intubated, sedated, on mechanical ventilation. Seen opening eyes and moving all extremities this morning. Follows commands.     Past Medical History:    Acid reflux    ADPKD (autosomal dominant polycystic kidney)    Apnea    Gout Hyperlipidemia    Hypertension    Mild shortness of breath    Paroxysmal A-fib (CMS-HCC)     Surgical History:   Procedure Laterality Date    HX HERNIA REPAIR Right 1983    ROBOTIC LAPAROSCOPIC ABLATION RENAL CYSTS Right 06/08/2019    Performed by Erskin Lenis, MD at [facility]  OR    ROBOT ASSISTED SURGERY N/A 06/08/2019    Performed by Erskin Lenis, MD at [facility] OR    CYSTOURETHROSCOPY WITH INDWELLING URETERAL STENT INSERTION Right 06/08/2019    Performed by Erskin Lenis, MD at [facility] OR    ROBOTIC ASSISTED LAPAROSCOPIC RENAL CYST DECORTICATION Left 10/02/2019    Performed by Erskin Lenis, MD at [facility] OR    CYSTOURETHROSCOPY WITH INDWELLING URETERAL STENT INSERTION Left 10/02/2019    Performed by Erskin Lenis, MD at [facility] OR    ABLATION, CYST, KIDNEY, LAPAROSCOPIC Left 08/24/2024    Performed by Erskin Lenis LABOR, MD at [facility] OR    ROBOT ASSISTED SURGERY Left 08/24/2024    Performed by Erskin Lenis LABOR, MD at [facility] OR    CYSTOURETHROSCOPY, WITH INDWELLING URETERAL STENT INSERTION Left 08/24/2024    Performed by Erskin Lenis LABOR, MD at [facility] OR    HX TONSILLECTOMY      KIDNEY CYST REMOVAL      Multiple    KNEE CARTILAGE SURGERY Bilateral      No family history on file.  Social History[1]  Vaping/E-liquid Use    Vaping Use Never User      Vaping/E-liquid Substances    CBD No     Nicotine No     Other No     Flavored No     THC No     Unknown No             Current Medications[2]       OBJECTIVE                          Vital Signs: Last Filed                 Vital Signs: 24 Hour Range   Temp: 38.6 ?C (101.5 ?F) (11/17 1500)  Pulse: 112 (11/17 1500)  Respirations: 19 PER MINUTE (11/17 1500)  SpO2: 92 % (11/17 1500)  O2%: 90 % (11/17 1500)  O2 Device: Ventilator (11/17 1500) ABP: (60-132)/(46-71)   Temp:  [36.9 ?C (98.4 ?F)-38.6 ?C (101.5 ?F)]   Pulse:  [85-113]   Respirations:  [9 PER MINUTE-25 PER MINUTE]   SpO2:  [88 %-98 %]   O2%:  [55 %-90 %]   O2 Device: Ventilator     Vitals:    08/25/24 1227 08/25/24 1509 08/26/24 1000 Weight: (!) 144.5 kg (318 lb 9 oz) (!) 144.2 kg (318 lb) (!) 144.2 kg (317 lb 14.5 oz)         Artificial Airway   Endotracheal Tube       Ventilator/Respiratory Therapy  Yes: Invasive Mode: V/AC+  Set VT (mL):  [550 milliliters]   Expired VT Spontaneous (mL):  [0 milliliters]   Expiratory VT (mL):  [375 mL-606 mL]   Set RR:  [20 breaths/minutes-25 breaths/minutes]   Total Respiratory Rate:  [21 breaths/minutes-26 breaths/minutes]   Minute Volume (Lpm):  [11.6 liters/minutes-14.5 liters/minutes]   %MVspontaneous:  [0 %]   PIP Actual (cmH2O):  [26 cm H20-39 cm H20]   PEEP/CPAP (cmH2O):  [5 cm H2O-8 cm H2O]   Intrinsic PEEP (cmH2O):  [7 cmH2O]   Mean Airway Pressure (cmH2O):  [11 cm H2O-16 cm H2O]      Vent Weaning   Per protocol    Physical Exam  Constitutional: 58 y.o. male intubated and sedated  Head: Normocephalic, atraumatic  Cardiovascular: Regular rhythm, tachycardic rate  Pulmonary: Clear to auscultation bilaterally, no wheezes or rales  GI: Abdomen soft, non-tender, non-distended (baseline habitus)  Skin: No rashes or bruises, good turgor, cap refill <2s  Neuro: Opening eyes, moving all extremities spontaneously.  Musculoskeletal: No deformities  Lymphatic/extremities: No pitting edema, pulses present b/l    Laboratory:  Recent Labs     08/25/24  0336 08/25/24  1258 08/26/24  0313 08/27/24  0230   NA 141 137 130* 130*   K 5.3* 4.9 4.9 4.3   CL 109 105 102 100   CO2 23 17* 15* 19*   GAP 9 15* 13* 11   BUN 47* 50* 49* 49*   CR 3.23* 3.69* 3.32* 3.23*   GLU 133* 207* 219* 147*   CA 8.8 9.9 9.5 9.5   ALBUMIN   --  3.8 3.5 3.3*   MG  --  2.0 2.2 1.9       Recent Labs     08/25/24  0336 08/25/24  1258 08/25/24  1825 08/26/24  0028 08/26/24  0149 08/26/24  0313 08/26/24  0825 08/26/24  1113 08/26/24  1749 08/27/24  0230   WBC 9.20 17.70*  --   --   --  14.10*  --   --   --  11.40*   HGB 13.2* 14.0  --   --   --  14.3  --   --   --  13.4*   HCT 40.4 43.3  --   --   --  43.0  --   --   --  40.2   PLTCT 176 204  -- --   --  175  --   --   --  163   PTT  --   --  27.2 >200.0 >200.0  --  182.2* 62.3* 99.4*  --    AST  --  157*  --   --   --  79*  --   --   --  30   ALT  --  163*  --   --   --  64*  --   --   --  17   ALKPHOS  --  67  --   --   --  62  --   --   --  61      Estimated Creatinine Clearance: 39.7 mL/min (A) (by C-G formula based on SCr of 3.23 mg/dL (H)).  Vitals:    08/25/24 1227 08/25/24 1509 08/26/24 1000   Weight: (!) 144.5 kg (318 lb 9 oz) (!) 144.2 kg (318 lb) (!) 144.2 kg (317 lb 14.5 oz)      Recent Labs     08/27/24  0230 08/27/24  1431   PHART 7.31* 7.30*   PO2ART 85 63*         Pertinent radiology reviewed.    CHEST SINGLE VIEW   Final Result         Endotracheal tube with the tip approximately 8 cm above the carina. This could be slightly advanced for more optimal positioning.      Gastric tube courses below the diaphragm and out of the field-of-view.      Increasing left lower lobe consolidation which could be infection, aspiration, or worsening atelectasis.          Finalized by Eleanor Don, M.D. on 08/27/2024 12:25 PM. Dictated by Eleanor Don, M.D. on 08/27/2024 12:23 PM.      FEEDING TUBE PLCMNT (ABD/CHEST LMTD)   Final Result         Gastric tube with  sidehole projecting over the body of the stomach and tip projecting at the gastric antrum.       The stomach is mildly distended.       Patchy bibasilar opacities are better demonstrated on recent CT chest.      By my electronic signature, I attest that I have personally reviewed the images for this examination and formulated the interpretations and opinions expressed in this report          Finalized by Christobal SHAUNNA Sawyer, MD on 08/26/2024 1:35 PM. Dictated by Mont Lay, MD on 08/26/2024 1:28 PM.      US  VENOUS DOPPLER BILATERAL   Final Result         1.  Acute appearing nonocclusive thrombus in the left popliteal vein.      2.  No femoral/popliteal deep venous thrombosis in right lower extremity.      By my electronic signature, I attest that I have personally reviewed the images for this examination and formulated the interpretations and opinions expressed in this report          Finalized by Allean Pouch, M.D. on 08/26/2024 11:24 AM. Dictated by Mont Lay, MD on 08/26/2024 11:14 AM.      CTA CHEST PULM EMBOLISM W/CONT   Final Result         CTA CHEST:      1. Multiple bilateral pulmonary emboli as described above, right greater than left. Elevated RV: LV ratio consistent with right heart strain, with dilatation of the right atrium, right ventricle and main pulmonary artery. Borderline cardiomegaly.   2. Moderate left lower lobe with additional areas of mild bilateral atelectasis, without significant pulmonary infarct or pleural effusion.      CT abdomen and pelvis:      1. Postsurgical changes of reported recent left robotic renal cyst decortication, with left perinephric stranding and multifocal gas. Small amount of free air is noted and likely postoperative. Surgical drain extending to the left lateral perinephric region, with no abdominal or pelvic fluid collection.   2. Redemonstration of marked bilateral renal enlargement with innumerable cysts, consistent with autosomal dominant polycystic kidney disease. 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   3. Bladder is decompressed about a Foley catheter, limiting evaluation.   4. Colonic diverticulosis without acute diverticulitis or bowel obstruction.   5. Trace pelvic free fluid, nonspecific though possibly postoperative.      Major preliminary findings including presence of extensive pulmonary emboli with right heart strain was conveyed to Dr. Iannazzo by Dairl messenger at 4:34 PM on 08/25/2024.          Finalized by Norleen Pane, M.D. on 08/25/2024 4:35 PM. Dictated by Norleen Pane, M.D. on 08/25/2024 4:12 PM.      CT ABD/PELV W CONTRAST   Final Result         CTA CHEST:      1. Multiple bilateral pulmonary emboli as described above, right greater than left. Elevated RV: LV ratio consistent with right heart strain, with dilatation of the right atrium, right ventricle and main pulmonary artery. Borderline cardiomegaly.   2. Moderate left lower lobe with additional areas of mild bilateral atelectasis, without significant pulmonary infarct or pleural effusion.      CT abdomen and pelvis:      1. Postsurgical changes of reported recent left robotic renal cyst decortication, with left perinephric stranding and multifocal gas. Small amount of free air is noted and likely postoperative. Surgical  drain extending to the left lateral perinephric region, with no abdominal or pelvic fluid collection.   2. Redemonstration of marked bilateral renal enlargement with innumerable cysts, consistent with autosomal dominant polycystic kidney disease. 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   3. Bladder is decompressed about a Foley catheter, limiting evaluation.   4. Colonic diverticulosis without acute diverticulitis or bowel obstruction.   5. Trace pelvic free fluid, nonspecific though possibly postoperative.      Major preliminary findings including presence of extensive pulmonary emboli with right heart strain was conveyed to Dr. Iannazzo by Dairl messenger at 4:34 PM on 08/25/2024.          Finalized by Norleen Pane, M.D. on 08/25/2024 4:35 PM. Dictated by Norleen Pane, M.D. on 08/25/2024 4:12 PM.      2D + DOPPLER ECHO   Final Result      CHEST SINGLE VIEW   Final Result   FINDINGS/IMPRESSION:        Support Devices: Endotracheal tube has been slightly advanced, with the tip now projecting 3 cm above the carina. This could be slightly advanced for optimal positioning.      Lungs/Pleura: Inferior aspects of both lungs, particularly the left, are excluded from the field-of-view,. Patchy bibasilar opacities, likely atelectasis. No pneumothorax.      Heart and Mediastinum: The cardiomediastinal silhouette is stable.             Finalized by JOSETTE BEAGLE, M.D. on 08/25/2024 12:48 PM. Dictated by JOSETTE BEAGLE, M.D. on 08/25/2024 12:46 PM.      CHEST SINGLE VIEW   Final Result   FINDINGS/IMPRESSION:        Support Devices: Endotracheal tube in place, with the tip projecting 0.5 cm above the carina. Advancement is recommended..      Lungs/Pleura: Inferior aspect of the left lung, and inferior right costophrenic angle are excluded from the film.SABRA Patchy bibasilar opacities, likely atelectasis. Mild pulmonary edema.. No pneumothorax.SABRA      Heart and Mediastinum: Cardiac silhouette is incompletely visualized, although likely within normal limits.          Finalized by JOSETTE BEAGLE, M.D. on 08/25/2024 12:49 PM. Dictated by JOSETTE BEAGLE, M.D. on 08/25/2024 12:48 PM.      US  EXTREMITY LIMITED RIGHT    (Results Pending)        Scheduled Meds:[Held by Provider] allopurinoL  (ZYLOPRIM ) tablet 100 mg, 100 mg, Oral, QDAY  [Held by Provider] atorvastatin  (LIPITOR) tablet 10 mg, 10 mg, Oral, QDAY  bacitracin  zinc  topical ointment, , Topical, BID  [Held by Provider] hydroCHLOROthiazide  (HYDRODIURIL ) tablet 25 mg, 25 mg, Oral, QAM8  hydrocortisone  sod succ (PF) (Solu-CORTEF ) injection 50 mg, 50 mg, Intravenous, Q6H*  insulin  aspart (U-100) (NOVOLOG  FLEXPEN U-100 INSULIN ) injection PEN 0-6 Units, 0-6 Units, Subcutaneous, Q6H  pantoprazole  (PROTONIX ) injection 40 mg, 40 mg, Intravenous, QDAY  petrolatum  (STYE) ophthalmic ointment 0.25 inch, 0.25 inch, Both Eyes, Q6H  piperacillin /tazobactam (ZOSYN ) 4.5 g in sodium chloride  0.9% (NS) 100 mL IVPB (MB+)(EXTENDED INFUSION), 4.5 g, Intravenous, Q6H*  polyethylene glycol 3350  (MIRALAX ) packet 17 g, 1 packet, Feeding Tube, BID  Protein Supplement Packets, , SEE ADMIN INSTRUCTIONS, TID  sennosides-docusate sodium  (SENOKOT-S) tablet 1 tablet, 1 tablet, Per OG Tube, QDAY  vancomycin  (VANCOCIN ) 2,500 mg in sodium chloride  0.9% 550 mL IVPB, 2,500 mg, Intravenous, ONCE  vancomycin , random dosing, 1 each, Intravenous, Random Dosing    Continuous Infusions:   Diet Critical Care Enteral Feeding Volume Based Infusion 20 mL/hr  at 08/27/24 1115    fentaNYL  (SUBLIMAZE ) 1000 mcg/100 mL NS IV drip (std conc)(premade) 45 mcg/hr (08/27/24 1502)    heparin  (porcine) 25,000 units in dextrose  5% (D5W) 250 mL IV infusion (std conc) 1,500 Units/hr (08/27/24 1321)    norepinephrine  (LEVOPHED ) 16 mg in dextrose  5% (D5W) 250 mL IV drip (quad conc) 0.28 mcg/kg/min (08/27/24 1524)    propofoL  (DIPRIVAN ) 10 mg/mL IV drip 45 mcg/kg/min (08/27/24 1503)    sodium chloride  0.9% TKO infusion 5 mL/hr at 08/26/24 1026    vasopressin  (VASOSTRICT ) 20 units in sodium chloride  0.9% (NS) 100 mL IV infusion (std conc)(premade) 1.8 Units/hr (08/27/24 0505)     PRN and Respiratory Meds:acetaminophen  Q6H PRN, dextrose  50% PRN, fentaNYL  TITRATE **AND** fentaNYL  Q30 MIN PRN, heparin  (porcine) TITRATE **AND** heparin  (porcine) Q6H PRN, pancrelipase  20,880 Units/sodium bicarbonate  650 mg ([place] CLOG DESTROYER) PRN (On Call from Rx), vancomycin , pharmacy to manage Per Pharmacy      Malnutrition Details:              Loss of Subcutaneous Fat: No      Muscle Wasting: No                  Active Wounds                                        [1]   Social History  Socioeconomic History    Marital status: Divorced   Tobacco Use    Smoking status: Never    Smokeless tobacco: Former     Types: Chew     Quit date: 1990    Tobacco comments:     Chewed x10 years   Vaping Use    Vaping status: Never Used   Substance and Sexual Activity    Alcohol use: Yes     Alcohol/week: 2.0 standard drinks of alcohol     Types: 2 Cans of beer per week     Comment: Occasional    Drug use: Never   [2]   Current Facility-Administered Medications:     acetaminophen  oral solution 650 mg, 650 mg, Oral, Q6H PRN, Jobe, Alan CROME, MD, 650 mg at 08/27/24 1008    [Held by Provider] allopurinoL  (ZYLOPRIM ) tablet 100 mg, 100 mg, Oral, QDAY, Marty Toribio SAUNDERS, MD, 100 mg at 08/25/24 9062    [Held by Provider] atorvastatin  (LIPITOR) tablet 10 mg, 10 mg, Oral, QDAY, Marty Toribio SAUNDERS, MD, 10 mg at 08/25/24 9062    bacitracin  zinc  topical ointment, , Topical, BID, Marty Toribio SAUNDERS, MD, Given at 08/27/24 9152    dextrose  50% (D50) syringe 25-50 mL, 12.5-25 g, Intravenous, PRN, Luddie Boghosian, MD    Diet Critical Care Enteral Feeding Volume Based Infusion, , SEE ADMIN INSTRUCTIONS, Continuous, Janine Dukes, MD, Last Rate: 20 mL/hr at 08/27/24 1115, New Bag at 08/27/24 1115    fentaNYL  (SUBLIMAZE ) 1000 mcg/100 mL NS IV drip (std conc)(premade), 10-70 mcg/hr, Intravenous, TITRATE, Last Rate: 4.5 mL/hr at 08/27/24 1502, 45 mcg/hr at 08/27/24 1502 **AND** fentaNYL  (SUBLIMAZE ) BOLUS for continuous infusion, 25 mcg, Intravenous, Q30 MIN PRN, Jackquline Castleman, MD, 25 mcg at 08/27/24 0820    heparin  (porcine) 25,000 units in dextrose  5% (D5W) 250 mL IV infusion (std conc), 0-3,000 Units/hr, Intravenous, TITRATE, Last Rate: 15 mL/hr at 08/27/24 1321, 1,500 Units/hr at 08/27/24 1321 **AND** heparin  (porcine) BOLUS for continuous infusion (bag) 2,884-5,768 Units, 20-40 Units/kg (Dosing  Weight), Intravenous, Q6H PRN, Ngo, Kevin, MD    [Held by Provider] hydroCHLOROthiazide  (HYDRODIURIL ) tablet 25 mg, 25 mg, Oral, QAM8, Marty Toribio SAUNDERS, MD    hydrocortisone  sod succ (PF) (Solu-CORTEF ) injection 50 mg, 50 mg, Intravenous, Q6H*, Jobe, Alan CROME, MD, 50 mg at 08/27/24 1427    insulin  aspart (U-100) (NOVOLOG  FLEXPEN U-100 INSULIN ) injection PEN 0-6 Units, 0-6 Units, Subcutaneous, Q6H, Agapito Ned, DO    norepinephrine  (LEVOPHED ) 16 mg in dextrose  5% (D5W) 250 mL IV drip (quad conc), 0-0.5 mcg/kg/min, Intravenous, TITRATE, Jackquline Castleman, MD, Last Rate: 35.3 mL/hr at 08/27/24 1524, 0.28 mcg/kg/min at 08/27/24 1524    pancrelipase  20,880 Units/sodium bicarbonate  650 mg ([place] CLOG DESTROYER), , Feeding Tube, PRN (On Call from Rx), Elton Heid, MD    pantoprazole  (PROTONIX ) injection 40 mg, 40 mg, Intravenous, QDAY, Provider, Pharmacy, 40 mg at 08/27/24 0825    petrolatum  (STYE) ophthalmic ointment 0.25 inch, 0.25 inch, Both Eyes, Q6H, Jobe, Amanda L, MD, 0.25 inch at 08/27/24 1233    piperacillin /tazobactam (ZOSYN ) 4.5 g in sodium chloride  0.9% (NS) 100 mL IVPB (MB+)(EXTENDED INFUSION), 4.5 g, Intravenous, Q6H*, Kyden Potash, MD    polyethylene glycol 3350  (MIRALAX ) packet 17 g, 1 packet, Feeding Tube, BID, Iannazzo, Emily G, DO, 17 g at 08/27/24 0825    propofoL  (DIPRIVAN ) 10 mg/mL IV drip, 5-70 mcg/kg/min, Intravenous, TITRATE, Jackquline Castleman, MD, Last Rate: 36.3 mL/hr at 08/27/24 1503, 45 mcg/kg/min at 08/27/24 1503    Protein Supplement Packets, , SEE ADMIN INSTRUCTIONS, TID, Yoltzin Barg, MD, Given at 08/27/24 1239    sennosides-docusate sodium  (SENOKOT-S) tablet 1 tablet, 1 tablet, Per OG Tube, QDAY, Iannazzo, Emily G, DO, 1 tablet at 08/27/24 0825    sodium chloride  0.9% TKO infusion, , Intravenous, Continuous, Jobe, Amanda L, MD, Last Rate: 5 mL/hr at 08/26/24 1026, New Bag at 08/26/24 1026    vancomycin  (VANCOCIN ) 2,500 mg in sodium chloride  0.9% 550 mL IVPB, 2,500 mg, Intravenous, ONCE, Provider, Pharmacy, Last Rate: 220 mL/hr at 08/27/24 1512, 2,500 mg at 08/27/24 1512    vancomycin , pharmacy to manage, 1 each, Service, Per Pharmacy, [place] Alan CROME, MD    vancomycin , random dosing, 1 each, Intravenous, Random Dosing, Provider, Pharmacy    vasopressin  (VASOSTRICT ) 20 units in sodium chloride  0.9% (NS) 100 mL IV infusion (std conc)(premade), 1.8 Units/hr, Intravenous, Continuous, Jackquline Castleman, MD, Last Rate: 9 mL/hr at 08/27/24 0505, 1.8 Units/hr at 08/27/24 0505

## 2024-08-27 NOTE — Progress Notes [1]
 Pharmacy Vancomycin  Initiation Note  Subjective:   Matthew Paul is a 58 y.o. male being treated for PNA.    Assessment:   Target levels for this patient:  1.  AUC (mcg*h/mL):  400-600  2.  Trough (mcg/mL): 15-20    Plan:   Give vancomycin  2500 mg once followed by random dosing due to kidney function  Next scheduled level(s): 24-36 hours after loading dose  Pharmacy will continue to monitor and adjust therapy as needed.    Rocky Brass) Orene, PharmD, BCCCP  Clinical Pharmacist--Critical Care/Emergency Department  08/27/2024    Objective:     Current Vancomycin  Orders   Medication Dose Route Frequency    vancomycin  (VANCOCIN ) 2,500 mg in sodium chloride  0.9% 550 mL IVPB  2,500 mg Intravenous ONCE    vancomycin , pharmacy to manage  1 each Service Per Pharmacy    vancomycin , random dosing  1 each Intravenous Random Dosing       Recent Vancomycin  Dosing and Administration:  Recent Vancomycin  Admin        Ordered but not administered                    Start date of vancomycin  therapy: 08/27/2024  Additional Abx: pip/tazo  Cultures: in progress  White Blood Cells   Date/Time Value Ref Range Status   08/27/2024 0230 11.40 (H) 4.50 - 11.00 10*3/uL Final   08/26/2024 0313 14.10 (H) 4.50 - 11.00 10*3/uL Final   08/25/2024 1258 17.70 (H) 4.50 - 11.00 10*3/uL Final   08/25/2024 0336 9.20 4.50 - 11.00 10*3/uL Final     Creatinine   Date/Time Value Ref Range Status   08/27/2024 0230 3.23 (H) 0.40 - 1.24 mg/dL Final   88/83/7974 9686 3.32 (H) 0.40 - 1.24 mg/dL Final   88/84/7974 8741 3.69 (H) 0.40 - 1.24 mg/dL Final     Blood Urea Nitrogen   Date/Time Value Ref Range Status   08/27/2024 0230 49 (H) 7 - 25 mg/dL Final     Estimated CrCl: 39.7 mL/min    Intake/Output Summary (Last 24 hours) at 08/27/2024 1423  Last data filed at 08/27/2024 1300  Gross per 24 hour   Intake 2328.03 ml   Output 2025 ml   Net 303.03 ml      Actual Weight:  144.2 kg (317 lb 14.5 oz)

## 2024-08-27 NOTE — Case Mgmt DC Plan [600024]
 Case Management Admission Assessment    NAME:Matthew Paul                          MRN: [id number]             DOB:10-Jun-1966          AGE: 58 y.o.  ADMISSION DATE: 08/24/2024             DAYS ADMITTED: LOS: 2 days      Today?s Date: 08/27/2024    Source of Information: This CM met with SO for assessment on this date.  Provided contact information and explanation of SW/NCM roles.    Provided opportunity for questions and discussion.   Plan  Plan: Case Management Assessment, Assist PRN with SW/NCM Services  Patient lives with SO. They have been together for 25 years. Patient has 2 adult children in [place] .  Independent.   No previous HH or placements.   Has commercial insurance through his work. No concerns or issues with prescription coverage. SO can provide transportation at discharge.     Patient Address/Phone confirmed  [address]  [place] [address]  [phone] (home)     Emergency Contact  Extended Emergency Contact Information  Primary Emergency Contact: Hunter,[name]   [place]   Home Phone: [phone]  Relation: Significant Other  Secondary Emergency Contact: Kauer,Joy   [place]   Home Phone: [phone]  Relation: Sister    Network Engineer Directive: No, patient does not have a healthcare directive  Would patient like to fill out a (a new) Editor, Commissioning?: No, patient declined  Lawyer (Psych unit only): No, patient does not have a Health Visitor  Does the Patient Need Case Management to Arrange Discharge Transport? (ex: facility, ambulance, wheelchair/stretcher, Medicaid, cab, other): No  Will the Patient Use Family Transport?: Yes  Transportation Name, Phone and Availability #1: [name]  [phone]    Expected Discharge Date  08/30/2024     Living Situation Prior to Admission  Living Arrangements  Type of Residence: Home, independent  Living Arrangements: Spouse/significant other  Does residence have entry and/or inside stairs?: No  Assistance needed prior to admit or anticipated on discharge: No  Can support system provide 24/7 care if needed?: Maybe  Level of Function   Prior level of function: Independent  Cognitive Abilities   Cognitive Abilities: Continue to Assess    Financial Resources  Coverage  Primary Insurance: Nurse, learning disability  Secondary Insurance: No insurance  Medication Coverage    Medication Coverage: Nurse, learning disability  Are current medications affordable?: Yes  Do You Manage Your Own Medications?: Yes  Source of Income   Source Of Income: Employed  Financial Assistance Needed?  na    Psychosocial Needs  Mental Health  Mental Health History: No  Substance Use History   Other  na    Current/Previous Services  PCP  Georgina Mulch, [phone], [phone] confirmed  Pharmacy    [facility] [phone] - [place], [place] - [address] [place] [address]  [address] [place] [address]  [place] [address]  Phone: [phone] Fax: [phone]    [facility] - [place], [place] - [address]  [address], [address]  [place] [address]  Phone: [phone] Fax: [phone]    [facility] (Preferred) - [place], [place] - [address]  [address]  [place] [address]  Phone: [phone] Fax: [phone]    Durable Medical Equipment   Durable Medical Equipment at home: None  Home Health  Receiving home health: No  Hemodialysis or Peritoneal Dialysis  Undergoing hemodialysis or peritoneal dialysis: No  Tube/Enteral Feeds  Receive tube/enteral feeds: No  Infusion  Receive infusions: No  Private Duty  Private duty help used: No  Home and Community Based Services  Home and community based services: No  [employer]   Hospice  Hospice: No  Outpatient Therapy   Skilled Nursing Facility/Nursing Home  SNF: No  NH: No  Inpatient Rehab  IPR: No  [facility] Acute [facility] Hospital  LTACH: No  Acute Hospital Stay  Acute Hospital Stay: In the past  Was patient's stay within the last 30 days?: No      Katheryn Oram, RN MSN ONC  Nurse Case Financial Risk Analyst

## 2024-08-28 ENCOUNTER — Encounter: Admit: 2024-08-28 | Discharge: 2024-08-28 | Payer: BLUE CROSS/BLUE SHIELD

## 2024-08-28 ENCOUNTER — Inpatient Hospital Stay: Admit: 2024-08-28 | Discharge: 2024-08-28 | Payer: BLUE CROSS/BLUE SHIELD

## 2024-08-28 VITALS — BP 104/60 | HR 112 | Temp 97.50000°F | Ht 79.0 in | Wt 313.3 lb

## 2024-08-28 LAB — BASIC METABOLIC PANEL
~~LOC~~ BKR ANION GAP: 11 mL/min — ABNORMAL LOW (ref 3–12)
~~LOC~~ BKR BLD UREA NITROGEN: 58 mg/dL — ABNORMAL HIGH (ref 7–25)
~~LOC~~ BKR CALCIUM: 9.2 mg/dL (ref 8.5–10.6)
~~LOC~~ BKR CHLORIDE: 100 mmol/L — ABNORMAL HIGH (ref 98–110)
~~LOC~~ BKR CO2: 21 mmol/L — ABNORMAL HIGH (ref 21–30)
~~LOC~~ BKR CREATININE: 3.4 mg/dL — ABNORMAL HIGH (ref 0.40–1.24)
~~LOC~~ BKR GLOMERULAR FILTRATION RATE (GFR): 20 mL/min — ABNORMAL LOW (ref >60)
~~LOC~~ BKR GLUCOSE, RANDOM: 146 mg/dL — ABNORMAL HIGH (ref 70–100)
~~LOC~~ BKR POTASSIUM: 4.7 mmol/L — ABNORMAL HIGH (ref 3.5–5.1)
~~LOC~~ BKR SODIUM, SERUM: 132 mmol/L — ABNORMAL LOW (ref 137–147)

## 2024-08-28 LAB — COMPREHENSIVE METABOLIC PANEL
~~LOC~~ BKR ALBUMIN: 3.1 g/dL — ABNORMAL LOW (ref 3.5–5.0)
~~LOC~~ BKR ALK PHOSPHATASE: 56 U/L — ABNORMAL LOW (ref 25–110)
~~LOC~~ BKR ANION GAP: 11 mg/dL — ABNORMAL HIGH (ref 3–12)
~~LOC~~ BKR AST: 19 U/L — ABNORMAL LOW (ref 7–40)
~~LOC~~ BKR CALCIUM: 9 mg/dL — ABNORMAL LOW (ref 8.5–10.6)
~~LOC~~ BKR GLOMERULAR FILTRATION RATE (GFR): 23 mL/min — ABNORMAL LOW (ref >60–1.00)
~~LOC~~ BKR TOTAL BILIRUBIN: 0.8 mg/dL — ABNORMAL HIGH (ref 0.2–1.3)
~~LOC~~ BKR TOTAL PROTEIN: 5.6 g/dL — ABNORMAL LOW (ref 6.0–8.0)

## 2024-08-28 LAB — CBC
~~LOC~~ BKR MPV: 8.7 fL — ABNORMAL LOW (ref 7.0–11.0)
~~LOC~~ BKR PLATELET COUNT: 143 10*3/uL — ABNORMAL LOW (ref 150–400)

## 2024-08-28 LAB — ECG 12-LEAD
P AXIS: 29 degrees
P-R INTERVAL: 142 ms
Q-T INTERVAL: 292 ms
QRS DURATION: 116 ms
QTC CALCULATION (BAZETT): 398 ms
R AXIS: 214 degrees
T AXIS: 13 degrees
VENTRICULAR RATE: 112 {beats}/min

## 2024-08-28 LAB — POC GLUCOSE
~~LOC~~ BKR POC GLUCOSE: 153 mg/dL — ABNORMAL HIGH (ref 70–100)
~~LOC~~ BKR POC GLUCOSE: 131 mg/dL — ABNORMAL HIGH (ref 70–100)
~~LOC~~ BKR POC GLUCOSE: 133 mg/dL — ABNORMAL HIGH (ref 70–100)
~~LOC~~ BKR POC GLUCOSE: 140 mg/dL — ABNORMAL HIGH (ref 70–100)
~~LOC~~ BKR POC GLUCOSE: 161 mg/dL — ABNORMAL HIGH (ref 70–100)

## 2024-08-28 LAB — CULTURE-URINE W/SENSITIVITY

## 2024-08-28 LAB — HEPARIN ASSAY (UNFRACTIONATED)
~~LOC~~ BKR HEPARIN ASSAY: 0.3 [IU]/mL (ref 0.30–0.70)
~~LOC~~ BKR HEPARIN ASSAY: 0.3 [IU]/mL (ref 0.30–0.70)

## 2024-08-28 MED ORDER — FUROSEMIDE 10 MG/ML IJ SOLN
40 mg | Freq: Once | INTRAVENOUS | 0 refills | Status: CP
Start: 2024-08-28 — End: ?
  Administered 2024-08-28: 16:00:00 40 mg via INTRAVENOUS

## 2024-08-28 MED ORDER — TUBE FEED (ENAR)
0 refills | Status: DC
Start: 2024-08-28 — End: 2024-08-30

## 2024-08-28 NOTE — Progress Notes [1]
 Chaplain Note:    Admit Date: 08/24/2024     Pt. On the vent. No family present. Chaplain provided ministry of presence and silent prayers. Chaplain remains available as needed.    The On-Call Chaplain is available on Voalte or can be paged via the switchboard [phone]) for urgent and emergent needs.   The Spiritual Care team responds to other requests within 24-hours when submitted as a Chaplain Consult in O2.           Date/Time:                      User:                                      08/28/2024 8:59 AM Morene Eth D.MIN, [facility]

## 2024-08-28 NOTE — Care Plan [600008]
 Problem: Discharge Planning  Goal: Participation in plan of care  Outcome: Goal Ongoing  Goal: Knowledge regarding plan of care  Outcome: Goal Ongoing  Goal: Prepared for discharge  Outcome: Goal Ongoing     Problem: Skin Integrity  Goal: Skin integrity intact  Outcome: Goal Ongoing  Goal: Healing of skin (Wound & Incision)  Outcome: Goal Ongoing  Goal: Healing of skin (Pressure Injury)  Outcome: Goal Ongoing     Problem: Injury-Risk of, Non-Violent Physical Restraints  Goal: Absence of Injury while physically restrained (Non-Violent)  Outcome: Goal Ongoing     Problem: Glucose Management  Goal: Absence of hyperglycemia  Outcome: Goal Ongoing  Goal: Absence of Hypoglycemia  Outcome: Goal Ongoing  Goal: Glucose level within specified parameters  Outcome: Goal Ongoing

## 2024-08-28 NOTE — Progress Notes [1]
 2010 Notified Belvie Bison, MD of pt no longer following commands, no cough, no gag and no corneal reflexes present. This RN tried to see if pt withdraws from pain but he did not. Jessica, RT also tried to get a cough or gag without success. This RN called the charge RN Hilbert to compare assessments and she agreed with my assessment. Propofol  was paused to see if pt reflexes and commands return and it did at 2050.     0534 Notified Belvie Bison, MD of Radial artline gone bad and no longer drawing back or flushing.    9459 Belvie Bison, MD at bedside. Ok to remove artline and do cuff pressures.

## 2024-08-28 NOTE — Progress Notes [1]
 MEDICAL INTENSIVE CARE UNIT  PROGRESS NOTE       Patient's Name:  Matthew Paul MRN: [id number]   Patient's DOB: 09/10/1966   Today's Date:  08/28/2024  Admission Date: 08/24/2024  LOS: 3 days    Problem list  Principal Problem:    Acquired bilateral renal cysts  Active Problems:    ADPKD (autosomal dominant polycystic kidney disease)    Cardiac arrest (CMS-HCC)    Acute kidney injury superimposed on CKD    Obstructive cardiovascular shock (CMS-HCC)    Lactic acidosis    High anion gap metabolic acidosis    Acute pulmonary embolism with acute cor pulmonale (CMS-HCC)    Acute respiratory failure with hypoxia (CMS-HCC)      HOSPITAL COURSE SUMMARY     Matthew Paul is a 58 y.o. 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  Patient admitted initially on 11/14 for planned left robotic renal cyst decortication and cystoscopy with ureteral stent placement.  No complications during procedure and patient had existing JP drain placed.  Unfortunately, patient suffered PEA cardiac arrest on 11/15 with ROSC following 2 rounds of epinephrine .    Patient was transported to the ICU on 11/15 and initially required high vent settings [PEEP 10, FiO2 100%] and pressor support with NE and vaso.  Cardiology consulted for abnormal EKG with new RBBB.  Mild metabolic derangements on initial labs without electrolyte disturbances.  Formal TTE stat with EF 55%, grade 1 diastolic dysfunction, and moderate to severely dilated LV with PASP 34.  CTA chest 11/15 showing massive bilateral PE, right greater than left with significant RV strain.  CT AP without evidence of hemorrhage or acute abdominal pathology.  After much discussion with family regarding risk/benefit, started on therapeutic heparin  drip 11/15.     Interval update 08/28/2024:  > Continue ventilation and sedation with prop and fent, wean as able.    > Hold of on RT trials as still having high ventilation requirements with PEEP 8 and FiO2 70%  > Continue heparin  gtt  > Vaso stopped overnight, continue Levo for pressure support, maintain goal MAP >=65  > Continue vanc and zosyn  ; IV hydrocortisone  50 mg q6h  > Follow up cultures  > IV lasix  40 mg x1 today, decrease free water  bolus from 250 to 125 ml q6h, goal net neg 1L, repeat BMP and consider redose lasix  this afternoon  > Bronchoscopy this afternoon to assess ET tube placement    ASSESSMENT & PLAN     NEURO/PSYCH  #Sedated   - Initially post-code: moving all extremities, opening eyes, attempting to pull at tube prior to sedation   PLAN:  > Sedation w/ prop & fent, wean as able   > Triglycerides 100, continue to trend and may consider switching from propofol  to precedex  if triglycerides increase > 300  > No concern for mentation at this time as requiring high levels of sedation, defer CTH.       -----------------------------------------------------------------------------------------------------------------------------------------------------------------------------------------------------------------------  PULMONARY  #Mechanical intubation for airway protection   #Bilateral massive PE  - Intubated on 11/15 post PEA arrest. ETT advanced to 28 at the teeth   - Bedside POCUS concerning for RV overload and McConnell's sign.   - TTE: RV overload further c/f PE  - CTA Chest: massive multiple BL PE, R>L. Elevated RV:LV c/w right heart strain w/ dilation of RA,  RV, main PA  - Result of long discussion with family, pharmacy, interventional radiology, regarding overall multidisciplinary approach and decision for tPA versus heparin  was to initiate heparin .  Discussed risks/benefit with family at length especially given his recent surgical procedure yesterday and existing JP drain, along with potential for existing cerebral aneurysms that are often associated with autosomal dominant PKD.  They understand this and would like to continue forward with heparin  GGT.    PLAN:  >Invasive Mode: V/AC+  Set VT (mL):  [550 milliliters]   Expired VT Spontaneous (mL):  [0 milliliters]   Expiratory VT (mL):  [362 mL]   Set RR:  [20 breaths/minutes]   Total Respiratory Rate:  [26 breaths/minutes]   Minute Volume (Lpm):  [11.9 liters/minutes]   %MVspontaneous:  [1 %]   PIP Actual (cmH2O):  [8.4 cm H20]   PEEP/CPAP (cmH2O):  [8 cm H2O]   Intrinsic PEEP (cmH2O):  [7 cmH2O]   Mean Airway Pressure (cmH2O):  [11 cm H2O]   > Repeat ABG w/ vent changes   > Frequent neurovascular checks  > If becomes hemodynamically unstable or repeat cardiac arrest, will start tPA vs ECMO  > Continue heparin  gtt - monitor Xa  > Chest physiotherapy and PD&V  > Hold of on RT trials as still having high ventilation requirements with PEEP 8 and FiO2 70%  > Diuresis for volume overload as below in cardiology section  > Consider bronchoscopy today to assess ET tube placement    #Suspected left lower lobe pneumonia  - MRSA nares +  - 11/17 CXR: Increasing left lower lobe consolidation which could be infection, aspiration, or worsening atelectasis.   PLAN:  > Continue vancomycin  and zosyn  (11/17-) to treat pneumonia empirically due to fever and potential pneumonia on CXR   > MRSA nares +, continue vanc   > Follow cultures    > Sputum GS with 10-25/LPF neutrophils, many mixed bacteria  > Continue hydrocortisone  50 mg IV q6h  > Chest physiotherapy and PD&V    -----------------------------------------------------------------------------------------------------------------------------------------------------------------------------------------------------------------------  CARDIOVASCULAR  #PEA arrest   #Shock - obstructive 2/2 PE   #Remote history of Afib (1980s) not on AC   #Volume Overload  - PEA arrest on 11/15, ROSC after 2 rounds of epi and total code lasting 4-6 minutes.  - PTA: ASA 81 mg daily  - EKG:               Previous OSH 2020: SR, Qtc 406               EKG (11/15 post code): Sinus tachy 100s, Qtc 452, new RBBB  - Pressors: NE, vaso   - Echo (11/15): EF 55%. No dia dysfunction. Mod to severely enlarged RV w/ intraventricular septal flattening. PASP 35 + CVP. No major valvular abnormalities.   - CTA Chest: massive bl PE with RV strain.   - CT AP: normal post-surgical changes.   - Trop: 138   PLAN:  > PEA arrest due to obstructive shock from massive bilateral PE.  He has a new right bundle branch block, likely due to this.  Discussed with interventional radiology, not amenable to thrombectomy at this time due to location.    > Remains on norepi for pressor support ; vasopressin  was stopped on 11/17 PM   > Titrate to goal MAP > 65   > Would restart vasopressin  if requiring norepi > 0.2  > Likely volume overloaded based on diffuse edema on exam and net + 1.3 in past  24 hours   > Give lasix  40 mg IV   > Goal net neg 1L, monitor I/O and consider redose this afternoon   > Repeat BMP this afternoon   > Decrease free water  from 250 to 125 ml q6h    #HLD   - PTA: atorvastatin  20 mg   - Lipid profile: chol 124, TG 208, HDL 33, LDL 82  PLAN:  > HOLD atorvastatin  given LFTs     #HTN  - PTA meds: HCTZ 25 mg qAM, lisinopril  20 mg daily  PLAN:  > HOLD HCTZ and lisinopril  - currently in multipressor shock     -----------------------------------------------------------------------------------------------------------------------------------------------------------------------------------------------------------------------  FEN/GI  #Elevated LFTs, likely post-code, resolved  #GERD  #Constipation  - Post code LFTs AST 157 (13 on admit), ALT 163 (12 on admit). Tbili 0.4.  - PTA meds: omeprazole 40 mg daily   - Last bowel movement PTA  PLAN:  > Hold atorvastatin    > Daily CMP   > Continue tube feeds, at goal rate   > Decrease free water  from 250 to 1ml q6h  > Continue PPI daily  > Continue scheduled miralax  BID and senna daily    -----------------------------------------------------------------------------------------------------------------------------------------------------------------------------------------------------------------------  RENAL  #ADPKD  #AKI on CKD   #HAGMA, mild likely 2/2 above further renal dysfunction  - PTA meds: jynarque  (valptan) 60 mg qAM / 30 mg qPM  - Baseline Cr 2.5 - 2.8   -11/14 underwent left robotic renal cyst decortication, cystoscopy, L ureteral stent placement for enlarging bilateral renal size causing pressure and discomfort in the setting of ADPKD.  Total of roughly 100 cysts were decorticated with 19 large cysts sent for pathology.  Placement of left ureteral stent, confirmation prior to surgical completion that left ureter had no urine leak.  No overt post surgical complications.  - CT AP (11/15): normal post-operative changes  Recent Labs     08/26/24  0313 08/27/24  0230 08/28/24  0425   CR 3.32* 3.23* 3.00*   BUN 49* 49* 51*   GFR 21* 21* 23*     - Net IO Since Admission: 5,967.33 mL [08/28/24 0953]  -   Intake/Output Summary (Last 24 hours) at 08/28/2024 0953  Last data filed at 08/28/2024 0800  Gross per 24 hour   Intake 3843.28 ml   Output 2600 ml   Net 1243.28 ml   PLAN:  > JP drain in place with ~ 340 mL out in the last 24 hours, per urology suspected/usual amount of fluid drainage.  > Maintain foley catheter  > AKI stable, continue strict I's and O's, avoid further nephrotoxic agents, renally dose medications as able.      -----------------------------------------------------------------------------------------------------------------------------------------------------------------------------------------------------------------------  ENDOCRINE  #No acute concerns   - Invalid input(s): GLUCOSE  Recent Labs     08/26/24  1753 08/26/24  2323 08/27/24  0528 08/27/24  1131 08/27/24  1750 08/27/24  2212 08/28/24  0100 08/28/24  0554   GLUPOC 145* 120* 148* 134* 149* 153* 161* 133* PLAN:  > CTM  > LDCF      -----------------------------------------------------------------------------------------------------------------------------------------------------------------------------------------------------------------------  HEME/ONC  #DVT/PE  #Normocytic Anemia  #Thrombocytopenia  - 11/16 doppler BLE: acute appearing nonocclusive thrombus in L popliteal vein  Recent Labs     08/26/24  0313 08/27/24  0230 08/28/24  0425   HGB 14.3 13.4* 12.0*   MCV 95.6 93.7 93.6   PLTCT 175 163 143*   PLAN:  > Monitor HGB, Transfuse if Hgb <7 and PLT < 10   > DVT Ppx:  Has been on heparin  prior to procedure on initial admission   > Continue heparin  gtt - monitoring with Xa   > No evidence of acute bleeding. Ongoing monitor for s/s of bleeding.   > If significant HD instability: obtain TEG, CBC STAT and if dropping hgb consider fast scan for eval of retroperitoneal bleed/hematoma. At this time, no concerns for such.     #Leukocytosis, likely reactive   - Initial leukocytosis downtrended, consistent with reactive leukocytosis due to surgery/cardiac arrest, until elevation on 11/18  PLAN:   > Likely due to initiation of steroids on 11/17, continue to monitor with daily CBC  -----------------------------------------------------------------------------------------------------------------------------------------------------------------------------------------------------------------------  ID  #Febrile  #Suspected Left Lower Lobe Pneumonia  - Last febrile temperature at 2000 on 11/17  - MRSA nares +  - 11/17 CXR: Increasing left lower lobe consolidation which could be infection, aspiration, or worsening atelectasis.   Recent Labs     08/26/24  0313 08/27/24  0230 08/28/24  0425   WBC 14.10* 11.40* 18.80*     - Temp (24hrs), Avg:37.8 ?C (100 ?F), Min:36.9 ?C (98.4 ?F), Max:38.6 ?C (101.5 ?F)      Culture Data Date collected Result   Blood cx 11/17 NG x 24h   Sputum cx 11/17 pending   Urine cx 11/17 NG x 24h Antimicrobial First dose Last dose Comment   Ancef  11/14 11/14 X2, for perioperative abx   Vancomycin  11/17     Zosyn  11/17       PLAN:  > Follow cultures as above  > Continue zosyn  and vancomycin  to treat pneumonia empirically due to fever and possible pneumonia on CXR   > MRSA nares +, continue vanc  > Continue hydrocortisone  50 mg IV q6h  > Chest physiotherapy and PD&V    -----------------------------------------------------------------------------------------------------------------------------------------------------------------------------------------------------------------------  MSK/DERM  #Gout   PLAN:  > Hold PTA allopurinol , colchicine  0.6 mg prn for flares     -----------------------------------------------------------------------------------------------------------------------------------------------------------------------------------------------------------------------  LDA/PROPHYLAXIS  Patient Lines/Drains/Airways Status       Active Lines:       Name Placement date Placement time Site Days    CVC Triple Femoral, right 08/25/24  1326  -- 3    Surgical Drain Bulb (e.g. JP) Left Abdomen #1 08/24/24  1733  -- 4    Temporary GI Tube Orogastric 08/26/24  1200  -- 2    Indwelling Urinary Catheter 16 FR Standard 2-way 08/27/24  1100  -- 1    Peripheral IV 08/24/24 1101 Left Distal;Posterior Forearm 20 G 08/24/24  1101  -- 4                    Lines: PIV x2, CVC R femoral (11/15 -)  Tubes: JP drain L abd (11/14 -), ETT (11/15 -)  Urinary Catheter:  Yes (11/15 -)  VTE ppx: SCDs, heparin  gtt  GI:  PPI  Bowel regimen: miralax  and senna  Diet: Regular Diet  DIET NPO Strict  Code status: Full Code     Patient case discussed with attending physician, Dr. Alan LITTIE Constable, MD     Ludie Overly, MD  Internal Medicine PGY-1   Available on Voalte    SUBJECTIVE     Overnight, PEEP was increased from 5 to 8 to improve oxygenation. Vasopressin  was stopped at 2300. On one nursing assessment he was found to be without reflexes and not withdrawing to pain, this resolved with decreased sedation. His arterial line went bad around 0530 this morning.      Patient remains  intubated, sedated, on mechanical ventilation.     Past Medical History:    Acid reflux    ADPKD (autosomal dominant polycystic kidney)    Apnea    Gout    Hyperlipidemia    Hypertension    Mild shortness of breath    Paroxysmal A-fib (CMS-HCC)     Surgical History:   Procedure Laterality Date    HX HERNIA REPAIR Right 1983    ROBOTIC LAPAROSCOPIC ABLATION RENAL CYSTS Right 06/08/2019    Performed by Erskin Lenis, MD at [facility] OR    ROBOT ASSISTED SURGERY N/A 06/08/2019    Performed by Erskin Lenis, MD at [facility] OR    CYSTOURETHROSCOPY WITH INDWELLING URETERAL STENT INSERTION Right 06/08/2019    Performed by Erskin Lenis, MD at [facility] [place]    ROBOTIC ASSISTED LAPAROSCOPIC RENAL CYST DECORTICATION Left 10/02/2019    Performed by Erskin Lenis, MD at [facility] OR    CYSTOURETHROSCOPY WITH INDWELLING URETERAL STENT INSERTION Left 10/02/2019    Performed by Erskin Lenis, MD at [facility] OR    ABLATION, CYST, KIDNEY, LAPAROSCOPIC Left 08/24/2024    Performed by Erskin Lenis LABOR, MD at [facility] OR    ROBOT ASSISTED SURGERY Left 08/24/2024    Performed by Erskin Lenis LABOR, MD at [facility] OR    CYSTOURETHROSCOPY, WITH INDWELLING URETERAL STENT INSERTION Left 08/24/2024    Performed by Erskin Lenis LABOR, MD at [facility] OR    HX TONSILLECTOMY      KIDNEY CYST REMOVAL      Multiple    KNEE CARTILAGE SURGERY Bilateral      No family history on file.  Social History[1]  Vaping/E-liquid Use    Vaping Use Never User      Vaping/E-liquid Substances    CBD No     Nicotine No     Other No     Flavored No     THC No     Unknown No             Current Medications[2]       OBJECTIVE                          Vital Signs: Last Filed                 Vital Signs: 24 Hour Range   BP: 116/65 (11/18 0845)  Temp: 36.9 ?C (98.4 ?F) (11/18 0800)  Pulse: 99 (11/18 0845)  Respirations: 18 PER MINUTE (11/18 0845)  SpO2: 95 % (11/18 0845)  O2%: 70 % (11/18 0845)  O2 Device: Ventilator (11/18 0700) BP: (90-117)/(58-65)   ABP: (60-140)/(46-75)   Temp:  [36.9 ?C (98.4 ?F)-38.6 ?C (101.5 ?F)]   Pulse:  [93-115]   Respirations:  [9 PER MINUTE-25 PER MINUTE]   SpO2:  [88 %-97 %]   O2%:  [55 %-90 %]   O2 Device: Ventilator     Vitals:    08/25/24 1227 08/25/24 1509 08/26/24 1000   Weight: (!) 144.5 kg (318 lb 9 oz) (!) 144.2 kg (318 lb) (!) 144.2 kg (317 lb 14.5 oz)         Artificial Airway   Endotracheal Tube       Ventilator/Respiratory Therapy  Yes: Invasive Mode: V/AC+  Set VT (mL):  [550 milliliters]   Expired VT Spontaneous (mL):  [0 milliliters]   Expiratory VT (mL):  [362 mL]   Set RR:  [20 breaths/minutes]   Total Respiratory Rate:  [26  breaths/minutes]   Minute Volume (Lpm):  [11.9 liters/minutes]   %MVspontaneous:  [1 %]   PIP Actual (cmH2O):  [8.4 cm H20]   PEEP/CPAP (cmH2O):  [8 cm H2O]   Intrinsic PEEP (cmH2O):  [7 cmH2O]   Mean Airway Pressure (cmH2O):  [11 cm H2O]      Vent Weaning   Per protocol    Physical Exam  Constitutional: 58 y.o. male intubated and sedated  Head: Normocephalic, atraumatic  Cardiovascular: Regular rhythm, tachycardic rate  Pulmonary: Clear to auscultation bilaterally, no wheezes or rales  GI: Abdomen soft, non-tender, non-distended (baseline habitus), bowel sounds +  Skin: No rashes or bruises, good turgor, cap refill <2s  Neuro:  Moves all extremities, follows commands  Musculoskeletal: No deformities  Lymphatic/extremities: Diffuse mild non-pitting edema    Laboratory:  Recent Labs     08/25/24  1258 08/26/24  0313 08/27/24  0230 08/28/24  0425   NA 137 130* 130* 132*   K 4.9 4.9 4.3 4.5   CL 105 102 100 102   CO2 17* 15* 19* 19*   GAP 15* 13* 11 11   BUN 50* 49* 49* 51*   CR 3.69* 3.32* 3.23* 3.00*   GLU 207* 219* 147* 152*   CA 9.9 9.5 9.5 9.0   ALBUMIN  3.8 3.5 3.3* 3.1*   MG 2.0 2.2 1.9 2.0       Recent Labs     08/25/24  1258 08/25/24  1825 08/26/24  0028 08/26/24  0149 08/26/24  0313 08/26/24  0825 08/26/24  1113 08/26/24  1749 08/27/24  0230 08/28/24  0425   WBC 17.70*  --   --   --  14.10*  --   --   --  11.40* 18.80*   HGB 14.0  --   --   --  14.3  --   --   --  13.4* 12.0*   HCT 43.3  --   --   --  43.0  --   --   --  40.2 36.7*   PLTCT 204  --   --   --  175  --   --   --  163 143*   PTT  --  27.2 >200.0 >200.0  --  182.2* 62.3* 99.4*  --   --    AST 157*  --   --   --  79*  --   --   --  30 19   ALT 163*  --   --   --  64*  --   --   --  17 3*   ALKPHOS 67  --   --   --  62  --   --   --  61 56      Estimated Creatinine Clearance: 42.7 mL/min (A) (by C-G formula based on SCr of 3 mg/dL (H)).  Vitals:    08/25/24 1227 08/25/24 1509 08/26/24 1000   Weight: (!) 144.5 kg (318 lb 9 oz) (!) 144.2 kg (318 lb) (!) 144.2 kg (317 lb 14.5 oz)      Recent Labs     08/27/24  1431 08/28/24  0425   PHART 7.30* 7.29*   PO2ART 63* 115*         Pertinent radiology reviewed.    CHEST SINGLE VIEW   Final Result         Endotracheal tube with the tip approximately 8 cm above the carina. This could be slightly advanced for more  optimal positioning.      Gastric tube courses below the diaphragm and out of the field-of-view.      Increasing left lower lobe consolidation which could be infection, aspiration, or worsening atelectasis.          Finalized by Eleanor Don, M.D. on 08/27/2024 12:25 PM. Dictated by Eleanor Don, M.D. on 08/27/2024 12:23 PM.      FEEDING TUBE PLCMNT (ABD/CHEST LMTD)   Final Result         Gastric tube with sidehole projecting over the body of the stomach and tip projecting at the gastric antrum.       The stomach is mildly distended.       Patchy bibasilar opacities are better demonstrated on recent CT chest.      By my electronic signature, I attest that I have personally reviewed the images for this examination and formulated the interpretations and opinions expressed in this report          Finalized by Christobal SHAUNNA Sawyer, MD on 08/26/2024 1:35 PM. Dictated by Mont Lay, MD on 08/26/2024 1:28 PM.      US  VENOUS DOPPLER BILATERAL   Final Result         1.  Acute appearing nonocclusive thrombus in the left popliteal vein.      2.  No femoral/popliteal deep venous thrombosis in right lower extremity.      By my electronic signature, I attest that I have personally reviewed the images for this examination and formulated the interpretations and opinions expressed in this report          Finalized by Allean Pouch, M.D. on 08/26/2024 11:24 AM. Dictated by Mont Lay, MD on 08/26/2024 11:14 AM.      CTA CHEST PULM EMBOLISM W/CONT   Final Result         CTA CHEST:      1. Multiple bilateral pulmonary emboli as described above, right greater than left. Elevated RV: LV ratio consistent with right heart strain, with dilatation of the right atrium, right ventricle and main pulmonary artery. Borderline cardiomegaly.   2. Moderate left lower lobe with additional areas of mild bilateral atelectasis, without significant pulmonary infarct or pleural effusion.      CT abdomen and pelvis:      1. Postsurgical changes of reported recent left robotic renal cyst decortication, with left perinephric stranding and multifocal gas. Small amount of free air is noted and likely postoperative. Surgical drain extending to the left lateral perinephric region, with no abdominal or pelvic fluid collection.   2. Redemonstration of marked bilateral renal enlargement with innumerable cysts, consistent with autosomal dominant polycystic kidney disease. 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   3. Bladder is decompressed about a Foley catheter, limiting evaluation.   4. Colonic diverticulosis without acute diverticulitis or bowel obstruction.   5. Trace pelvic free fluid, nonspecific though possibly postoperative.      Major preliminary findings including presence of extensive pulmonary emboli with right heart strain was conveyed to Dr. Iannazzo by Dairl messenger at 4:34 PM on 08/25/2024.          Finalized by Norleen Pane, M.D. on 08/25/2024 4:35 PM. Dictated by Norleen Pane, M.D. on 08/25/2024 4:12 PM.      CT ABD/PELV W CONTRAST   Final Result         CTA CHEST:      1. Multiple bilateral pulmonary emboli as described above, right greater than  left. Elevated RV: LV ratio consistent with right heart strain, with dilatation of the right atrium, right ventricle and main pulmonary artery. Borderline cardiomegaly.   2. Moderate left lower lobe with additional areas of mild bilateral atelectasis, without significant pulmonary infarct or pleural effusion.      CT abdomen and pelvis:      1. Postsurgical changes of reported recent left robotic renal cyst decortication, with left perinephric stranding and multifocal gas. Small amount of free air is noted and likely postoperative. Surgical drain extending to the left lateral perinephric region, with no abdominal or pelvic fluid collection.   2. Redemonstration of marked bilateral renal enlargement with innumerable cysts, consistent with autosomal dominant polycystic kidney disease. 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   3. Bladder is decompressed about a Foley catheter, limiting evaluation.   4. Colonic diverticulosis without acute diverticulitis or bowel obstruction.   5. Trace pelvic free fluid, nonspecific though possibly postoperative.      Major preliminary findings including presence of extensive pulmonary emboli with right heart strain was conveyed to Dr. Iannazzo by Dairl messenger at 4:34 PM on 08/25/2024.          Finalized by Norleen Pane, M.D. on 08/25/2024 4:35 PM. Dictated by Norleen Pane, M.D. on 08/25/2024 4:12 PM.      2D + DOPPLER ECHO   Final Result      CHEST SINGLE VIEW   Final Result   FINDINGS/IMPRESSION:        Support Devices: Endotracheal tube has been slightly advanced, with the tip now projecting 3 cm above the carina. This could be slightly advanced for optimal positioning.      Lungs/Pleura: Inferior aspects of both lungs, particularly the left, are excluded from the field-of-view,. Patchy bibasilar opacities, likely atelectasis. No pneumothorax.      Heart and Mediastinum: The cardiomediastinal silhouette is stable.             Finalized by JOSETTE BEAGLE, M.D. on 08/25/2024 12:48 PM. Dictated by JOSETTE BEAGLE, M.D. on 08/25/2024 12:46 PM.      CHEST SINGLE VIEW   Final Result   FINDINGS/IMPRESSION:        Support Devices: Endotracheal tube in place, with the tip projecting 0.5 cm above the carina. Advancement is recommended..      Lungs/Pleura: Inferior aspect of the left lung, and inferior right costophrenic angle are excluded from the film.SABRA Patchy bibasilar opacities, likely atelectasis. Mild pulmonary edema.. No pneumothorax.SABRA      Heart and Mediastinum: Cardiac silhouette is incompletely visualized, although likely within normal limits.          Finalized by JOSETTE BEAGLE, M.D. on 08/25/2024 12:49 PM. Dictated by JOSETTE BEAGLE, M.D. on 08/25/2024 12:48 PM.      US  EXTREMITY LIMITED RIGHT    (Results Pending)   FEEDING TUBE PLCMNT (ABD/CHEST LMTD)    (Results Pending)        Scheduled Meds:[Held by Provider] allopurinoL  (ZYLOPRIM ) tablet 100 mg, 100 mg, Oral, QDAY  [Held by Provider] atorvastatin  (LIPITOR) tablet 10 mg, 10 mg, Oral, QDAY  bacitracin  zinc  topical ointment, , Topical, BID  furosemide  (LASIX ) injection 40 mg, 40 mg, Intravenous, ONCE  [Held by Provider] hydroCHLOROthiazide  (HYDRODIURIL ) tablet 25 mg, 25 mg, Oral, QAM8  hydrocortisone  sod succ (PF) (Solu-CORTEF ) injection 50 mg, 50 mg, Intravenous, Q6H*  insulin  aspart (U-100) (NOVOLOG  FLEXPEN U-100 INSULIN ) injection PEN 0-6 Units, 0-6 Units, Subcutaneous, Q6H  pantoprazole  (PROTONIX ) injection 40 mg, 40 mg,  Intravenous, QDAY  petrolatum  (STYE) ophthalmic ointment 0.25 inch, 0.25 inch, Both Eyes, Q6H  piperacillin /tazobactam (ZOSYN ) 4.5 g in sodium chloride  0.9% (NS) 100 mL IVPB (MB+)(EXTENDED INFUSION), 4.5 g, Intravenous, Q6H*  polyethylene glycol 3350  (MIRALAX ) packet 17 g, 1 packet, Feeding Tube, BID  Protein Supplement Packets, , SEE ADMIN INSTRUCTIONS, TID  sennosides-docusate sodium  (SENOKOT-S) tablet 1 tablet, 1 tablet, Per OG Tube, QDAY  vancomycin , random dosing, 1 each, Intravenous, Random Dosing    Continuous Infusions:   Diet Critical Care Enteral Feeding Volume Based Infusion      fentaNYL  (SUBLIMAZE ) 1000 mcg/100 mL NS IV drip (std conc)(premade) 35 mcg/hr (08/27/24 2011)    heparin  (porcine) 25,000 units in dextrose  5% (D5W) 250 mL IV infusion (std conc) 1,500 Units/hr (08/28/24 0718)    norepinephrine  (LEVOPHED ) 16 mg in dextrose  5% (D5W) 250 mL IV drip (quad conc) 0.17 mcg/kg/min (08/28/24 0805)    propofoL  (DIPRIVAN ) 10 mg/mL IV drip 20 mcg/kg/min (08/28/24 0557)    sodium chloride  0.9% TKO infusion 5 mL/hr at 08/26/24 1026    vasopressin  (VASOSTRICT ) 20 units in sodium chloride  0.9% (NS) 100 mL IV infusion (std conc)(premade) Stopped (08/27/24 2325)     PRN and Respiratory Meds:acetaminophen  Q6H PRN, dextrose  50% PRN, fentaNYL  TITRATE **AND** fentaNYL  Q30 MIN PRN, heparin  (porcine) TITRATE **AND** heparin  (porcine) Q6H PRN, pancrelipase  20,880 Units/sodium bicarbonate  650 mg ([place] CLOG DESTROYER) PRN (On Call from Rx), vancomycin , pharmacy to manage Per Pharmacy      Malnutrition Details:              Loss of Subcutaneous Fat: No      Muscle Wasting: No                  Active Wounds                                          [1]   Social History  Socioeconomic History    Marital status: Divorced   Tobacco Use    Smoking status: Never    Smokeless tobacco: Former     Types: Chew     Quit date: 1990    Tobacco comments:     Chewed 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   Vaping Use    Vaping status: Never Used   Substance and Sexual Activity    Alcohol use: Yes     Alcohol/week: 2.0 standard drinks of alcohol     Types: 2 Cans of beer per week     Comment: Occasional Drug use: Never   [2]   Current Facility-Administered Medications:     acetaminophen  oral solution 650 mg, 650 mg, Oral, Q6H PRN, Jobe, Amanda L, MD, 650 mg at 08/27/24 1008    [Held by Provider] allopurinoL  (ZYLOPRIM ) tablet 100 mg, 100 mg, Oral, QDAY, Marty Toribio SAUNDERS, MD, 100 mg at 08/25/24 9062    [Held by Provider] atorvastatin  (LIPITOR) tablet 10 mg, 10 mg, Oral, QDAY, Marty Toribio SAUNDERS, MD, 10 mg at 08/25/24 9062    bacitracin  zinc  topical ointment, , Topical, BID, Marty Toribio SAUNDERS, MD, Given at 08/27/24 2026    dextrose  50% (D50) syringe 25-50 mL, 12.5-25 g, Intravenous, PRN, Andrew Soria, MD    Diet Critical Care Enteral Feeding Volume Based Infusion, , SEE ADMIN INSTRUCTIONS, Continuous, Steven Basso, MD    fentaNYL  (SUBLIMAZE ) 1000 mcg/100 mL NS IV drip (std conc)(premade), 10-70 mcg/hr, Intravenous, TITRATE, Last Rate:  3.5 mL/hr at 08/27/24 2011, 35 mcg/hr at 08/27/24 2011 **AND** fentaNYL  (SUBLIMAZE ) BOLUS for continuous infusion, 25 mcg, Intravenous, Q30 MIN PRN, Jackquline Castleman, MD, 25 mcg at 08/27/24 0820    furosemide  (LASIX ) injection 40 mg, 40 mg, Intravenous, ONCE, Janine Dukes, MD    heparin  (porcine) 25,000 units in dextrose  5% (D5W) 250 mL IV infusion (std conc), 0-3,000 Units/hr, Intravenous, TITRATE, Last Rate: 15 mL/hr at 08/28/24 0718, 1,500 Units/hr at 08/28/24 0718 **AND** heparin  (porcine) BOLUS for continuous infusion (bag) 2,884-5,768 Units, 20-40 Units/kg (Dosing Weight), Intravenous, Q6H PRN, Ngo, Kevin, MD    [Held by Provider] hydroCHLOROthiazide  (HYDRODIURIL ) tablet 25 mg, 25 mg, Oral, QAM8, Marty Toribio SAUNDERS, MD    hydrocortisone  sod succ (PF) (Solu-CORTEF ) injection 50 mg, 50 mg, Intravenous, Q6H*, Jobe, Alan CROME, MD, 50 mg at 08/28/24 0407    insulin  aspart (U-100) (NOVOLOG  FLEXPEN U-100 INSULIN ) injection PEN 0-6 Units, 0-6 Units, Subcutaneous, Q6H, Agapito Ned, DO    norepinephrine  (LEVOPHED ) 16 mg in dextrose  5% (D5W) 250 mL IV drip (quad conc), 0-0.5 mcg/kg/min, Intravenous, TITRATE, Jackquline Castleman, MD, Last Rate: 21.4 mL/hr at 08/28/24 0805, 0.17 mcg/kg/min at 08/28/24 0805    pancrelipase  20,880 Units/sodium bicarbonate  650 mg ([facility] CLOG DESTROYER), , Feeding Tube, PRN (On Call from Rx), Hettie Roselli, MD    pantoprazole  (PROTONIX ) injection 40 mg, 40 mg, Intravenous, QDAY, Provider, Pharmacy, 40 mg at 08/27/24 0825    petrolatum  (STYE) ophthalmic ointment 0.25 inch, 0.25 inch, Both Eyes, Q6H, Jobe, Amanda L, MD, 0.25 inch at 08/28/24 0555    piperacillin /tazobactam (ZOSYN ) 4.5 g in sodium chloride  0.9% (NS) 100 mL IVPB (MB+)(EXTENDED INFUSION), 4.5 g, Intravenous, Q6H*, Cassandr Cederberg, MD, Last Rate: 33 mL/hr at 08/28/24 0556, 4.5 g at 08/28/24 0556    polyethylene glycol 3350  (MIRALAX ) packet 17 g, 1 packet, Feeding Tube, BID, Iannazzo, Emily G, DO, 17 g at 08/27/24 2025    propofoL  (DIPRIVAN ) 10 mg/mL IV drip, 5-70 mcg/kg/min, Intravenous, TITRATE, Jackquline Castleman, MD, Last Rate: 16.1 mL/hr at 08/28/24 0557, 20 mcg/kg/min at 08/28/24 0557    Protein Supplement Packets, , SEE ADMIN INSTRUCTIONS, TID, Miasha Emmons, MD, Given at 08/27/24 2029    sennosides-docusate sodium  (SENOKOT-S) tablet 1 tablet, 1 tablet, Per OG Tube, QDAY, Iannazzo, Emily G, DO, 1 tablet at 08/27/24 0825    sodium chloride  0.9% TKO infusion, , Intravenous, Continuous, Jobe, Amanda L, MD, Last Rate: 5 mL/hr at 08/26/24 1026, New Bag at 08/26/24 1026    vancomycin , pharmacy to manage, 1 each, Service, Per Pharmacy, Jobe, Alan CROME, MD    vancomycin , random dosing, 1 each, Intravenous, Random Dosing, Provider, Pharmacy    vasopressin  (VASOSTRICT ) 20 units in sodium chloride  0.9% (NS) 100 mL IV infusion (std conc)(premade), 1.8 Units/hr, Intravenous, Continuous, Jackquline Castleman, MD, Paused at 08/27/24 2325

## 2024-08-28 NOTE — Progress Notes [1]
 Adult Mechanical Ventilator Liberation    Name: Matthew Paul   MRN: [id number]     DOB: 1966/07/31      Age: 58 y.o.  Admission Date: 08/24/2024     LOS: 3 days     Date of Service: 08/28/2024        Adult Mechanical Ventilator Liberation: Twice daily     Weaning Readiness Screen Met (RT Only):: No, SpO2 <  92% with FiO2 > or equal to 0.6  SAT Safety Screen (Nursing Only): Exempt-Provider orders to hold interruption

## 2024-08-28 NOTE — Progress Notes [1]
 1931 This RN reached out to Belvie Bison, MD for clarification on pt blood pressure goal. Pt bp has been 80s/60s with map of 65 and 90s/50s with map of 63. Per MD map of 65 no systolic goal at this time.

## 2024-08-28 NOTE — Progress Notes [1]
 Adult Mechanical Ventilator Liberation    Name: Matthew Paul   MRN: [id number]     DOB: 10-Mar-1966      Age: 58 y.o.  Admission Date: 08/24/2024     LOS: 3 days     Date of Service: 08/28/2024        Adult Mechanical Ventilator Liberation: Twice daily     Weaning Readiness Screen Met (RT Only):: No, SpO2 <  92% with FiO2 > or equal to 0.6

## 2024-08-29 ENCOUNTER — Inpatient Hospital Stay: Admit: 2024-08-29 | Discharge: 2024-08-29 | Payer: BLUE CROSS/BLUE SHIELD

## 2024-08-29 LAB — BASIC METABOLIC PANEL
~~LOC~~ BKR ANION GAP: 11 (ref 3–12)
~~LOC~~ BKR BLD UREA NITROGEN: 81 mg/dL — ABNORMAL HIGH (ref 7–25)
~~LOC~~ BKR CALCIUM: 9.2 mg/dL (ref 8.5–10.6)
~~LOC~~ BKR CHLORIDE: 102 mmol/L (ref 98–110)
~~LOC~~ BKR CO2: 21 mmol/L (ref 21–30)
~~LOC~~ BKR CREATININE: 3.4 mg/dL — ABNORMAL HIGH (ref 0.40–1.24)
~~LOC~~ BKR GLOMERULAR FILTRATION RATE (GFR): 20 mL/min — ABNORMAL LOW (ref >60)
~~LOC~~ BKR GLUCOSE, RANDOM: 155 mg/dL — ABNORMAL HIGH (ref 70–100)
~~LOC~~ BKR POTASSIUM: 4.3 mmol/L (ref 3.5–5.1)
~~LOC~~ BKR SODIUM, SERUM: 134 mmol/L — ABNORMAL LOW (ref 137–147)

## 2024-08-29 LAB — BLOOD GASES, ARTERIAL
~~LOC~~ BKR BASE DEFICIT-ART: 4.8 mmol/L
~~LOC~~ BKR BICARB, ART(CAL): 20 mmol/L — ABNORMAL LOW (ref 21.0–28.0)
~~LOC~~ BKR O2 SAT-ART: 95 % (ref 95.0–99.0)
~~LOC~~ BKR PCO2-ART: 51 mmHg — ABNORMAL HIGH (ref 35–45)
~~LOC~~ BKR PH-ART: 7.2 — ABNORMAL LOW (ref 7.35–7.45)
~~LOC~~ BKR PO2-ART: 87 mmHg (ref 80–100)

## 2024-08-29 LAB — CULTURE - RESPIRATORY

## 2024-08-29 LAB — COMPREHENSIVE METABOLIC PANEL: ~~LOC~~ BKR SODIUM, SERUM: 133 mmol/L — ABNORMAL LOW (ref 137–147)

## 2024-08-29 LAB — POC GLUCOSE
~~LOC~~ BKR POC GLUCOSE: 135 mg/dL — ABNORMAL HIGH (ref 70–100)
~~LOC~~ BKR POC GLUCOSE: 128 mg/dL — ABNORMAL HIGH (ref 70–100)
~~LOC~~ BKR POC GLUCOSE: 129 mg/dL — ABNORMAL HIGH (ref 70–100)
~~LOC~~ BKR POC GLUCOSE: 159 mg/dL — ABNORMAL HIGH (ref 70–100)
~~LOC~~ BKR POC GLUCOSE: 161 mg/dL — ABNORMAL HIGH (ref 70–100)

## 2024-08-29 LAB — CBC
~~LOC~~ BKR HEMATOCRIT: 33 % — ABNORMAL LOW (ref 40.0–50.0)
~~LOC~~ BKR HEMOGLOBIN: 10 g/dL — ABNORMAL LOW (ref 13.5–16.5)
~~LOC~~ BKR MCH: 30 pg — ABNORMAL HIGH (ref 26.0–34.0)
~~LOC~~ BKR MCHC: 32 g/dL (ref 32.0–36.0)
~~LOC~~ BKR MCV: 95 fL — ABNORMAL LOW (ref 80.0–100.0)
~~LOC~~ BKR MPV: 9.2 fL (ref 7.0–11.0)
~~LOC~~ BKR PLATELET COUNT: 172 10*3/uL — ABNORMAL HIGH (ref 150–400)
~~LOC~~ BKR RBC COUNT: 3.4 10*6/uL — ABNORMAL LOW (ref 4.40–5.50)
~~LOC~~ BKR RBC COUNT: 3.5 10*6/uL — ABNORMAL LOW (ref 4.40–5.50)
~~LOC~~ BKR RDW: 15 % (ref 11.0–15.0)
~~LOC~~ BKR WBC COUNT: 15 10*3/uL — ABNORMAL HIGH (ref 4.50–11.00)
~~LOC~~ BKR WBC COUNT: 15 10*3/uL — ABNORMAL HIGH (ref 4.50–11.00)

## 2024-08-29 LAB — HEPARIN ASSAY (UNFRACTIONATED)
~~LOC~~ BKR HEPARIN ASSAY: 0.1 [IU]/mL — ABNORMAL LOW (ref 0.30–0.70)
~~LOC~~ BKR HEPARIN ASSAY: 0.3 [IU]/mL (ref 0.30–0.70)

## 2024-08-29 LAB — MAGNESIUM: ~~LOC~~ BKR MAGNESIUM: 2.2 mg/dL — ABNORMAL LOW (ref 1.6–2.6)

## 2024-08-29 LAB — TRIGLYCERIDE: ~~LOC~~ BKR TRIGLYCERIDES: 149 mg/dL — ABNORMAL HIGH (ref 26.0–<150)

## 2024-08-29 LAB — VANCOMYCIN TIMED LEVEL: ~~LOC~~ BKR VANCOMYCIN TIMED: 11 g/mL — ABNORMAL HIGH (ref 98–<=30.0)

## 2024-08-29 MED ORDER — HYDROCORTISONE SOD SUCC (PF) 100 MG/2 ML IJ SOLR
50 mg | Freq: Two times a day (BID) | INTRAVENOUS | 0 refills | Status: DC
Start: 2024-08-29 — End: 2024-08-30
  Administered 2024-08-30 (×2): 50 mg via INTRAVENOUS

## 2024-08-29 MED ORDER — SODIUM CHLORIDE 3 % IN NEBU
4 mL | Freq: Two times a day (BID) | RESPIRATORY_TRACT | 0 refills | Status: DC
Start: 2024-08-29 — End: 2024-09-04
  Administered 2024-08-29 – 2024-09-03 (×11): 4 mL via RESPIRATORY_TRACT

## 2024-08-29 MED ORDER — POLYETHYLENE GLYCOL 3350 17 GRAM PO PWPK
2 | Freq: Two times a day (BID) | GASTROSTOMY | 0 refills | Status: DC
Start: 2024-08-29 — End: 2024-08-30
  Administered 2024-08-30: 02:00:00 34 g via GASTROSTOMY

## 2024-08-29 MED ORDER — IPRATROPIUM-ALBUTEROL 0.5 MG-3 MG(2.5 MG BASE)/3 ML IN NEBU
3 mL | RESPIRATORY_TRACT | 0 refills | Status: DC | PRN
Start: 2024-08-29 — End: 2024-09-08
  Administered 2024-08-29 – 2024-09-06 (×2): 3 mL via RESPIRATORY_TRACT

## 2024-08-29 MED ORDER — FUROSEMIDE 10 MG/ML IJ SOLN
40 mg | Freq: Once | INTRAVENOUS | 0 refills | Status: CP
Start: 2024-08-29 — End: ?
  Administered 2024-08-29: 17:00:00 40 mg via INTRAVENOUS

## 2024-08-29 MED ORDER — VANCOMYCIN 1,500 MG IVPB (VIAL/BAG ADAPTER)
1500 mg | Freq: Once | INTRAVENOUS | 0 refills | Status: CP
Start: 2024-08-29 — End: ?
  Administered 2024-08-29 (×2): 1500 mg via INTRAVENOUS

## 2024-08-29 MED ORDER — SENNOSIDES-DOCUSATE SODIUM 8.6-50 MG PO TAB
1 | Freq: Two times a day (BID) | OROGASTRIC | 0 refills | Status: DC
Start: 2024-08-29 — End: 2024-08-30
  Administered 2024-08-30: 02:00:00 1 via OROGASTRIC

## 2024-08-29 NOTE — Drug Level [600079]
 Pharmacy Vancomycin  Note  Subjective:   Matthew Paul is a 58 y.o. male being treated for PNA.    Assessment:   Target levels for this patient:  1.  AUC (mcg*h/mL):  400-600  2.  Trough (mcg/mL): 15-20    Evaluation of AUC and/or level(s): ~36 hr level, appropraite for re-dose    Using population based kinetics, estimated tau 26.8 hours.    Plan:   Give vancomycin  1500 mg once and continue random dosing  Next scheduled level(s): 24 hr from today's dose  Pharmacy will continue to monitor and adjust therapy as needed.    Rocky Brass) Orene, PharmD, BCCCP  Clinical Pharmacist--Critical Care/Emergency Department  08/29/2024    Objective:     Drug Levels:  Vancomycin  Timed   Date/Time Value Ref Range Status   08/29/2024 0402 11.8 <=30.0 ?g/mL Final       Current Vancomycin  Orders   Medication Dose Route Frequency    vancomycin  (VANCOCIN ) 1,500 mg in sodium chloride  0.9% 250 mL IVPB (Vial/Bag Adapter)  1,500 mg Intravenous ONCE    vancomycin , pharmacy to manage  1 each Service Per Pharmacy    vancomycin , random dosing  1 each Intravenous Random Dosing       Recent Vancomycin  Dosing and Administration:  Recent Vancomycin  Admin                     vancomycin  (VANCOCIN ) 1,500 mg in sodium chloride  0.9% 250 mL IVPB (Vial/Bag Adapter) (mg) 1,500 mg New Bag 08/29/24 0944    vancomycin  (VANCOCIN ) 2,500 mg in sodium chloride  0.9% 550 mL IVPB (mg) 2,500 mg New Bag 08/27/24 1512                    Start date of vancomycin  therapy: 08/27/2024  Additional Abx: pip/tazo  Cultures: NGTD  White Blood Cells   Date/Time Value Ref Range Status   08/29/2024 0402 15.90 (H) 4.50 - 11.00 10*3/uL Final   08/28/2024 0425 18.80 (H) 4.50 - 11.00 10*3/uL Final   08/27/2024 0230 11.40 (H) 4.50 - 11.00 10*3/uL Final     Creatinine   Date/Time Value Ref Range Status   08/29/2024 0402 3.07 (H) 0.40 - 1.24 mg/dL Final   88/81/7974 8497 3.40 (H) 0.40 - 1.24 mg/dL Final   88/81/7974 9574 3.00 (H) 0.40 - 1.24 mg/dL Final     Blood Urea Nitrogen Date/Time Value Ref Range Status   08/29/2024 0402 70 (H) 7 - 25 mg/dL Final     Estimated CrCl: 41.4 mL/min    Intake/Output Summary (Last 24 hours) at 08/29/2024 1033  Last data filed at 08/29/2024 1000  Gross per 24 hour   Intake 2925.42 ml   Output 3825 ml   Net -899.58 ml      Actual Weight:  142.1 kg (313 lb 4.4 oz)

## 2024-08-29 NOTE — Progress Notes [1]
 Adult Mechanical Ventilator Liberation    Name: Matthew Paul   MRN: [id number]     DOB: 04-06-1966      Age: 58 y.o.  Admission Date: 08/24/2024     LOS: 4 days     Date of Service: 08/29/2024        Adult Mechanical Ventilator Liberation: Twice daily     Weaning Readiness Screen Met (RT Only):: No, no evidence of improvement of reversal of cause of respiratory failure (e.g. fluid overload, pneumonia, ARDS, etc.)  SAT Safety Screen (Nursing Only): Exempt-Provider orders to hold interruption

## 2024-08-29 NOTE — Progress Notes [1]
 MEDICAL INTENSIVE CARE UNIT  PROGRESS NOTE       Patient's Name:  Matthew Paul MRN: [id number]   Patient's DOB: [id number]   Today's Date:  08/29/2024  Admission Date: 08/24/2024  LOS: 4 days    Problem list  Principal Problem:    Acquired bilateral renal cysts  Active Problems:    ADPKD (autosomal dominant polycystic kidney disease)    Cardiac arrest (CMS-HCC)    Acute kidney injury superimposed on CKD    Obstructive cardiovascular shock (CMS-HCC)    Lactic acidosis    High anion gap metabolic acidosis    Acute pulmonary embolism with acute cor pulmonale (CMS-HCC)    Acute respiratory failure with hypoxia (CMS-HCC)      HOSPITAL COURSE SUMMARY     Carlie Corpus is a 58 y.o. 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  Patient admitted initially on 11/14 for planned left robotic renal cyst decortication and cystoscopy with ureteral stent placement.  No complications during procedure and patient had existing JP drain placed.  Unfortunately, patient suffered PEA cardiac arrest on 11/15 with ROSC following 2 rounds of epinephrine .    Patient was transported to the ICU on 11/15 and initially required high vent settings [PEEP 10, FiO2 100%] and pressor support with NE and vaso.  Cardiology consulted for abnormal EKG with new RBBB.  Mild metabolic derangements on initial labs without electrolyte disturbances.  Formal TTE stat with EF 55%, grade 1 diastolic dysfunction, and moderate to severely dilated LV with PASP 34.  CTA chest 11/15 showing massive bilateral PE, right greater than left with significant RV strain.  CT AP without evidence of hemorrhage or acute abdominal pathology.  After much discussion with family regarding risk/benefit, started on therapeutic heparin  drip 11/15.     Interval update 08/29/2024:  > Continue ventilation and sedation with prop and fent, wean as able.    > Ventilation requirements improving with PEEP 8 and FiO2 50% (FiO2 70% yesterday).    > Hold off on RT trials  due to increased PIP and thick secretions seen this morning. May consider beginning RT trials this afternoon vs tomorrow morning.  > Begin Chest physiotherapy PD&V, duonebs, and sodium chloride  3% nebulizers for airway clearance  > Continue heparin  gtt  > Continue weaning Levo to maintain goal MAP >=65  > Continue vanc and zosyn , plan for 7 day course ; IV hydrocortisone  50 mg from q6h to BID  > Continue IV lasix  40 mg x1 with good response to diuresis yesterday and stable kidney function    ASSESSMENT & PLAN     NEURO/PSYCH  #Sedated   - Initially post-code: moving all extremities, opening eyes, attempting to pull at tube prior to sedation   PLAN:  > Sedation w/ prop & fent, wean as able   > Triglycerides 149, continue to trend and may consider switching from propofol  to precedex  if triglycerides increase > 300  > No concern for mentation at this time as requiring high levels of sedation, defer CTH.       -----------------------------------------------------------------------------------------------------------------------------------------------------------------------------------------------------------------------  PULMONARY  #Mechanical intubation for airway protection   #Bilateral massive PE  - Intubated on 11/15 post PEA arrest. ETT advanced to 28 at the teeth   - Bedside POCUS concerning for RV overload and McConnell's sign.   - TTE: RV overload further c/f PE  -  CTA Chest: massive multiple BL PE, R>L. Elevated RV:LV c/w right heart strain w/ dilation of RA, RV, main PA  - Result of long discussion with family, pharmacy, interventional radiology, regarding overall multidisciplinary approach and decision for tPA versus heparin  was to initiate heparin .  Discussed risks/benefit with family at length especially given his recent surgical procedure yesterday and existing JP drain, along with potential for existing cerebral aneurysms that are often associated with autosomal dominant PKD.  They understand this and would like to continue forward with heparin  GGT.    PLAN:  >Invasive Mode: V/AC+  Set VT (mL):  [550 milliliters]   Expired VT Spontaneous (mL):  [0 milliliters]   Expiratory VT (mL):  [460 mL]   Set RR:  [20 breaths/minutes]   Total Respiratory Rate:  [23 breaths/minutes]   Minute Volume (Lpm):  [12.3 liters/minutes]   %MVspontaneous:  [0 %]   PIP Actual (cmH2O):  [26 cm H20]   PEEP/CPAP (cmH2O):  [8 cm H2O]   Intrinsic PEEP (cmH2O):  [7 cmH2O]   Mean Airway Pressure (cmH2O):  [14 cm H2O]   > Repeat ABG w/ vent changes   > Frequent neurovascular checks  > If becomes hemodynamically unstable or repeat cardiac arrest, will start tPA vs ECMO  > Continue heparin  gtt - monitor Xa  > Thick secretions and elevated PIP this morning:   > Stop CRLT as this may have contributed to partial kink in tubing  > Chest physiotherapy and PD&V  > Duonebs and sodium chloride  3% nebulizers BID and PRN  > Hold of on RT trials due to increased PIP this morning, consider starting this afternoon vs tomorrow  > Consider bronchoscopy if no improvement in secretions with above  > Diuresis for volume overload as below in cardiology section    #Suspected left lower lobe pneumonia  - MRSA nares +  - 11/17 CXR: Increasing left lower lobe consolidation which could be infection, aspiration, or worsening atelectasis.   PLAN:  > Continue vancomycin  and zosyn  (11/17-) to treat pneumonia empirically due to fever and potential pneumonia on CXR   > MRSA nares +, continue vanc   > Sputum GS with 10-25/LPF neutrophils, many mixed bacteria, sputum cx with normal oropharyngeal flora  > Continue hydrocortisone  50 mg IV , decrease frequency from q6h to BID  > Chest physiotherapy and PD&V    -----------------------------------------------------------------------------------------------------------------------------------------------------------------------------------------------------------------------  CARDIOVASCULAR  #PEA arrest   #Shock - obstructive 2/2 PE   #Remote history of Afib (1980s) not on AC   #Volume Overload  - PEA arrest on 11/15, ROSC after 2 rounds of epi and total code lasting 4-6 minutes.  - PTA: ASA 81 mg daily  - EKG:               Previous OSH 2020: SR, Qtc 406               EKG (11/15 post code): Sinus tachy 100s, Qtc 452, new RBBB  - Pressors: NE, vaso   - Echo (11/15): EF 55%. No dia dysfunction. Mod to severely enlarged RV w/ intraventricular septal flattening. PASP 35 + CVP. No major valvular abnormalities.   - CTA Chest: massive bl PE with RV strain.   - CT AP: normal post-surgical changes.   - Trop: 138   PLAN:  > PEA arrest due to obstructive shock from massive bilateral PE.  He has a new right bundle branch block, likely due to this.  Discussed with interventional radiology, not amenable to thrombectomy at  this time due to location.    > Remains on norepi for pressor support ; vasopressin  was stopped on 11/17 PM   > Titrate to goal MAP > 65   > Would restart vasopressin  if requiring norepi > 0.2  > Likely volume overloaded based on diffuse edema on exam net positive over the hospital course   > Continue lasix  40 mg IV daily   > Goal net neg 1L, monitor I/O    > Repeat BMP this afternoon   > Continue free water  from 125 ml q6h    #HLD   - PTA: atorvastatin  20 mg   - Lipid profile: chol 124, TG 208, HDL 33, LDL 82  PLAN:  > HOLD atorvastatin  given LFTs     #HTN  - PTA meds: HCTZ 25 mg qAM, lisinopril  20 mg daily  PLAN:  > HOLD HCTZ and lisinopril  - due to ongoing hypotension requiring pressors    -----------------------------------------------------------------------------------------------------------------------------------------------------------------------------------------------------------------------  FEN/GI  #Elevated LFTs, likely post-code, resolved  #GERD  #Constipation  - Post code LFTs AST 157 (13 on admit), ALT 163 (12 on admit). Tbili 0.4.  - PTA meds: omeprazole 40 mg daily   - Last bowel movement PTA  PLAN:  > Hold atorvastatin    > Daily CMP   > Continue tube feeds, at goal rate  > Continue PPI daily  > Continue scheduled miralax  BID and senna daily, increase to miralax  2 packets BID and senna 1 tablet BID as no bowel movement yet    -----------------------------------------------------------------------------------------------------------------------------------------------------------------------------------------------------------------------  RENAL  #ADPKD  #AKI on CKD   #HAGMA, mild likely 2/2 above further renal dysfunction  - PTA meds: jynarque  (valptan) 60 mg qAM / 30 mg qPM  - Baseline Cr 2.5 - 2.8   -11/14 underwent left robotic renal cyst decortication, cystoscopy, L ureteral stent placement for enlarging bilateral renal size causing pressure and discomfort in the setting of ADPKD.  Total of roughly 100 cysts were decorticated with 19 large cysts sent for pathology.  Placement of left ureteral stent, confirmation prior to surgical completion that left ureter had no urine leak.  No overt post surgical complications.  - CT AP (11/15): normal post-operative changes  Recent Labs     08/28/24  0425 08/28/24  1502 08/29/24  0402   CR 3.00* 3.40* 3.07*   BUN 51* 58* 70*   GFR 23* 20* 23*     - Net IO Since Admission: 5,177.18 mL [08/29/24 1327]  -   Intake/Output Summary (Last 24 hours) at 08/29/2024 1327  Last data filed at 08/29/2024 1100  Gross per 24 hour   Intake 2669.62 ml   Output 3335 ml   Net -665.38 ml   PLAN:  > JP drain in place with ~ 280 mL out in the last 24 hours, per urology suspected/usual amount of fluid drainage.  > Maintain foley catheter  > AKI stable, continue strict I's and O's, avoid further nephrotoxic agents, renally dose medications as able.      -----------------------------------------------------------------------------------------------------------------------------------------------------------------------------------------------------------------------  ENDOCRINE  #No acute concerns   - Invalid input(s): GLUCOSE  Recent Labs     08/28/24  0100 08/28/24  0554 08/28/24  1317 08/28/24  1648 08/28/24  2152 08/29/24  0010 08/29/24  0630 08/29/24  1129   GLUPOC 161* 133* 131* 140* 135* 161* 128* 159*   PLAN:  > CTM  > LDCF      -----------------------------------------------------------------------------------------------------------------------------------------------------------------------------------------------------------------------  HEME/ONC  #DVT/PE  #Normocytic Anemia  #Thrombocytopenia  - 11/16 doppler BLE: acute appearing  nonocclusive thrombus in L popliteal vein  Recent Labs     08/27/24  0230 08/28/24  0425 08/29/24  0402   HGB 13.4* 12.0* 10.8*   MCV 93.7 93.6 94.7   PLTCT 163 143* 149*   PLAN:  > Monitor HGB, Transfuse if Hgb <7 and PLT < 10   > DVT Ppx: Has been on heparin  prior to procedure on initial admission   > Continue heparin  gtt - monitoring with Xa   > No evidence of acute bleeding. Ongoing monitor for s/s of bleeding.    > CBC this afternoon to monitor Hgb as it has been downtrending  > If significant HD instability: obtain TEG, CBC STAT and if dropping hgb consider fast scan for eval of retroperitoneal bleed/hematoma. At this time, no concerns for such.     #Leukocytosis, likely reactive   - Initial leukocytosis downtrended, consistent with reactive leukocytosis due to surgery/cardiac arrest, until elevation on 11/18  PLAN:   > Likely due to initiation of steroids on 11/17, continue to monitor with daily CBC  -----------------------------------------------------------------------------------------------------------------------------------------------------------------------------------------------------------------------  ID  #Febrile  #Suspected Left Lower Lobe Pneumonia  - Last febrile temperature at 2000 on 11/17  - MRSA nares +  - 11/17 CXR: Increasing left lower lobe consolidation which could be infection, aspiration, or worsening atelectasis.   Recent Labs     08/27/24  0230 08/28/24  0425 08/29/24  0402   WBC 11.40* 18.80* 15.90*     - Temp (24hrs), Avg:37.5 ?C (99.5 ?F), Min:37.2 ?C (99 ?F), Max:37.8 ?C (100 ?F)      Culture Data Date collected Result   Blood cx 11/17 NG x 2d   Sputum cx 11/17 Normal oropharyngeal flora   Urine cx 11/17 NG x 24h     Antimicrobial First dose Last dose Comment   Ancef  11/14 11/14 X2, for perioperative abx   Vancomycin  11/17     Zosyn  11/17       PLAN:  > Follow cultures as above  > Continue zosyn  and vancomycin  to treat pneumonia empirically due to fever and possible pneumonia on CXR. Plan for 7 day course (11/17-11/23)  > Decrease hydrocortisone  from 50 mg IV q6h to BID  > Chest physiotherapy and PD&V    -----------------------------------------------------------------------------------------------------------------------------------------------------------------------------------------------------------------------  MSK/DERM  #Gout   PLAN:  > Hold PTA allopurinol , colchicine  0.6 mg prn for flares     -----------------------------------------------------------------------------------------------------------------------------------------------------------------------------------------------------------------------  LDA/PROPHYLAXIS  Patient Lines/Drains/Airways Status       Active Lines:       Name Placement date Placement time Site Days    CVC Triple Femoral, right 08/25/24  1326  -- 4    Surgical Drain Bulb (e.g. JP) Left Abdomen #1 08/24/24  1733  -- 5    Temporary GI Tube Orogastric 08/26/24  1200  -- 3    Indwelling Urinary Catheter 16 FR Standard 2-way 08/27/24  1100  -- 2    Peripheral IV 08/24/24 1101 Left Distal;Posterior Forearm 20 G 08/24/24  1101  -- 5                    Lines: PIV x2, CVC R femoral (11/15 -)  Tubes: JP drain L abd (11/14 -), ETT (11/15 -)  Urinary Catheter:  Yes (11/15 -)  VTE ppx: SCDs, heparin  gtt  GI:  PPI  Bowel regimen: miralax  and senna  Diet: Regular Diet  DIET NPO Strict  Code status: Full Code     Patient case discussed with attending physician, Dr.  Alan LITTIE Constable, MD     Ludie Overly, MD  Internal Medicine PGY-1   Available on Voalte    SUBJECTIVE     Overnight, FiO2 was weaned from 70% to 50%.  He remains at a PEEP of 8.     This morning, he acutely had an elevation in PIP. Suctioned thick secretions and RT to lavage. Tube may have been partially kinked due to patient's position in bed - will stop CLRT.     Patient remains intubated, sedated, on mechanical ventilation. Following commands.     Past Medical History:    Acid reflux    ADPKD (autosomal dominant polycystic kidney)    Apnea    Gout    Hyperlipidemia    Hypertension    Mild shortness of breath    Paroxysmal A-fib (CMS-HCC)     Surgical History:   Procedure Laterality Date    HX HERNIA REPAIR Right 1983    ROBOTIC LAPAROSCOPIC ABLATION RENAL CYSTS Right 06/08/2019    Performed by Erskin Lenis, MD at [facility] OR    ROBOT ASSISTED SURGERY N/A 06/08/2019    Performed by Erskin Lenis, MD at [facility] OR    CYSTOURETHROSCOPY WITH INDWELLING URETERAL STENT INSERTION Right 06/08/2019    Performed by Erskin Lenis, MD at [facility] OR    ROBOTIC ASSISTED LAPAROSCOPIC RENAL CYST DECORTICATION Left 10/02/2019    Performed by Erskin Lenis, MD at [facility] OR    CYSTOURETHROSCOPY WITH INDWELLING URETERAL STENT INSERTION Left 10/02/2019    Performed by Erskin Lenis, MD at [facility] OR    ABLATION, CYST, KIDNEY, LAPAROSCOPIC Left 08/24/2024    Performed by Erskin Lenis LABOR, MD at [facility] OR    ROBOT ASSISTED SURGERY Left 08/24/2024 Performed by Erskin Lenis LABOR, MD at [facility] OR    CYSTOURETHROSCOPY, WITH INDWELLING URETERAL STENT INSERTION Left 08/24/2024    Performed by Erskin Lenis LABOR, MD at [facility] OR    HX TONSILLECTOMY      KIDNEY CYST REMOVAL      Multiple    KNEE CARTILAGE SURGERY Bilateral      No family history on file.  Social History[1]  Vaping/E-liquid Use    Vaping Use Never User      Vaping/E-liquid Substances    CBD No     Nicotine No     Other No     Flavored No     THC No     Unknown No             Current Medications[2]       OBJECTIVE                          Vital Signs: Last Filed                 Vital Signs: 24 Hour Range   BP: 90/57 (11/19 1100)  Temp: 37.4 ?C (99.3 ?F) (11/19 1100)  Pulse: 87 (11/19 1100)  Respirations: 20 PER MINUTE (11/19 1100)  SpO2: 94 % (11/19 1100)  O2%: 50 % (11/19 1100)  O2 Device: Ventilator (11/19 1100) BP: (73-131)/(49-70)   Temp:  [37.2 ?C (99 ?F)-37.8 ?C (100 ?F)]   Pulse:  [84-112]   Respirations:  [9 PER MINUTE-26 PER MINUTE]   SpO2:  [91 %-96 %]   O2%:  [50 %-70 %]   O2 Device: Ventilator     Vitals:    08/25/24 1509 08/26/24 1000 08/29/24 0400   Weight: (!) 144.2 kg (  318 lb) (!) 144.2 kg (317 lb 14.5 oz) (!) 142.1 kg (313 lb 4.4 oz)         Artificial Airway   Endotracheal Tube       Ventilator/Respiratory Therapy  Yes: Invasive Mode: V/AC+  Set VT (mL):  [550 milliliters]   Expired VT Spontaneous (mL):  [0 milliliters]   Expiratory VT (mL):  [460 mL]   Set RR:  [20 breaths/minutes]   Total Respiratory Rate:  [23 breaths/minutes]   Minute Volume (Lpm):  [12.3 liters/minutes]   %MVspontaneous:  [0 %]   PIP Actual (cmH2O):  [26 cm H20]   PEEP/CPAP (cmH2O):  [8 cm H2O]   Intrinsic PEEP (cmH2O):  [7 cmH2O]   Mean Airway Pressure (cmH2O):  [14 cm H2O]      Vent Weaning   Per protocol    Physical Exam  Constitutional: 58 y.o. male intubated and sedated  Head: Normocephalic, atraumatic  Cardiovascular: Regular rhythm, tachycardic rate  Pulmonary: Clear to auscultation bilaterally, no wheezes or rales  GI: Abdomen soft, non-tender, non-distended (baseline habitus), bowel sounds +  Skin: No rashes or bruises, good turgor, cap refill <2s  Neuro:  Moves all extremities, follows commands  Musculoskeletal: No deformities  Lymphatic/extremities: Diffuse mild non-pitting edema    Laboratory:  Recent Labs     08/27/24  0230 08/28/24  0425 08/28/24  1502 08/29/24  0402   NA 130* 132* 132* 133*   K 4.3 4.5 4.7 4.4   CL 100 102 100 101   CO2 19* 19* 21 21   GAP 11 11 11 11    BUN 49* 51* 58* 70*   CR 3.23* 3.00* 3.40* 3.07*   GLU 147* 152* 146* 144*   CA 9.5 9.0 9.2 9.4   ALBUMIN  3.3* 3.1*  --  2.9*   MG 1.9 2.0  --  2.2       Recent Labs     08/26/24  1749 08/27/24  0230 08/28/24  0425 08/29/24  0402   WBC  --  11.40* 18.80* 15.90*   HGB  --  13.4* 12.0* 10.8*   HCT  --  40.2 36.7* 32.7*   PLTCT  --  163 143* 149*   PTT 99.4*  --   --   --    AST  --  30 19 13    ALT  --  17 3* 3*   ALKPHOS  --  61 56 49      Estimated Creatinine Clearance: 41.4 mL/min (A) (by C-G formula based on SCr of 3.07 mg/dL (H)).  Vitals:    08/25/24 1509 08/26/24 1000 08/29/24 0400   Weight: (!) 144.2 kg (318 lb) (!) 144.2 kg (317 lb 14.5 oz) (!) 142.1 kg (313 lb 4.4 oz)      Recent Labs     08/28/24  0425 08/29/24  0753   PHART 7.29* 7.26*   PO2ART 115* 87         Pertinent radiology reviewed.    CHEST SINGLE VIEW   Final Result         Stable support devices.      Persistent small left pleural effusion with slight improved though unresolved left lower lobe consolidation.      Stable cardiomediastinal silhouette.          Finalized by Eleanor Don, M.D. on 08/29/2024 8:28 AM. Dictated by Eleanor Don, M.D. on 08/29/2024 8:27 AM.      FEEDING TUBE PLCMNT (ABD/CHEST LMTD)   Final Result  FINDINGS/IMPRESSION:      Gastric tube courses below the diaphragm with tip projecting over the gastric antrum.      Moderate gaseous distention of the stomach. Visualized bowel gas pattern nonobstructive.       Similar left lower lobe consolidation. By my electronic signature, I attest that I have personally reviewed the images for this examination and formulated the interpretations and opinions expressed in this report          Finalized by Omar Pinal, D.O. on 08/28/2024 10:20 AM. Dictated by Ozell Mall, DO on 08/28/2024 9:17 AM.      CHEST SINGLE VIEW   Final Result         Endotracheal tube with the tip approximately 8 cm above the carina. This could be slightly advanced for more optimal positioning.      Gastric tube courses below the diaphragm and out of the field-of-view.      Increasing left lower lobe consolidation which could be infection, aspiration, or worsening atelectasis.          Finalized by Eleanor Don, M.D. on 08/27/2024 12:25 PM. Dictated by Eleanor Don, M.D. on 08/27/2024 12:23 PM.      FEEDING TUBE PLCMNT (ABD/CHEST LMTD)   Final Result         Gastric tube with sidehole projecting over the body of the stomach and tip projecting at the gastric antrum.       The stomach is mildly distended.       Patchy bibasilar opacities are better demonstrated on recent CT chest.      By my electronic signature, I attest that I have personally reviewed the images for this examination and formulated the interpretations and opinions expressed in this report          Finalized by Christobal SHAUNNA Sawyer, MD on 08/26/2024 1:35 PM. Dictated by Mont Lay, MD on 08/26/2024 1:28 PM.      US  VENOUS DOPPLER BILATERAL   Final Result         1.  Acute appearing nonocclusive thrombus in the left popliteal vein.      2.  No femoral/popliteal deep venous thrombosis in right lower extremity.      By my electronic signature, I attest that I have personally reviewed the images for this examination and formulated the interpretations and opinions expressed in this report          Finalized by Allean Pouch, M.D. on 08/26/2024 11:24 AM. Dictated by Mont Lay, MD on 08/26/2024 11:14 AM.      US  EXTREMITY LIMITED RIGHT   Final Result         1. Acute appearing nonocclusive thrombus in the left popliteal vein.      2.  No femoral/popliteal deep venous thrombosis in right lower extremity.      By my electronic signature, I attest that I have personally reviewed the images for this examination and formulated the interpretations and opinions expressed in this report          Finalized by Allean Pouch, M.D. on 08/26/2024 11:24 AM. Dictated by Mont Lay, MD on 08/26/2024 11:14 AM.      CTA CHEST PULM EMBOLISM W/CONT   Final Result         CTA CHEST:      1. Multiple bilateral pulmonary emboli as described above, right greater than left. Elevated RV: LV ratio consistent with right heart strain, with dilatation of the right atrium, right ventricle and main pulmonary artery. Borderline  cardiomegaly.   2. Moderate left lower lobe with additional areas of mild bilateral atelectasis, without significant pulmonary infarct or pleural effusion.      CT abdomen and pelvis:      1. Postsurgical changes of reported recent left robotic renal cyst decortication, with left perinephric stranding and multifocal gas. Small amount of free air is noted and likely postoperative. Surgical drain extending to the left lateral perinephric region, with no abdominal or pelvic fluid collection.   2. Redemonstration of marked bilateral renal enlargement with innumerable cysts, consistent with autosomal dominant polycystic kidney disease. 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   3. Bladder is decompressed about a Foley catheter, limiting evaluation.   4. Colonic diverticulosis without acute diverticulitis or bowel obstruction.   5. Trace pelvic free fluid, nonspecific though possibly postoperative.      Major preliminary findings including presence of extensive pulmonary emboli with right heart strain was conveyed to Dr. Iannazzo by Dairl messenger at 4:34 PM on 08/25/2024.          Finalized by Norleen Pane, M.D. on 08/25/2024 4:35 PM. Dictated by Norleen Pane, M.D. on 08/25/2024 4:12 PM.      CT ABD/PELV W CONTRAST   Final Result         CTA CHEST:      1. Multiple bilateral pulmonary emboli as described above, right greater than left. Elevated RV: LV ratio consistent with right heart strain, with dilatation of the right atrium, right ventricle and main pulmonary artery. Borderline cardiomegaly.   2. Moderate left lower lobe with additional areas of mild bilateral atelectasis, without significant pulmonary infarct or pleural effusion.      CT abdomen and pelvis:      1. Postsurgical changes of reported recent left robotic renal cyst decortication, with left perinephric stranding and multifocal gas. Small amount of free air is noted and likely postoperative. Surgical drain extending to the left lateral perinephric region, with no abdominal or pelvic fluid collection.   2. Redemonstration of marked bilateral renal enlargement with innumerable cysts, consistent with autosomal dominant polycystic kidney disease. 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   3. Bladder is decompressed about a Foley catheter, limiting evaluation.   4. Colonic diverticulosis without acute diverticulitis or bowel obstruction.   5. Trace pelvic free fluid, nonspecific though possibly postoperative.      Major preliminary findings including presence of extensive pulmonary emboli with right heart strain was conveyed to Dr. Iannazzo by Dairl messenger at 4:34 PM on 08/25/2024.          Finalized by Norleen Pane, M.D. on 08/25/2024 4:35 PM. Dictated by Norleen Pane, M.D. on 08/25/2024 4:12 PM.      2D + DOPPLER ECHO   Final Result      CHEST SINGLE VIEW   Final Result   FINDINGS/IMPRESSION:        Support Devices: Endotracheal tube has been slightly advanced, with the tip now projecting 3 cm above the carina. This could be slightly advanced for optimal positioning.      Lungs/Pleura: Inferior aspects of both lungs, particularly the left, are excluded from the field-of-view,. Patchy bibasilar opacities, likely atelectasis. No pneumothorax.      Heart and Mediastinum: The cardiomediastinal silhouette is stable.             Finalized by JOSETTE BEAGLE, M.D. on 08/25/2024 12:48 PM. Dictated by JOSETTE BEAGLE, M.D. on 08/25/2024 12:46 PM.  CHEST SINGLE VIEW   Final Result   FINDINGS/IMPRESSION:        Support Devices: Endotracheal tube in place, with the tip projecting 0.5 cm above the carina. Advancement is recommended..      Lungs/Pleura: Inferior aspect of the left lung, and inferior right costophrenic angle are excluded from the film.SABRA Patchy bibasilar opacities, likely atelectasis. Mild pulmonary edema.. No pneumothorax.SABRA      Heart and Mediastinum: Cardiac silhouette is incompletely visualized, although likely within normal limits.          Finalized by JOSETTE BEAGLE, M.D. on 08/25/2024 12:49 PM. Dictated by JOSETTE BEAGLE, M.D. on 08/25/2024 12:48 PM.           Scheduled Meds:[Held by Provider] allopurinoL  (ZYLOPRIM ) tablet 100 mg, 100 mg, Oral, QDAY  [Held by Provider] atorvastatin  (LIPITOR) tablet 10 mg, 10 mg, Oral, QDAY  bacitracin  zinc  topical ointment, , Topical, BID  [Held by Provider] hydroCHLOROthiazide  (HYDRODIURIL ) tablet 25 mg, 25 mg, Oral, QAM8  hydrocortisone  sod succ (PF) (Solu-CORTEF ) injection 50 mg, 50 mg, Intravenous, BID  insulin  aspart (U-100) (NOVOLOG  FLEXPEN U-100 INSULIN ) injection PEN 0-6 Units, 0-6 Units, Subcutaneous, Q6H  pantoprazole  (PROTONIX ) injection 40 mg, 40 mg, Intravenous, QDAY  petrolatum  (STYE) ophthalmic ointment 0.25 inch, 0.25 inch, Both Eyes, Q6H  piperacillin /tazobactam (ZOSYN ) 4.5 g in sodium chloride  0.9% (NS) 100 mL IVPB (MB+)(EXTENDED INFUSION), 4.5 g, Intravenous, Q6H*  polyethylene glycol 3350  (MIRALAX ) packet 34 g, 2 packet, Feeding Tube, BID  Protein Supplement Packets, , SEE ADMIN INSTRUCTIONS, TID  sennosides-docusate sodium  (SENOKOT-S) tablet 1 tablet, 1 tablet, Per OG Tube, BID  sodium chloride  (NEBUSAL) 3 % nebulizer solution 4 mL, 4 mL, Inhalation, BID  vancomycin , random dosing, 1 each, Intravenous, Random Dosing    Continuous Infusions:   Diet Critical Care Enteral Feeding Volume Based Infusion 35 mL/hr at 08/28/24 1002    fentaNYL  (SUBLIMAZE ) 1000 mcg/100 mL NS IV drip (std conc)(premade) 30 mcg/hr (08/28/24 2037)    heparin  (porcine) 25,000 units in dextrose  5% (D5W) 250 mL IV infusion (std conc) 1,700 Units/hr (08/29/24 0719)    norepinephrine  (LEVOPHED ) 16 mg in dextrose  5% (D5W) 250 mL IV drip (quad conc) 0.13 mcg/kg/min (08/29/24 1055)    propofoL  (DIPRIVAN ) 10 mg/mL IV drip 25 mcg/kg/min (08/29/24 0912)    sodium chloride  0.9% TKO infusion 5 mL/hr at 08/26/24 1026     PRN and Respiratory Meds:acetaminophen  Q6H PRN, albuterol -ipratropium Q4H PRN, dextrose  50% PRN, fentaNYL  TITRATE **AND** fentaNYL  Q30 MIN PRN, heparin  (porcine) TITRATE **AND** heparin  (porcine) Q6H PRN, pancrelipase  20,880 Units/sodium bicarbonate  650 mg ( CLOG DESTROYER) PRN (On Call from Rx), vancomycin , pharmacy to manage Per Pharmacy      Malnutrition Details:              Loss of Subcutaneous Fat: No      Muscle Wasting: No                  Active Wounds                                            [1]   Social History  Socioeconomic History    Marital status: Divorced   Tobacco Use    Smoking status: Never    Smokeless tobacco: Former     Types: Chew     Quit date: 1990    Tobacco comments:  Chewed x10 years   Vaping Use    Vaping status: Never Used   Substance and Sexual Activity    Alcohol use: Yes     Alcohol/week: 2.0 standard drinks of alcohol     Types: 2 Cans of beer per week     Comment: Occasional    Drug use: Never   [2]   Current Facility-Administered Medications:     acetaminophen  oral solution 650 mg, 650 mg, Oral, Q6H PRN, Jobe, Alan CROME, MD, 650 mg at 08/27/24 1008    albuterol -ipratropium (DUONEB) nebulizer solution 3 mL, 3 mL, Inhalation, Q4H PRN, Iannazzo, Emily G, DO, 3 mL at 08/29/24 1307    [Held by Provider] allopurinoL  (ZYLOPRIM ) tablet 100 mg, 100 mg, Oral, QDAY, Marty Toribio SAUNDERS, MD, 100 mg at 08/25/24 9062    [Held by Provider] atorvastatin  (LIPITOR) tablet 10 mg, 10 mg, Oral, QDAY, Marty Toribio SAUNDERS, MD, 10 mg at 08/25/24 9062    bacitracin  zinc  topical ointment, , Topical, BID, Marty Toribio SAUNDERS, MD, Given at 08/29/24 0945    dextrose  50% (D50) syringe 25-50 mL, 12.5-25 g, Intravenous, PRN, Yolette Hastings, MD    Diet Critical Care Enteral Feeding Volume Based Infusion, , SEE ADMIN INSTRUCTIONS, Continuous, Janine Dukes, MD, Last Rate: 35 mL/hr at 08/28/24 1002, New Bag at 08/28/24 1002    fentaNYL  (SUBLIMAZE ) 1000 mcg/100 mL NS IV drip (std conc)(premade), 10-70 mcg/hr, Intravenous, TITRATE, Last Rate: 3 mL/hr at 08/28/24 2037, 30 mcg/hr at 08/28/24 2037 **AND** fentaNYL  (SUBLIMAZE ) BOLUS for continuous infusion, 25 mcg, Intravenous, Q30 MIN PRN, Jackquline Castleman, MD, 25 mcg at 08/29/24 0006    heparin  (porcine) 25,000 units in dextrose  5% (D5W) 250 mL IV infusion (std conc), 0-3,000 Units/hr, Intravenous, TITRATE, Last Rate: 17 mL/hr at 08/29/24 0719, 1,700 Units/hr at 08/29/24 0719 **AND** heparin  (porcine) BOLUS for continuous infusion (bag) [phone] Units, 20-40 Units/kg (Dosing Weight), Intravenous, Q6H PRN, Ngo, Kevin, MD, 5,768 Units at 08/29/24 0655    [Held by Provider] hydroCHLOROthiazide  (HYDRODIURIL ) tablet 25 mg, 25 mg, Oral, QAM8, Marty Toribio SAUNDERS, MD    hydrocortisone  sod succ (PF) (Solu-CORTEF ) injection 50 mg, 50 mg, Intravenous, BID, Iannazzo, Emily G, DO    insulin  aspart (U-100) (NOVOLOG  FLEXPEN U-100 INSULIN ) injection PEN 0-6 Units, 0-6 Units, Subcutaneous, Q6H, Agapito Ned, DO    norepinephrine  (LEVOPHED ) 16 mg in dextrose  5% (D5W) 250 mL IV drip (quad conc), 0-0.5 mcg/kg/min, Intravenous, TITRATE, Jackquline Castleman, MD, Last Rate: 16.4 mL/hr at 08/29/24 1055, 0.13 mcg/kg/min at 08/29/24 1055    pancrelipase  20,880 Units/sodium bicarbonate  650 mg ([place] CLOG DESTROYER), , Feeding Tube, PRN (On Call from Rx), Deforest Maiden, MD    pantoprazole  (PROTONIX ) injection 40 mg, 40 mg, Intravenous, QDAY, Provider, Pharmacy, 40 mg at 08/29/24 9087    petrolatum  (STYE) ophthalmic ointment 0.25 inch, 0.25 inch, Both Eyes, Q6H, Jobe, Amanda L, MD, 0.25 inch at 08/29/24 1128    piperacillin /tazobactam (ZOSYN ) 4.5 g in sodium chloride  0.9% (NS) 100 mL IVPB (MB+)(EXTENDED INFUSION), 4.5 g, Intravenous, Q6H*, Iannazzo, Emily G, DO, Last Rate: 33 mL/hr at 08/29/24 1127, 4.5 g at 08/29/24 1127    polyethylene glycol 3350  (MIRALAX ) packet 34 g, 2 packet, Feeding Tube, BID, Iannazzo, Emily G, DO    propofoL  (DIPRIVAN ) 10 mg/mL IV drip, 5-70 mcg/kg/min, Intravenous, TITRATE, Jackquline Castleman, MD, Last Rate: 20.1 mL/hr at 08/29/24 0912, 25 mcg/kg/min at 08/29/24 0912    Protein Supplement Packets, , SEE ADMIN INSTRUCTIONS, TID, Jamee Keach, MD, 1 each at 08/29/24 1128  sennosides-docusate sodium  (SENOKOT-S) tablet 1 tablet, 1 tablet, Per OG Tube, BID, Iannazzo, Emily G, DO    sodium chloride  (NEBUSAL) 3 % nebulizer solution 4 mL, 4 mL, Inhalation, BID, Iannazzo, Emily G, DO, 4 mL at 08/29/24 1307    sodium chloride  0.9% TKO infusion, , Intravenous, Continuous, Jobe, Amanda L, MD, Last Rate: 5 mL/hr at 08/26/24 1026, New Bag at 08/26/24 1026    vancomycin , pharmacy to manage, 1 each, Service, Per Pharmacy, Provider, Pharmacy    vancomycin , random dosing, 1 each, Intravenous, Random Dosing, Provider, Pharmacy

## 2024-08-29 NOTE — Progress Notes [1]
 Adult Mechanical Ventilator Liberation    Name: Matthew Paul   MRN: [id number]     DOB: [birth year]      Age: 58 y.o.  Admission Date: 08/24/2024     LOS: 4 days     Date of Service: 08/29/2024        Adult Mechanical Ventilator Liberation: Twice daily     Weaning Readiness Screen Met (RT Only):: (P) No, no evidence of improvement of reversal of cause of respiratory failure (e.g. fluid overload, pneumonia, ARDS, etc.)  SAT Safety Screen (Nursing Only): Exempt-Provider orders to hold interruption

## 2024-08-30 ENCOUNTER — Encounter: Admit: 2024-08-30 | Discharge: 2024-08-30 | Payer: BLUE CROSS/BLUE SHIELD

## 2024-08-30 LAB — CBC
~~LOC~~ BKR HEMATOCRIT: 30 % — ABNORMAL LOW (ref 40.0–50.0)
~~LOC~~ BKR MCH: 31 pg — ABNORMAL LOW (ref 26.0–34.0)
~~LOC~~ BKR MCHC: 33 g/dL (ref 32.0–36.0)
~~LOC~~ BKR MCV: 94 fL — ABNORMAL LOW (ref 80.0–100.0)
~~LOC~~ BKR MPV: 9 fL — ABNORMAL HIGH (ref 7.0–11.0)
~~LOC~~ BKR PLATELET COUNT: 191 10*3/uL — ABNORMAL HIGH (ref 150–400)
~~LOC~~ BKR RBC COUNT: 3.2 10*6/uL — ABNORMAL LOW (ref 4.40–5.50)
~~LOC~~ BKR RDW: 15 % — ABNORMAL LOW (ref 11.0–15.0)
~~LOC~~ BKR WBC COUNT: 12 10*3/uL — ABNORMAL HIGH (ref 4.50–11.00)

## 2024-08-30 LAB — POC GLUCOSE
~~LOC~~ BKR POC GLUCOSE: 117 mg/dL — ABNORMAL HIGH (ref 70–100)
~~LOC~~ BKR POC GLUCOSE: 131 mg/dL — ABNORMAL HIGH (ref 70–100)
~~LOC~~ BKR POC GLUCOSE: 147 mg/dL — ABNORMAL HIGH (ref 70–100)
~~LOC~~ BKR POC GLUCOSE: 148 mg/dL — ABNORMAL HIGH (ref 70–100)

## 2024-08-30 LAB — TRIGLYCERIDE: ~~LOC~~ BKR TRIGLYCERIDES: 156 mg/dL — ABNORMAL HIGH (ref 6.0–<150)

## 2024-08-30 LAB — SURGICAL PATHOLOGY

## 2024-08-30 LAB — COMPREHENSIVE METABOLIC PANEL
~~LOC~~ BKR GLOMERULAR FILTRATION RATE (GFR): 20 mL/min — ABNORMAL LOW (ref >60–4.80)
~~LOC~~ BKR GLUCOSE, RANDOM: 128 mg/dL — ABNORMAL HIGH (ref 70–100)
~~LOC~~ BKR POTASSIUM: 4 mmol/L — ABNORMAL LOW (ref 3.5–5.1)
~~LOC~~ BKR SODIUM, SERUM: 140 mmol/L — ABNORMAL LOW (ref 137–147)

## 2024-08-30 LAB — BLOOD GASES, ARTERIAL
~~LOC~~ BKR BASE DEFICIT-ART: 2.6 mmol/L — ABNORMAL LOW (ref 6.0–8.0)
~~LOC~~ BKR PH-ART: 7.3 mg/dL — ABNORMAL HIGH (ref 7.35–7.45)

## 2024-08-30 LAB — HEPARIN ASSAY (UNFRACTIONATED)
~~LOC~~ BKR HEPARIN ASSAY: 0.2 [IU]/mL — ABNORMAL LOW (ref 0.30–0.70)
~~LOC~~ BKR HEPARIN ASSAY: 0.4 [IU]/mL (ref 0.30–0.70)

## 2024-08-30 LAB — VANCOMYCIN TIMED LEVEL: ~~LOC~~ BKR VANCOMYCIN TIMED: 16 g/mL (ref <=30.0)

## 2024-08-30 LAB — MAGNESIUM: ~~LOC~~ BKR MAGNESIUM: 2.3 mg/dL — ABNORMAL LOW (ref 1.6–2.6)

## 2024-08-30 MED ORDER — APIXABAN 5 MG PO TAB
5 mg | Freq: Two times a day (BID) | ORAL | 0 refills | Status: DC
Start: 2024-08-30 — End: 2024-09-10
  Administered 2024-09-02 – 2024-09-10 (×17): 5 mg via ORAL

## 2024-08-30 MED ORDER — ATORVASTATIN 10 MG PO TAB
10 mg | Freq: Every day | NASOGASTRIC | 0 refills | Status: DC
Start: 2024-08-30 — End: 2024-09-10
  Administered 2024-08-31 – 2024-09-04 (×5): 10 mg via NASOGASTRIC

## 2024-08-30 MED ORDER — SENNOSIDES-DOCUSATE SODIUM 8.6-50 MG PO TAB
1 | Freq: Every day | OROGASTRIC | 0 refills | Status: DC
Start: 2024-08-30 — End: 2024-09-10
  Administered 2024-08-31 – 2024-09-07 (×2): 1 via OROGASTRIC

## 2024-08-30 MED ORDER — VANCOMYCIN 1,500 MG IVPB (VIAL/BAG ADAPTER)
1500 mg | Freq: Once | INTRAVENOUS | 0 refills | Status: CP
Start: 2024-08-30 — End: ?
  Administered 2024-08-30 (×2): 1500 mg via INTRAVENOUS

## 2024-08-30 MED ORDER — APIXABAN 5 MG PO TAB
10 mg | Freq: Two times a day (BID) | ORAL | 0 refills | Status: CP
Start: 2024-08-30 — End: ?
  Administered 2024-08-30 – 2024-09-02 (×6): 10 mg via ORAL

## 2024-08-30 MED ORDER — POLYETHYLENE GLYCOL 3350 17 GRAM PO PWPK
1 | Freq: Every day | GASTROSTOMY | 0 refills | Status: DC
Start: 2024-08-30 — End: 2024-09-10
  Administered 2024-08-31: 14:00:00 17 g via GASTROSTOMY

## 2024-08-30 MED ORDER — TUBE FEED (ENAR)
0 refills | Status: DC
Start: 2024-08-30 — End: 2024-08-31

## 2024-08-30 MED ORDER — APIXABAN 5 MG PO TAB
5 mg | Freq: Two times a day (BID) | ORAL | 0 refills | Status: DC
Start: 2024-08-30 — End: 2024-08-30

## 2024-08-30 MED ORDER — DEXMEDETOMIDINE IN 0.9 % NACL 400 MCG/100 ML (4 MCG/ML) IV SOLN
.2-1 ug/kg/h | INTRAVENOUS | 0 refills | Status: DC
Start: 2024-08-30 — End: 2024-08-31
  Administered 2024-08-30: 16:00:00 0.3 ug/kg/h via INTRAVENOUS
  Administered 2024-08-30: 22:00:00 0.5 ug/kg/h via INTRAVENOUS
  Administered 2024-08-31: 09:00:00 0.7 ug/kg/h via INTRAVENOUS
  Administered 2024-08-31: 03:00:00 0.5 ug/kg/h via INTRAVENOUS
  Administered 2024-08-31: 12:00:00 0.7 ug/kg/h via INTRAVENOUS

## 2024-08-30 NOTE — Progress Notes [1]
 Adult Mechanical Ventilator Liberation    Name: Matthew Paul   MRN: [id number]     DOB: 05/21/1966      Age: 58 y.o.  Admission Date: 08/24/2024     LOS: 5 days     Date of Service: 08/30/2024        Adult Mechanical Ventilator Liberation: Twice daily     Weaning Readiness Screen Met (RT Only):: Yes  Initial Weaning Minute Volume (Lpm): (P) 9.87 L/min  Initial Weaning Respiratory Rate: (P) 15 breaths/min  Initial Weaning VT (mL)(calc): (P) 658 mL  Initial RSBI (Calculated): (P) 23  Spontaneous Breathing Trial Completed (RT Only): Yes (Twice Daily)  Post Weaning Minute Volume (Lpm): (P) 10.1 L/min  Post Weaning Respiratory Rate: (P) 15 breaths/min  Post Weaning VT (mL)(calc): (P) 673 mL  NIF Ventilated (cmH2O): -40 cm H2O  $$ Vital Capacity (mL): 1570 ml  Post RSBI (calc): (P) 22  RSBI Ratio: (P) -4.35  Spontaneous Breathing Trial Successful (Twice Daily): Yes

## 2024-08-30 NOTE — Progress Notes [1]
 CLINICAL NUTRITION                                                        Clinical Nutrition Follow-Up Assessment     Name: Matthew Paul   MRN: [id number]     DOB: 08/25/1966      Age: 58 y.o.  Admission Date: 08/24/2024     LOS: 5 days     Date of Service: 08/30/2024        Recommendation:  REC adjusting to Nutren 1.5 at 17ml/hr; cont 3 prosource packs/d. Provides 1860 kcal, 128g protein, and free water  daily; water  boluses adjusted by team     Comments:  Pt undergoing vent weaning trials but remains on vent this afternoon. Propofol  has been d/c'd. 1 pressor remains on board. EN running at 68ml/hr/goal rate.                  Nutrition Assessment of Patient:  Admit Weight: 144.2 kg;  ; Desired Weight: 100 kg  BMI (Calculated): 35.81;    Pertinent Allergies/Intolerances: reviewed/none     Oral Diet Order: NPO;    Current EN Order: Vital HP at 67ml/hr. 3 prosource packs/d. 125ml water  flush Q6 hours.  Current Energy Intake: NPO  Intake Comment: 3 day EN intake average  Intake (calories) Daily Average : 1396 kilocalories (EN + propofol  (75% kcal needs))  Intake (protein) Daily Average : 110 grams (85%)  Weight Used for Calculation: 133 kg  Estimated Calorie Needs: 1770-1900 (65-70% RMR per [employer] Eq)  Estimated Protein Needs: 130-180 (1.3-1.8g/kg DBW)    Malnutrition Assessment:  Does not meet criteria                           Nutrition Focused Physical Exam:  Loss of Subcutaneous Fat: No;  ;    Muscle Wasting: No;  ;    Physical Assessment:  Generalized Edema: Non-pitting  RUE Edema: Pitting 1+  LUE Edema: Pitting 1+  RLE Edema: Pitting 2+  LLE Edema: Pitting 2+  Pedal Edema: Pitting 2+     Pressure Injury: none          Nutrition Diagnosis:  Swallowing difficulty  Etiology: intubated on vent  Signs & Symptoms: NPO, need for EN support                      Intervention / Plan:  Assessed EN provision/tolerance  EN recs    Landry Mower, RD, LD, CNSC  Office  [phone]  Available via Voalte

## 2024-08-30 NOTE — Progress Notes [1]
 Urology Progress Note 08/31/2024     Assessment/Plan:    Matthew Paul is a 58 y.o. Male with ADPKD with enlarging bilateral renal size causing pressure and s/p LEFT robotic renal cyst decortication (11/14). Postoperative course complicated by pulmonary embolism resulting in cardiac arrest (11/15) with subsequent ROSC.       Will discuss plan with staff surgeon - Dr. Erskin    - MICU primary; greatly appreciate assistance in management   - Pain: Recommend MMPC with APAP, oxycodone  5 mg q4h  - Ok for anticoagulation including tPA if necessary   - CV: Continues on Levo   >CXR 11/17: Increasing left lower lobe consolidation which could be infection, aspiration, or worsening atelectasis    >CXR 11/19: Slight improved left lower lobe consolidation  - Renal: Cr 3.35 (3.4)  - GU: Maintain Foley catheter, maintain JP  - Heme/ID: Hgb 10.2 (10.8),  WBC 12.8 (15.7), trending down   > Febrile 11/17 - started on vanc/zosyn     > Cultures negative to date  > Heparin  gtt per MICU   >Ok for tPA from surgical perspective if necessary   - Urology to follow. Please page with questions or concerns.    Camelia Maroon, MD  Please page Urology on-call with questions.  ________________________________________________________________________   SUBJECTIVE:   Intubated, sedated. No acute changes. Continues on pressor for BP support    OBJECTIVE:                   Vital Signs: Most Recent                Vital Signs: Past 24 Hours   BP: 117/72 (11/21 0530)  Temp: 37.6 ?C (99.7 ?F) (11/21 0500)  Pulse: 72 (11/21 0500)  Respirations: 11 PER MINUTE (11/21 0500)  SpO2: 98 % (11/21 0500)  O2%: 40 % (11/21 0500)  O2 Device: Ventilator (11/21 0500)  BP: (82-151)/(51-81)   Temp:  [37.2 ?C (99 ?F)-38 ?C (100.4 ?F)]   Pulse:  [65-108]   Respirations:  [10 PER MINUTE-22 PER MINUTE]   SpO2:  [93 %-98 %]   O2%:  [40 %]   O2 Device: Ventilator       General: Intubated, sedated  Pulm: Intubated  CV: Regular rate  Abd: Soft, non-distended; incisions c/d/I without erythema or purulence. JP ssang  Extremities: No edema; SCD's in place.  GU: Foley catheter in place draining clear yellow    Date 08/30/24 0701 - 08/31/24 0700 08/31/24 0701 - 09/01/24 0700   Shift 0701-1900 1901-0700 24 Hour Total 0701-1900 1901-0700 24 Hour Total   INTAKE   I.V.(mL/kg/hr) 469(0.3) 251.5 720.5      Other 1042 657 1699      I.V.P.B./FLUSHES 250  250      Shift Total(mL/kg) 8238(87.5) 908.5(6.4) 2669.5(18.8)      OUTPUT   Urine(mL/kg/hr) 1960(1.1) 1525 3485        Urine Output (mL) (Indwelling Urinary Catheter 16 FR Standard 2-way) 1960 1525 3485      Shift Total(mL/kg) 8039(86.1) 1525(10.7) 3485(24.5)      NET -199 -616.5 -815.5      Weight (kg) 142.1 142.1 142.1 142.1 142.1 142.1       Labs:                     Hematology  Chemistry   Recent Labs     08/31/24  0214   WBC 9.70   HGB 9.7*   PLTCT 189    Recent Labs     08/31/24  0214   NA 144   K 3.9   CL 111*   CO2 23   BUN 107*   CR 3.04*   GFR 23*   GLU 154*   CA 9.7        Malnutrition Details:              Loss of Subcutaneous Fat: No      Muscle Wasting: No                  ACTIVE PROBLEMS:  Principal Problem:    Acquired bilateral renal cysts  Active Problems:    ADPKD (autosomal dominant polycystic kidney disease)    Cardiac arrest (CMS-HCC)    Acute kidney injury superimposed on CKD    Obstructive cardiovascular shock (CMS-HCC)    Lactic acidosis    High anion gap metabolic acidosis    Acute pulmonary embolism with acute cor pulmonale (CMS-HCC)    Acute respiratory failure with hypoxia (CMS-HCC)

## 2024-08-30 NOTE — Progress Notes [1]
 MEDICAL INTENSIVE CARE UNIT  PROGRESS NOTE       Patient's Name:  Matthew Paul MRN: [id number]   Patient's DOB: Apr 16, 1966   Today's Date:  08/30/2024  Admission Date: 08/24/2024  LOS: 5 days    Problem list  Principal Problem:    Acquired bilateral renal cysts  Active Problems:    ADPKD (autosomal dominant polycystic kidney disease)    Cardiac arrest (CMS-HCC)    Acute kidney injury superimposed on CKD    Obstructive cardiovascular shock (CMS-HCC)    Lactic acidosis    High anion gap metabolic acidosis    Acute pulmonary embolism with acute cor pulmonale (CMS-HCC)    Acute respiratory failure with hypoxia (CMS-HCC)      HOSPITAL COURSE SUMMARY     Matthew Paul is a 58 y.o. 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  Patient admitted initially on 11/14 for planned left robotic renal cyst decortication and cystoscopy with ureteral stent placement.  No complications during procedure and patient had existing JP drain placed.  Unfortunately, patient suffered PEA cardiac arrest on 11/15 with ROSC following 2 rounds of epinephrine .    Patient was transported to the ICU on 11/15 and initially required high vent settings [PEEP 10, FiO2 100%] and pressor support with NE and vaso.  Cardiology consulted for abnormal EKG with new RBBB.  Mild metabolic derangements on initial labs without electrolyte disturbances.  Formal TTE stat with EF 55%, grade 1 diastolic dysfunction, and moderate to severely dilated LV with PASP 34.  CTA chest 11/15 showing massive bilateral PE, right greater than left with significant RV strain.  CT AP without evidence of hemorrhage or acute abdominal pathology.  After much discussion with family regarding risk/benefit, started on therapeutic heparin  drip 11/15. The patient continued to be intubated and sedated due to high ventilation requirements. Antibiotics were begun on 11/17 given fever and possible pneumonia on CXR. He continues to require pressors, but has been able to slowly wean down. FiO2 was weaned to 40% on 11/20 ; hopeful to extubate on 11/21. Switched from heparin  gtt to eliquis  on 11/20.    Interval update 08/30/2024:  > Did well with breathing trial, but having thick secretions and briefly required increased levo overnight. Therefore hopeful for extubation tomorrow morning.    > MMV   > Switch from propofol  to precedex  to maintain minimal sedation  > IPV qid, duonebs, and sodium chloride  3% nebulizers for airway clearance  > Switch from heparin  gtt to eliquis  10 mg BID through 11/24, then 5 mg BID   > Continue weaning Levo to maintain goal MAP >=65  > Continue vanc and zosyn , plan for 7 day course ; last dose of steroid today  > Follow new blood cultures obtained 11/20  > Hold diuresis today given slight worsening in renal function    ASSESSMENT & PLAN     NEURO/PSYCH  #Sedated   - Initially post-code: moving all extremities, opening eyes, attempting to pull at tube prior to sedation   PLAN:  > Switch from propofol  to precedex  to maintain minimal sedation with plan for extubation tomorrow, continue fentanyl   > No concern for mentation at this time as requiring high levels of sedation, defer CTH.       -----------------------------------------------------------------------------------------------------------------------------------------------------------------------------------------------------------------------  PULMONARY  #Mechanical intubation for airway protection   #Bilateral massive PE  - Intubated  on 11/15 post PEA arrest. ETT advanced to 28 at the teeth   - Bedside POCUS concerning for RV overload and McConnell's sign.   - TTE: RV overload further c/f PE  - CTA Chest: massive multiple BL PE, R>L. Elevated RV:LV c/w right heart strain w/ dilation of RA, RV, main PA  - Result of long discussion with family, pharmacy, interventional radiology, regarding overall multidisciplinary approach and decision for tPA versus heparin  was to initiate heparin .  Discussed risks/benefit with family at length especially given his recent surgical procedure yesterday and existing JP drain, along with potential for existing cerebral aneurysms that are often associated with autosomal dominant PKD.  They understand this and would like to continue forward with heparin  GGT.    PLAN:  >Invasive Mode: MMV  Set VT (mL):  [550 milliliters]   Expired VT Spontaneous (mL):  [732 milliliters-853 milliliters]   Expiratory VT (mL):  [732 mL-853 mL]   Set RR:  [14 breaths/minutes]   Total Respiratory Rate:  [16 breaths/minutes-20 breaths/minutes]   Minute Volume (Lpm):  [13.5 liters/minutes-15.1 liters/minutes]   %MVspontaneous:  [100 %]   PIP Actual (cmH2O):  [14 cm H20-15 cm H20]   PEEP/CPAP (cmH2O):  [8 cm H2O]   Intrinsic PEEP (cmH2O):  [7 cmH2O]   PS (cmH2O):  [5 cm H20]   Mean Airway Pressure (cmH2O):  [10 cm H2O]   > Did well with breathing trial, but having thick secretions and briefly required increased levo overnight. Therefore hopeful for extubation tomorrow morning.    > MMV  > IPV qid, duonebs, and sodium chloride  3% nebulizers for airway clearance  > Switch from heparin  gtt to eliquis  10 mg BID through 11/24, then 5 mg BID   > If becomes hemodynamically unstable or repeat cardiac arrest, will start tPA vs ECMO    #Suspected left lower lobe pneumonia  - MRSA nares +  - 11/17 CXR: Increasing left lower lobe consolidation which could be infection, aspiration, or worsening atelectasis.   PLAN:  > Continue vancomycin  and zosyn  (11/17-) to treat pneumonia empirically due to fever and potential pneumonia on CXR   > MRSA nares +, continue vanc   > Sputum GS with 10-25/LPF neutrophils, many mixed bacteria, sputum cx with normal oropharyngeal flora   > Repeat blood cultures 11/20 due to overnight fever  > Last dose of hydrocortisone  50 mg IV today  > IPV qid, duonebs, and sodium chloride  3% nebulizers for airway clearance    -----------------------------------------------------------------------------------------------------------------------------------------------------------------------------------------------------------------------  CARDIOVASCULAR  #PEA arrest   #Shock - obstructive 2/2 PE   #Remote history of Afib (1980s) not on AC   #Volume Overload  - PEA arrest on 11/15, ROSC after 2 rounds of epi and total code lasting 4-6 minutes.  - PTA: ASA 81 mg daily  - EKG:               Previous OSH 2020: SR, Qtc 406               EKG (11/15 post code): Sinus tachy 100s, Qtc 452, new RBBB  - Pressors: NE, vaso   - Echo (11/15): EF 55%. No dia dysfunction. Mod to severely enlarged RV w/ intraventricular septal flattening. PASP 35 + CVP. No major valvular abnormalities.   - CTA Chest: massive bl PE with RV strain.   - CT AP: normal post-surgical changes.   - Trop: 138   PLAN:  > PEA arrest due to obstructive shock from massive bilateral PE.  He has a  new right bundle branch block, likely due to this.  Discussed with interventional radiology, not amenable to thrombectomy at this time due to location.    > Remains on norepi for pressor support ; vasopressin  was stopped on 11/17 PM   > Titrate to goal MAP > 65   > Would restart vasopressin  if requiring norepi > 0.2  > Hold on further diuresis given slight worsening in kidney function today    #HLD   - PTA: atorvastatin  20 mg   - Lipid profile: chol 124, TG 208, HDL 33, LDL 82  PLAN:  > Resume atorvastatin      #HTN  - PTA meds: HCTZ 25 mg qAM, lisinopril  20 mg daily  PLAN:  > HOLD HCTZ and lisinopril  - due to ongoing hypotension requiring pressors    -----------------------------------------------------------------------------------------------------------------------------------------------------------------------------------------------------------------------  FEN/GI  #Elevated LFTs, likely post-code, resolved  #GERD  #Constipation, resolved  - Post code LFTs AST 157 (13 on admit), ALT 163 (12 on admit). Tbili 0.4.  - PTA meds: omeprazole 40 mg daily   - Last bowel movement 11/19  PLAN:  > Daily CMP   > Continue tube feeds, at goal rate  > Continue PPI daily  > Continue bowel regimen, decrease to miralax  daily and senna daily    -----------------------------------------------------------------------------------------------------------------------------------------------------------------------------------------------------------------------  RENAL  #ADPKD  #AKI on CKD   #HAGMA, mild likely 2/2 above further renal dysfunction  - PTA meds: jynarque  (valptan) 60 mg qAM / 30 mg qPM  - Baseline Cr 2.5 - 2.8   -11/14 underwent left robotic renal cyst decortication, cystoscopy, L ureteral stent placement for enlarging bilateral renal size causing pressure and discomfort in the setting of ADPKD.  Total of roughly 100 cysts were decorticated with 19 large cysts sent for pathology.  Placement of left ureteral stent, confirmation prior to surgical completion that left ureter had no urine leak.  No overt post surgical complications.  - CT AP (11/15): normal post-operative changes  Recent Labs     08/29/24  0402 08/29/24  1327 08/30/24  0259   CR 3.07* 3.44* 3.35*   BUN 70* 81* 88*   GFR 23* 20* 20*     - Net IO Since Admission: 3,741.92 mL [08/30/24 1427]  -   Intake/Output Summary (Last 24 hours) at 08/30/2024 1427  Last data filed at 08/30/2024 1400  Gross per 24 hour   Intake 2989.24 ml   Output 4535 ml   Net -1545.76 ml   PLAN:  > JP drain in place with ~ 60 mL out in the last 24 hours, per urology suspected/usual amount of fluid drainage.  > Maintain foley catheter  > Cr and BUN slightly worse 11/20, hold on further diuresis  > Continue strict I's and O's, avoid further nephrotoxic agents, renally dose medications as able.      -----------------------------------------------------------------------------------------------------------------------------------------------------------------------------------------------------------------------  ENDOCRINE  #No acute concerns   - Invalid input(s): GLUCOSE  Recent Labs     08/28/24  2152 08/29/24  0010 08/29/24  0630 08/29/24  1129 08/29/24  1741 08/29/24  2326 08/30/24  0513 08/30/24  1153   GLUPOC 135* 161* 128* 159* 129* 117* 148* 147*   PLAN:  > CTM  > LDCF      -----------------------------------------------------------------------------------------------------------------------------------------------------------------------------------------------------------------------  HEME/ONC  #DVT/PE  #Normocytic Anemia  #Thrombocytopenia  - 11/16 doppler BLE: acute appearing nonocclusive thrombus in L popliteal vein  Recent Labs     08/29/24  0402 08/29/24  1327 08/30/24  0259   HGB 10.8* 10.8* 10.2*  MCV 94.7 95.2 94.1   PLTCT 149* 172 191   PLAN:  > Monitor HGB, Transfuse if Hgb <7 and PLT < 10   > Switch from heparin  gtt to eliquis  10 mg BID through 11/24, then 5 mg BID   > No evidence of acute bleeding. Ongoing monitor for s/s of bleeding.   > If significant HD instability: obtain TEG, CBC STAT and if dropping hgb consider fast scan for eval of retroperitoneal bleed/hematoma. At this time, no concerns for such.     #Leukocytosis, likely reactive   - Initial leukocytosis downtrended, consistent with reactive leukocytosis due to surgery/cardiac arrest, until elevation on 11/18  PLAN:   > Likely due to initiation of steroids on 11/17, continue to monitor with daily CBC  -----------------------------------------------------------------------------------------------------------------------------------------------------------------------------------------------------------------------  ID  #Febrile  #Suspected Left Lower Lobe Pneumonia  - Last febrile temperature at 2000 on 11/17  - MRSA nares +  - 11/17 CXR: Increasing left lower lobe consolidation which could be infection, aspiration, or worsening atelectasis.   Recent Labs     08/29/24  0402 08/29/24  1327 08/30/24  0259   WBC 15.90* 15.70* 12.80*     - Temp (24hrs), Avg:37.8 ?C (100 ?F), Min:37.5 ?C (99.5 ?F), Max:38.1 ?C (100.6 ?F)      Culture Data Date collected Result   Blood cx 11/17 NG x 3d   Sputum cx 11/17 Normal oropharyngeal flora   Urine cx 11/17 NG x 24h   Blood cx 11/20 pending     Antimicrobial First dose Last dose Comment   Ancef  11/14 11/14 X2, for perioperative abx   Vancomycin  11/17     Zosyn  11/17       PLAN:  > Follow cultures as above  > Continue zosyn  and vancomycin  to treat pneumonia empirically due to fever and possible pneumonia on CXR. Plan for 7 day course (11/17-11/23)  > Last dose of hydrocortisone  50 mg IV today    ----------------------------------------------------------------------------------------------------------------------------------------------------------------------------------------------------------------------  MSK/DERM  #Gout   PLAN:  > Hold PTA allopurinol , colchicine  0.6 mg prn for flares     -----------------------------------------------------------------------------------------------------------------------------------------------------------------------------------------------------------------------  LDA/PROPHYLAXIS  Patient Lines/Drains/Airways Status       Active Lines:       Name Placement date Placement time Site Days    CVC Triple Femoral, right 08/25/24  1326  -- 5    Surgical Drain Bulb (e.g. JP) Left Abdomen #1 08/24/24  1733  -- 6    Temporary GI Tube Orogastric 08/26/24  1200  -- 4    Indwelling Urinary Catheter 16 FR Standard 2-way 08/27/24  1100  -- 3    Peripheral IV 08/24/24 1101 Left Distal;Posterior Forearm 20 G 08/24/24  1101  -- 6                    Lines: PIV x2, CVC R femoral (11/15 -)  Tubes: JP drain L abd (11/14 -), ETT (11/15 -)  Urinary Catheter:  Yes (11/15 -)  VTE ppx: SCDs, eliquis   GI:  PPI  Bowel regimen: miralax  and senna  Diet: Regular Diet  DIET NPO Strict  Code status: Full Code     Patient case discussed with attending physician, Dr. Alan LITTIE Constable, MD     Ludie Overly, MD  Internal Medicine PGY-1   Available on Voalte    SUBJECTIVE     Overnight, he had a sudden episode of hypotension that required norepi up to 0.22. This has since been weaned back down to 0.1. He also  had a low grade febrile temperature of 100.6. New blood cultures were obtained.     Patient remains intubated, sedated, on mechanical ventilation. Following commands.     Past Medical History:    Acid reflux    ADPKD (autosomal dominant polycystic kidney)    Apnea    Gout    Hyperlipidemia    Hypertension    Mild shortness of breath    Paroxysmal A-fib (CMS-HCC)     Surgical History:   Procedure Laterality Date    HX HERNIA REPAIR Right 1983    ROBOTIC LAPAROSCOPIC ABLATION RENAL CYSTS Right 06/08/2019    Performed by Erskin Lenis, MD at [facility] OR    ROBOT ASSISTED SURGERY N/A 06/08/2019    Performed by Erskin Lenis, MD at [facility] OR    CYSTOURETHROSCOPY WITH INDWELLING URETERAL STENT INSERTION Right 06/08/2019    Performed by Erskin Lenis, MD at [facility] OR    ROBOTIC ASSISTED LAPAROSCOPIC RENAL CYST DECORTICATION Left 10/02/2019    Performed by Erskin Lenis, MD at [facility] OR    CYSTOURETHROSCOPY WITH INDWELLING URETERAL STENT INSERTION Left 10/02/2019    Performed by Erskin Lenis, MD at [facility] OR    ABLATION, CYST, KIDNEY, LAPAROSCOPIC Left 08/24/2024    Performed by Erskin Lenis LABOR, MD at [facility] OR    ROBOT ASSISTED SURGERY Left 08/24/2024    Performed by Erskin Lenis LABOR, MD at [facility] OR    CYSTOURETHROSCOPY, WITH INDWELLING URETERAL STENT INSERTION Left 08/24/2024    Performed by Erskin Lenis LABOR, MD at [facility] OR    HX TONSILLECTOMY      KIDNEY CYST REMOVAL      Multiple    KNEE CARTILAGE SURGERY Bilateral      No family history on file.  Social History[1]  Vaping/E-liquid Use    Vaping Use Never User Vaping/E-liquid Substances    CBD No     Nicotine No     Other No     Flavored No     THC No     Unknown No             Current Medications[2]       OBJECTIVE                          Vital Signs: Last Filed                 Vital Signs: 24 Hour Range   BP: 110/65 (11/20 1400)  Temp: 37.7 ?C (99.9 ?F) (11/20 1400)  Pulse: 75 (11/20 1400)  Respirations: 16 PER MINUTE (11/20 1400)  SpO2: 96 % (11/20 1400)  O2%: 40 % (11/20 1400)  O2 Device: Ventilator (11/20 1400) BP: (60-151)/(42-99)   Temp:  [37.5 ?C (99.5 ?F)-38.1 ?C (100.6 ?F)]   Pulse:  [75-108]   Respirations:  [9 PER MINUTE-25 PER MINUTE]   SpO2:  [93 %-97 %]   O2%:  [40 %-50 %]   O2 Device: Ventilator     Vitals:    08/25/24 1509 08/26/24 1000 08/29/24 0400   Weight: (!) 144.2 kg (318 lb) (!) 144.2 kg (317 lb 14.5 oz) (!) 142.1 kg (313 lb 4.4 oz)         Artificial Airway   Endotracheal Tube       Ventilator/Respiratory Therapy  Yes: Invasive Mode: MMV  Set VT (mL):  [550 milliliters]   Expired VT Spontaneous (mL):  [732 milliliters-853 milliliters]   Expiratory VT (mL):  [732 mL-853 mL]  Set RR:  [14 breaths/minutes]   Total Respiratory Rate:  [16 breaths/minutes-20 breaths/minutes]   Minute Volume (Lpm):  [13.5 liters/minutes-15.1 liters/minutes]   %MVspontaneous:  [100 %]   PIP Actual (cmH2O):  [14 cm H20-15 cm H20]   PEEP/CPAP (cmH2O):  [8 cm H2O]   Intrinsic PEEP (cmH2O):  [7 cmH2O]   PS (cmH2O):  [5 cm H20]   Mean Airway Pressure (cmH2O):  [10 cm H2O]      Vent Weaning   Per protocol    Physical Exam  Constitutional: 58 y.o. male intubated and sedated  Head: Normocephalic, atraumatic  Cardiovascular: Regular rhythm, tachycardic rate  Pulmonary: Clear to auscultation bilaterally, no wheezes or rales  GI: Abdomen soft, non-tender, non-distended (baseline habitus), bowel sounds +  Skin: No rashes or bruises, good turgor, cap refill <2s  Neuro:  Moves all extremities, follows commands  Musculoskeletal: No deformities  Lymphatic/extremities: Diffuse mild non-pitting edema    Laboratory:  Recent Labs     08/28/24  0425 08/28/24  1502 08/29/24  0402 08/29/24  1327 08/30/24  0259   NA 132* 132* 133* 134* 140   K 4.5 4.7 4.4 4.3 4.0   CL 102 100 101 102 105   CO2 19* 21 21 21 22    GAP 11 11 11 11  13*   BUN 51* 58* 70* 81* 88*   CR 3.00* 3.40* 3.07* 3.44* 3.35*   GLU 152* 146* 144* 155* 128*   CA 9.0 9.2 9.4 9.2 9.8   ALBUMIN  3.1*  --  2.9*  --  2.9*   MG 2.0  --  2.2  --  2.3       Recent Labs     08/28/24  0425 08/29/24  0402 08/29/24  1327 08/30/24  0259   WBC 18.80* 15.90* 15.70* 12.80*   HGB 12.0* 10.8* 10.8* 10.2*   HCT 36.7* 32.7* 33.4* 30.9*   PLTCT 143* 149* 172 191   AST 19 13  --  15   ALT 3* 3*  --  4*   ALKPHOS 56 49  --  56      Estimated Creatinine Clearance: 38 mL/min (A) (by C-G formula based on SCr of 3.35 mg/dL (H)).  Vitals:    08/25/24 1509 08/26/24 1000 08/29/24 0400   Weight: (!) 144.2 kg (318 lb) (!) 144.2 kg (317 lb 14.5 oz) (!) 142.1 kg (313 lb 4.4 oz)      Recent Labs     08/29/24  0753 08/30/24  0316   PHART 7.26* 7.35   PO2ART 87 92         Pertinent radiology reviewed.    CHEST SINGLE VIEW   Final Result         Stable support devices.      Persistent small left pleural effusion with slight improved though unresolved left lower lobe consolidation.      Stable cardiomediastinal silhouette.          Finalized by Eleanor Don, M.D. on 08/29/2024 8:28 AM. Dictated by Eleanor Don, M.D. on 08/29/2024 8:27 AM.      FEEDING TUBE PLCMNT (ABD/CHEST LMTD)   Final Result   FINDINGS/IMPRESSION:      Gastric tube courses below the diaphragm with tip projecting over the gastric antrum.      Moderate gaseous distention of the stomach. Visualized bowel gas pattern nonobstructive.       Similar left lower lobe consolidation.      By my electronic signature, I attest that  I have personally reviewed the images for this examination and formulated the interpretations and opinions expressed in this report          Finalized by Omar Pinal, D.O. on 08/28/2024 10:20 AM. Dictated by Ozell Mall, DO on 08/28/2024 9:17 AM.      CHEST SINGLE VIEW   Final Result         Endotracheal tube with the tip approximately 8 cm above the carina. This could be slightly advanced for more optimal positioning.      Gastric tube courses below the diaphragm and out of the field-of-view.      Increasing left lower lobe consolidation which could be infection, aspiration, or worsening atelectasis.          Finalized by Eleanor Don, M.D. on 08/27/2024 12:25 PM. Dictated by Eleanor Don, M.D. on 08/27/2024 12:23 PM.      FEEDING TUBE PLCMNT (ABD/CHEST LMTD)   Final Result         Gastric tube with sidehole projecting over the body of the stomach and tip projecting at the gastric antrum.       The stomach is mildly distended.       Patchy bibasilar opacities are better demonstrated on recent CT chest.      By my electronic signature, I attest that I have personally reviewed the images for this examination and formulated the interpretations and opinions expressed in this report          Finalized by Christobal SHAUNNA Sawyer, MD on 08/26/2024 1:35 PM. Dictated by Mont Lay, MD on 08/26/2024 1:28 PM.      US  VENOUS DOPPLER BILATERAL   Final Result         1.  Acute appearing nonocclusive thrombus in the left popliteal vein.      2.  No femoral/popliteal deep venous thrombosis in right lower extremity.      By my electronic signature, I attest that I have personally reviewed the images for this examination and formulated the interpretations and opinions expressed in this report          Finalized by Allean Pouch, M.D. on 08/26/2024 11:24 AM. Dictated by Mont Lay, MD on 08/26/2024 11:14 AM.      US  EXTREMITY LIMITED RIGHT   Final Result         1.  Acute appearing nonocclusive thrombus in the left popliteal vein.      2.  No femoral/popliteal deep venous thrombosis in right lower extremity.      By my electronic signature, I attest that I have personally reviewed the images for this examination and formulated the interpretations and opinions expressed in this report          Finalized by Allean Pouch, M.D. on 08/26/2024 11:24 AM. Dictated by Mont Lay, MD on 08/26/2024 11:14 AM.      CTA CHEST PULM EMBOLISM W/CONT   Final Result         CTA CHEST:      1. Multiple bilateral pulmonary emboli as described above, right greater than left. Elevated RV: LV ratio consistent with right heart strain, with dilatation of the right atrium, right ventricle and main pulmonary artery. Borderline cardiomegaly.   2. Moderate left lower lobe with additional areas of mild bilateral atelectasis, without significant pulmonary infarct or pleural effusion.      CT abdomen and pelvis:      1. Postsurgical changes of reported recent left robotic renal cyst decortication, with left perinephric stranding and  multifocal gas. Small amount of free air is noted and likely postoperative. Surgical drain extending to the left lateral perinephric region, with no abdominal or pelvic fluid collection.   2. Redemonstration of marked bilateral renal enlargement with innumerable cysts, consistent with autosomal dominant polycystic kidney disease. 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   3. Bladder is decompressed about a Foley catheter, limiting evaluation.   4. Colonic diverticulosis without acute diverticulitis or bowel obstruction.   5. Trace pelvic free fluid, nonspecific though possibly postoperative.      Major preliminary findings including presence of extensive pulmonary emboli with right heart strain was conveyed to Dr. Iannazzo by Dairl messenger at 4:34 PM on 08/25/2024.          Finalized by Norleen Pane, M.D. on 08/25/2024 4:35 PM. Dictated by Norleen Pane, M.D. on 08/25/2024 4:12 PM.      CT ABD/PELV W CONTRAST   Final Result         CTA CHEST:      1. Multiple bilateral pulmonary emboli as described above, right greater than left. Elevated RV: LV ratio consistent with right heart strain, with dilatation of the right atrium, right ventricle and main pulmonary artery. Borderline cardiomegaly.   2. Moderate left lower lobe with additional areas of mild bilateral atelectasis, without significant pulmonary infarct or pleural effusion.      CT abdomen and pelvis:      1. Postsurgical changes of reported recent left robotic renal cyst decortication, with left perinephric stranding and multifocal gas. Small amount of free air is noted and likely postoperative. Surgical drain extending to the left lateral perinephric region, with no abdominal or pelvic fluid collection.   2. Redemonstration of marked bilateral renal enlargement with innumerable cysts, consistent with autosomal dominant polycystic kidney disease. 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   3. Bladder is decompressed about a Foley catheter, limiting evaluation.   4. Colonic diverticulosis without acute diverticulitis or bowel obstruction.   5. Trace pelvic free fluid, nonspecific though possibly postoperative.      Major preliminary findings including presence of extensive pulmonary emboli with right heart strain was conveyed to Dr. Iannazzo by Dairl messenger at 4:34 PM on 08/25/2024.          Finalized by Norleen Pane, M.D. on 08/25/2024 4:35 PM. Dictated by Norleen Pane, M.D. on 08/25/2024 4:12 PM.      2D + DOPPLER ECHO   Final Result      CHEST SINGLE VIEW   Final Result   FINDINGS/IMPRESSION:        Support Devices: Endotracheal tube has been slightly advanced, with the tip now projecting 3 cm above the carina. This could be slightly advanced for optimal positioning.      Lungs/Pleura: Inferior aspects of both lungs, particularly the left, are excluded from the field-of-view,. Patchy bibasilar opacities, likely atelectasis. No pneumothorax.      Heart and Mediastinum: The cardiomediastinal silhouette is stable.             Finalized by JOSETTE BEAGLE, M.D. on 08/25/2024 12:48 PM. Dictated by JOSETTE BEAGLE, M.D. on 08/25/2024 12:46 PM.      CHEST SINGLE VIEW   Final Result   FINDINGS/IMPRESSION:        Support Devices: Endotracheal tube in place, with the tip projecting 0.5 cm above the carina. Advancement is recommended..      Lungs/Pleura: Inferior aspect of the left lung, and inferior  right costophrenic angle are excluded from the film.SABRA Patchy bibasilar opacities, likely atelectasis. Mild pulmonary edema.. No pneumothorax.SABRA      Heart and Mediastinum: Cardiac silhouette is incompletely visualized, although likely within normal limits.          Finalized by JOSETTE BEAGLE, M.D. on 08/25/2024 12:49 PM. Dictated by JOSETTE BEAGLE, M.D. on 08/25/2024 12:48 PM.           Scheduled Meds:[Held by Provider] allopurinoL  (ZYLOPRIM ) tablet 100 mg, 100 mg, Oral, QDAY  apixaban  (ELIQUIS ) tablet 10 mg, 10 mg, Oral, BID   Followed by  NOREEN ON 09/02/2024] apixaban  (ELIQUIS ) tablet 5 mg, 5 mg, Oral, BID  [START ON 08/31/2024] atorvastatin  (LIPITOR) tablet 10 mg, 10 mg, Per NG tube, QDAY  bacitracin  zinc  topical ointment, , Topical, BID  insulin  aspart (U-100) (NOVOLOG  FLEXPEN U-100 INSULIN ) injection PEN 0-6 Units, 0-6 Units, Subcutaneous, Q6H  pantoprazole  (PROTONIX ) injection 40 mg, 40 mg, Intravenous, QDAY  petrolatum  (STYE) ophthalmic ointment 0.25 inch, 0.25 inch, Both Eyes, Q6H  piperacillin /tazobactam (ZOSYN ) 4.5 g in sodium chloride  0.9% (NS) 100 mL IVPB (MB+)(EXTENDED INFUSION), 4.5 g, Intravenous, Q6H*  [START ON 08/31/2024] polyethylene glycol 3350  (MIRALAX ) packet 17 g, 1 packet, Feeding Tube, QDAY  Protein Supplement Packets, , SEE ADMIN INSTRUCTIONS, TID  [START ON 08/31/2024] sennosides-docusate sodium  (SENOKOT-S) tablet 1 tablet, 1 tablet, Per OG Tube, QDAY  sodium chloride  (NEBUSAL) 3 % nebulizer solution 4 mL, 4 mL, Inhalation, BID  vancomycin  (VANCOCIN ) 1,500 mg in sodium chloride  0.9% 250 mL IVPB (Vial/Bag Adapter), 1,500 mg, Intravenous, ONCE  vancomycin , random dosing, 1 each, Intravenous, Random Dosing    Continuous Infusions:   dexMEDEtomidine  (PRECEDEX ) 400 mcg/NS 100 ml IV drip (premade) 0.5 mcg/kg/hr (08/30/24 1158)    Diet Critical Care Enteral Feeding Volume Based Infusion 35 mL/hr at 08/28/24 1002    fentaNYL  (SUBLIMAZE ) 1000 mcg/100 mL NS IV drip (std conc)(premade) 30 mcg/hr (08/30/24 0849)    norepinephrine  (LEVOPHED ) 16 mg in dextrose  5% (D5W) 250 mL IV drip (quad conc) 0.02 mcg/kg/min (08/30/24 1401)    sodium chloride  0.9% TKO infusion 5 mL/hr at 08/26/24 1026     PRN and Respiratory Meds:acetaminophen  Q6H PRN, albuterol -ipratropium Q4H PRN, dextrose  50% PRN, fentaNYL  TITRATE **AND** fentaNYL  Q30 MIN PRN, pancrelipase  20,880 Units/sodium bicarbonate  650 mg (Kennett CLOG DESTROYER) PRN (On Call from Rx), vancomycin , pharmacy to manage Per Pharmacy      Malnutrition Details:              Loss of Subcutaneous Fat: No      Muscle Wasting: No                  Active Wounds                                              [1]   Social History  Socioeconomic History    Marital status: Divorced   Tobacco Use    Smoking status: Never    Smokeless tobacco: Former     Types: Chew     Quit date: 1990    Tobacco comments:     Chewed x10 years   Vaping Use    Vaping status: Never Used   Substance and Sexual Activity    Alcohol use: Yes     Alcohol/week: 2.0 standard drinks of alcohol     Types: 2 Cans of beer per  week     Comment: Occasional    Drug use: Never   [2]   Current Facility-Administered Medications:     acetaminophen  oral solution 650 mg, 650 mg, Oral, Q6H PRN, Jobe, Alan CROME, MD, 650 mg at 08/30/24 0055    albuterol -ipratropium (DUONEB) nebulizer solution 3 mL, 3 mL, Inhalation, Q4H PRN, Iannazzo, Emily G, DO, 3 mL at 08/29/24 1307    [Held by Provider] allopurinoL  (ZYLOPRIM ) tablet 100 mg, 100 mg, Oral, QDAY, Marty Toribio SAUNDERS, MD, 100 mg at 08/25/24 9062    apixaban  (ELIQUIS ) tablet 10 mg, 10 mg, Oral, BID, 10 mg at 08/30/24 1139 **FOLLOWED BY** [START ON 09/02/2024] apixaban  (ELIQUIS ) tablet 5 mg, 5 mg, Oral, BID, Iannazzo, Emily G, DO    [START ON 08/31/2024] atorvastatin  (LIPITOR) tablet 10 mg, 10 mg, Per NG tube, QDAY, Iannazzo, Emily G, DO    bacitracin  zinc  topical ointment, , Topical, BID, Marty Toribio SAUNDERS, MD, Given at 08/30/24 1014    dexMEDEtomidine  (PRECEDEX ) 400 mcg/NS 100 ml IV drip (premade), 0.2-1 mcg/kg/hr (Dosing Weight), Intravenous, TITRATE, Iannazzo, Emily G, DO, Last Rate: 18 mL/hr at 08/30/24 1158, 0.5 mcg/kg/hr at 08/30/24 1158    dextrose  50% (D50) syringe 25-50 mL, 12.5-25 g, Intravenous, PRN, Yoshiaki Kreuser, MD    Diet Critical Care Enteral Feeding Volume Based Infusion, , SEE ADMIN INSTRUCTIONS, Continuous, Odessia Asleson, MD, Last Rate: 35 mL/hr at 08/28/24 1002, New Bag at 08/28/24 1002    fentaNYL  (SUBLIMAZE ) 1000 mcg/100 mL NS IV drip (std conc)(premade), 10-70 mcg/hr, Intravenous, TITRATE, Last Rate: 3 mL/hr at 08/30/24 0849, 30 mcg/hr at 08/30/24 0849 **AND** fentaNYL  (SUBLIMAZE ) BOLUS for continuous infusion, 25 mcg, Intravenous, Q30 MIN PRN, Jackquline Castleman, MD, 25 mcg at 08/30/24 1156    insulin  aspart (U-100) (NOVOLOG  FLEXPEN U-100 INSULIN ) injection PEN 0-6 Units, 0-6 Units, Subcutaneous, Q6H, Agapito Ned, DO    norepinephrine  (LEVOPHED ) 16 mg in dextrose  5% (D5W) 250 mL IV drip (quad conc), 0-0.5 mcg/kg/min, Intravenous, TITRATE, Jackquline Castleman, MD, Last Rate: 2.5 mL/hr at 08/30/24 1401, 0.02 mcg/kg/min at 08/30/24 1401    pancrelipase  20,880 Units/sodium bicarbonate  650 mg (Rector CLOG DESTROYER), , Feeding Tube, PRN (On Call from Rx), Martin Belling, MD    pantoprazole  (PROTONIX ) injection 40 mg, 40 mg, Intravenous, QDAY, Provider, Pharmacy, 40 mg at 08/30/24 9166    petrolatum  (STYE) ophthalmic ointment 0.25 inch, 0.25 inch, Both Eyes, Q6H, Jobe, Amanda L, MD, 0.25 inch at 08/30/24 1153    piperacillin /tazobactam (ZOSYN ) 4.5 g in sodium chloride  0.9% (NS) 100 mL IVPB (MB+)(EXTENDED INFUSION), 4.5 g, Intravenous, Q6H*, Iannazzo, Emily G, DO, Last Rate: 33 mL/hr at 08/30/24 1137, 4.5 g at 08/30/24 1137    [START ON 08/31/2024] polyethylene glycol 3350  (MIRALAX ) packet 17 g, 1 packet, Feeding Tube, QDAY, Iannazzo, Emily G, DO    Protein Supplement Packets, , SEE ADMIN INSTRUCTIONS, TID, Aprel Egelhoff, MD, Given at 08/30/24 1141    [START ON 08/31/2024] sennosides-docusate sodium  (SENOKOT-S) tablet 1 tablet, 1 tablet, Per OG Tube, QDAY, Iannazzo, Emily G, DO    sodium chloride  (NEBUSAL) 3 % nebulizer solution 4 mL, 4 mL, Inhalation, BID, Iannazzo, Emily G, DO, 4 mL at 08/30/24 0840    sodium chloride  0.9% TKO infusion, , Intravenous, Continuous, Jobe, Amanda L, MD, Last Rate: 5 mL/hr at 08/26/24 1026, New Bag at 08/26/24 1026    vancomycin  (VANCOCIN ) 1,500 mg in sodium chloride  0.9% 250 mL IVPB (Vial/Bag Adapter), 1,500 mg, Intravenous, ONCE, Provider, Pharmacy, Last Rate: 166.7 mL/hr at 08/30/24 1344, 1,500 mg  at 08/30/24 1344    vancomycin , pharmacy to manage, 1 each, Service, Per Pharmacy, Provider, Pharmacy    vancomycin , random dosing, 1 each, Intravenous, Random Dosing, Provider, Pharmacy

## 2024-08-30 NOTE — Progress Notes [1]
 Block Charting    1. Time of block charting period: Start: 0240 Stop: 0304    2. Indication: Rapid resuscitation/emergent phase of care    3. Titration Summary (all infusions are titrated by parameter listed in order):    Drug Name Max Dose given during block charting event   Norepinephrine  (mcg/kg/min) 0.22     4. For starting and ending rates prior to and at the end of block charting event, see dose/rate verify documentation MAR    5. Summary of additional events/interventions (if applicable):     Patient's blood pressure suddenly dropped to 60s/50s with a MAP in the 40s at approx 0240. Levo rapidly titrated up to maintain MAP of 65. HR maintaining in 80-90s. This RN assessed patient's abdomen and JP drain and found no significant differences between prior assessment over the night. Provider Belvie Bison notified and arrived to bedside, and at that time, patient began responding to levo. Labs sent down but no new orders given at this time.

## 2024-08-30 NOTE — Progress Notes [1]
 Pharmacy Vancomycin  Note  Subjective:   Matthew Paul is a 58 y.o. male being treated for PNA.    Assessment:   Target levels for this patient:  1.  AUC (mcg*h/mL):  400-600  2.  Trough (mcg/mL): 15-20    Evaluation of AUC and/or level(s): 24 hour level within an appropriate range to redose    Plan:   Give vancomycin  1500 mg once and continue random dosing  Next scheduled level(s): TBD  Pharmacy will continue to monitor and adjust therapy as needed.      Objective:   Drug Levels:  Vancomycin  Timed   Date/Time Value Ref Range Status   08/30/2024 1000 16.8 <=30.0 ?g/mL Final       Current Vancomycin  Orders   Medication Dose Route Frequency    vancomycin  (VANCOCIN ) 1,500 mg in sodium chloride  0.9% 250 mL IVPB (Vial/Bag Adapter)  1,500 mg Intravenous ONCE    vancomycin , pharmacy to manage  1 each Service Per Pharmacy    vancomycin , random dosing  1 each Intravenous Random Dosing       Recent Vancomycin  Dosing and Administration:  Recent Vancomycin  Admin                     vancomycin  (VANCOCIN ) 1,500 mg in sodium chloride  0.9% 250 mL IVPB (Vial/Bag Adapter) (mg) 1,500 mg New Bag 08/29/24 0944    vancomycin  (VANCOCIN ) 2,500 mg in sodium chloride  0.9% 550 mL IVPB (mg) 2,500 mg New Bag 08/27/24 1512                    Start date of vancomycin  therapy: 08/27/2024    White Blood Cells   Date/Time Value Ref Range Status   08/30/2024 0259 12.80 (H) 4.50 - 11.00 10*3/uL Final   08/29/2024 1327 15.70 (H) 4.50 - 11.00 10*3/uL Final   08/29/2024 0402 15.90 (H) 4.50 - 11.00 10*3/uL Final   08/28/2024 0425 18.80 (H) 4.50 - 11.00 10*3/uL Final     Creatinine   Date/Time Value Ref Range Status   08/30/2024 0259 3.35 (H) 0.40 - 1.24 mg/dL Final   88/80/7974 8672 3.44 (H) 0.40 - 1.24 mg/dL Final   88/80/7974 9597 3.07 (H) 0.40 - 1.24 mg/dL Final     Blood Urea Nitrogen   Date/Time Value Ref Range Status   08/30/2024 0259 88 (H) 7 - 25 mg/dL Final     Estimated CrCl: 38    Intake/Output Summary (Last 24 hours) at 08/30/2024 1316  Last data filed at 08/30/2024 1200  Gross per 24 hour   Intake 2765.23 ml   Output 4140 ml   Net -1374.77 ml      Actual Weight:  142.1 kg (313 lb 4.4 oz)    Jandel Patriarca, PHARMD  08/30/2024

## 2024-08-31 ENCOUNTER — Inpatient Hospital Stay: Admit: 2024-08-31 | Discharge: 2024-08-31 | Payer: BLUE CROSS/BLUE SHIELD

## 2024-08-31 LAB — CBC
~~LOC~~ BKR MCH: 31 pg — ABNORMAL HIGH (ref 26.0–34.0)
~~LOC~~ BKR MCHC: 33 g/dL (ref 32.0–36.0)
~~LOC~~ BKR MCV: 94 fL — ABNORMAL LOW (ref 80.0–100.0)
~~LOC~~ BKR MPV: 8.6 fL (ref 7.0–11.0)
~~LOC~~ BKR PLATELET COUNT: 189 10*3/uL — ABNORMAL LOW (ref 150–400)
~~LOC~~ BKR RDW: 15 % — ABNORMAL HIGH (ref 11.0–15.0)

## 2024-08-31 LAB — VANCOMYCIN TIMED LEVEL: ~~LOC~~ BKR VANCOMYCIN TIMED: 20 g/mL (ref <=30.0)

## 2024-08-31 LAB — POC GLUCOSE
~~LOC~~ BKR POC GLUCOSE: 131 mg/dL — ABNORMAL HIGH (ref 70–100)
~~LOC~~ BKR POC GLUCOSE: 100 mg/dL (ref 70–100)
~~LOC~~ BKR POC GLUCOSE: 84 mg/dL (ref 70–100)
~~LOC~~ BKR POC GLUCOSE: 96 mg/dL (ref 70–100)

## 2024-08-31 MED ORDER — LOPERAMIDE 2 MG PO CAP
2 mg | Freq: Once | ORAL | 0 refills | Status: CP
Start: 2024-08-31 — End: ?
  Administered 2024-08-31: 21:00:00 2 mg via ORAL

## 2024-08-31 MED ORDER — VANCOMYCIN 1,250 MG IVPB (VIAL/BAG ADAPTER)
1250 mg | Freq: Once | INTRAVENOUS | 0 refills | Status: CP
Start: 2024-08-31 — End: ?
  Administered 2024-09-01 (×2): 1250 mg via INTRAVENOUS

## 2024-08-31 NOTE — Case Mgmt DC Plan [600024]
 Case Management Progress Note    NAME:Daley Rayhan Groleau                          MRN: [id number]              DOB:1966-04-02          AGE: 58 y.o.  ADMISSION DATE: 08/24/2024             DAYS ADMITTED: LOS: 6 days      Today's Date: 08/31/2024    PLAN: Ongoing discharge planning    Expected Discharge Date: 09/05/2024   Is Patient Medically Stable: No, Please explain: intubated  Are there Barriers to Discharge? CTM    INTERVENTION/DISPOSITION:  Discharge Planning                 NCM reviewed pt's EMR and participated in MICU Huddle.    Attempting to extubate.   Will need PT/OT eval to help determine discharge needs.   Transportation              Does the Patient Need Case Management to Arrange Discharge Transport? (ex: facility, ambulance, wheelchair/stretcher, Medicaid, cab, other): No  Will the Patient Use Family Transport?: Yes  Transportation Name, Phone and Availability #1: [name]  [phone]  Support                 Info or Referral                 Positive SDOH Domains and Potential Barriers         Medication Needs                              Financial                 Legal                 Other                 Discharge Disposition                                             Katheryn Oram, RN MSN ONC  Nurse Case Manager   [employer]

## 2024-08-31 NOTE — Progress Notes [1]
 Pharmacy Vancomycin  Note  Subjective:   Matthew Paul is a 58 y.o. male being treated for PNA.    Assessment:   Target levels for this patient:  1.  AUC (mcg*h/mL):  400-600  2.  Trough (mcg/mL): 15-20    Evaluation of AUC and/or level(s): The level was obtained approximately 22 hrs following the prior dose and is at the upper limit for redosing. Will redose tonight to allow for some additional clearance. Of note hte patient's SCr has improved from yesterday but remains elevated. Do not anticipate that he will tolerate scheduled dosing yet    Plan:   Redose with vancomycin  1250 mg IV x 1 dose. Subsequent doses TBD pending renal function trend  Next scheduled level(s): To be determined  Pharmacy will continue to monitor and adjust therapy as needed.      Objective:     Drug Levels:  Vancomycin  Timed   Date/Time Value Ref Range Status   08/31/2024 1239 20.4 <=30.0 ?g/mL Final       Current Vancomycin  Orders   Medication Dose Route Frequency    vancomycin  (VANCOCIN ) 1,250 mg in sodium chloride  0.9% 250 mL IVPB (Vial/Bag Adapter)  1,250 mg Intravenous ONCE    vancomycin , pharmacy to manage  1 each Service Per Pharmacy    vancomycin , random dosing  1 each Intravenous Random Dosing       Recent Vancomycin  Dosing and Administration:  Recent Vancomycin  Admin                     vancomycin  (VANCOCIN ) 1,500 mg in sodium chloride  0.9% 250 mL IVPB (Vial/Bag Adapter) (mg) 1,500 mg New Bag 08/30/24 1344    vancomycin  (VANCOCIN ) 1,500 mg in sodium chloride  0.9% 250 mL IVPB (Vial/Bag Adapter) (mg) 1,500 mg New Bag 08/29/24 0944    vancomycin  (VANCOCIN ) 2,500 mg in sodium chloride  0.9% 550 mL IVPB (mg) 2,500 mg New Bag 08/27/24 1512                    Start date of vancomycin  therapy: 08/27/2024    White Blood Cells   Date/Time Value Ref Range Status   08/31/2024 0214 9.70 4.50 - 11.00 10*3/uL Final   08/30/2024 0259 12.80 (H) 4.50 - 11.00 10*3/uL Final   08/29/2024 1327 15.70 (H) 4.50 - 11.00 10*3/uL Final   08/29/2024 0402 15.90 (H) 4.50 - 11.00 10*3/uL Final     Creatinine   Date/Time Value Ref Range Status   08/31/2024 0214 3.04 (H) 0.40 - 1.24 mg/dL Final   88/79/7974 9740 3.35 (H) 0.40 - 1.24 mg/dL Final   88/80/7974 8672 3.44 (H) 0.40 - 1.24 mg/dL Final     Blood Urea Nitrogen   Date/Time Value Ref Range Status   08/31/2024 0214 107 (H) 7 - 25 mg/dL Final     Estimated CrCl: 42    Intake/Output Summary (Last 24 hours) at 08/31/2024 1344  Last data filed at 08/31/2024 1300  Gross per 24 hour   Intake 2468.17 ml   Output 3300 ml   Net -831.83 ml      Actual Weight:  142.1 kg (313 lb 4.4 oz)    Gaile Allmon, PHARMD  08/31/2024

## 2024-08-31 NOTE — Progress Notes [1]
 Urology Progress Note 09/01/2024     Assessment/Plan:    Matthew Paul is a 58 y.o. Male with ADPKD with enlarging bilateral renal size causing pressure and s/p LEFT robotic renal cyst decortication (11/14). Postoperative course complicated by pulmonary embolism resulting in cardiac arrest (11/15) with subsequent ROSC.       Will discuss plan with staff surgeon - Dr. Waddell for Dr. Erskin    - MICU primary; greatly appreciate assistance in management   - Diet: SLP evaluation 11/22 demonstrating mild-moderate oropharyngeal dysphagia - OK for full liquid/thin liquids  - Pain: Recommend MMPC with APAP, oxycodone  5 mg q4h  - CV/Pulm: Successfully titrated off pressor 11/20 PM. Extubated to [facility] 11/21.   >CXR 11/17: Increasing left lower lobe consolidation which could be infection, aspiration, or worsening atelectasis    >CXR 11/19: Slight improved left lower lobe consolidation  - Renal: Cr 2.43 (3.0)  - GU: Foley removal 11/22 AM; maintain JP  - Heme/ID: Hgb 10 (9.7),  WBC 11.2 (9.7), febrile to 100.4 11/21   > Febrile 11/17 - started on vanc/zosyn ; planning on abx through 11/24 for pneumonia   > Cultures negative to date  > Heparin  gtt transitioned to eliquis  11/20 per MICU team   >Ok for tPA from surgical perspective if necessary   - Urology to follow. Please page with questions or concerns.    Tinnie Berber, MD  Please page Urology on-call with questions.  __________________________________   SUBJECTIVE:   Intubated, sedated. No acute changes. Extubated yesterday, reports feeling significantly improved. Minimal abdominal pain. Passing gas and had a BM yesterday    OBJECTIVE:                   Vital Signs: Most Recent                Vital Signs: Past 24 Hours   BP: 149/78 (11/22 1000)  Temp: 37.3 ?C (99.2 ?F) (11/22 0800)  Pulse: 107 (11/22 1000)  Respirations: 14 PER MINUTE (11/22 1000)  SpO2: 94 % (11/22 1000)  O2 Device: None (Room air) (11/22 0900)  O2 Liter Flow: 1 Lpm (11/22 0800)  Weight: 135.2 kg (298 lb 1.6 oz) (11/21 1718)  BP: (98-157)/(52-78)   Temp:  [36.9 ?C (98.4 ?F)-38 ?C (100.4 ?F)]   Pulse:  [89-109]   Respirations:  [14 PER MINUTE-21 PER MINUTE]   SpO2:  [90 %-100 %]   O2 Device: None (Room air)  O2 Liter Flow: 1 Lpm       General: Alert, oriented. Conversive  Pulm: Normal work of breathing on 1L NC on my exam, per chart review now on RA  CV: Regular rate  Abd: Soft, non-distended; incisions c/d/I without erythema or purulence. JP ssang  Extremities: No edema; SCD's in place.    Date 08/31/24 0701 - 09/01/24 0700 09/01/24 0701 - 09/02/24 0700   Shift 0701-1900 1901-0700 24 Hour Total 0701-1900 1901-0700 24 Hour Total   INTAKE   P.O. 210  210      I.V.(mL/kg/hr) 259.4(0.2)  259.4(0.1)      Other 404 75 479 50  50   I.V.P.B./FLUSHES  250 250      Shift Total(mL/kg) 873.4(6.5) 325(2.4) 1198.4(8.9) 50(0.4)  50(0.4)   OUTPUT   Urine(mL/kg/hr) 7639(8.4) 3249(2) 5609(1.7) 430  430     Urine Output (mL) ([REMOVED] Indwelling Urinary Catheter 16 FR Standard 2-way) 2360 3249 5609 430  430     Urine Output (mL) (External Urinary Catheter Male urinary catheter  to suction)    0  0   Drains 55 30 85        Drain Output (mL) (Surgical Drain Bulb (e.g. JP) Left Abdomen #1) 55 30 85      Other           Stool Occurrence 4 x  4 x      Shift Total(mL/kg) 7584(82.0) 6720(75.6) 4305(57.8) 430(3.2)  430(3.2)   NET -1541.7 -2954 -4495.7 -380  -380   Weight (kg) 135.2 135.2 135.2 135.2 135.2 135.2       Labs:                     Hematology                               Chemistry   Recent Labs     09/01/24  0407   WBC 11.20*   HGB 10.0*   PLTCT 240    Recent Labs     09/01/24  0407   NA 149*   K 4.4   CL 118*   CO2 19*   BUN 84*   CR 2.43*   GFR 30*   GLU 101*   CA 9.6        Malnutrition Details:              Loss of Subcutaneous Fat: No      Muscle Wasting: No                  ACTIVE PROBLEMS:  Principal Problem:    Acquired bilateral renal cysts  Active Problems:    ADPKD (autosomal dominant polycystic kidney disease) Cardiac arrest (CMS-HCC)    Acute kidney injury superimposed on CKD    Obstructive cardiovascular shock (CMS-HCC)    Lactic acidosis    High anion gap metabolic acidosis    Acute pulmonary embolism with acute cor pulmonale (CMS-HCC)    Acute respiratory failure with hypoxia (CMS-HCC)

## 2024-08-31 NOTE — Progress Notes [1]
 CLINICAL NUTRITION                                                        Clinical Nutrition Follow-Up Assessment     Name: Matthew Paul   MRN: [id number]     DOB: 1966-03-10      Age: 58 y.o.  Admission Date: 08/24/2024     LOS: 6 days     Date of Service: 08/31/2024        Recommendation:  IF EN warranted post extubation, REC Jevity 1.5 at goal rate 40ml/hr + 1 prosource pack/d. Provides 2600 kcal, 127g protein, and free water  daily. Water  boluses per team     Comments:  Pt was extubated this a.m. SLP swallow eval this showed mod-severe dysphagia and recs for non-oral nutrition.     Nutrition Assessment of Patient:  Admit Weight: 144.2 kg;  ; Desired Weight: 100 kg  BMI (Calculated): 35.81;    Pertinent Allergies/Intolerances: reviewed, none     Oral Diet Order: NPO;       Current Energy Intake: NPO  Estimated Calorie Needs: 2500 (25 kcal/kg DBW)  Estimated Protein Needs: 120-150 (1.2-1.5g/kg DBW 100 kg)    Malnutrition Assessment:  Does not meet criteria     Landry Mower, RD, LD, CNSC  Office  [phone]  Available via [employer]

## 2024-08-31 NOTE — Care Plan [600008]
 Problem: Discharge Planning  Goal: Participation in plan of care  Outcome: Goal Ongoing  Goal: Knowledge regarding plan of care  Outcome: Goal Ongoing  Goal: Prepared for discharge  Outcome: Goal Ongoing     Problem: Skin Integrity  Goal: Skin integrity intact  Outcome: Goal Ongoing  Goal: Healing of skin (Wound & Incision)  Outcome: Goal Ongoing  Goal: Healing of skin (Pressure Injury)  Outcome: Goal Ongoing     Problem: Glucose Management  Goal: Absence of hyperglycemia  Outcome: Goal Ongoing  Goal: Absence of Hypoglycemia  Outcome: Goal Ongoing  Goal: Glucose level within specified parameters  Outcome: Goal Ongoing     Problem: Moderate Fall Risk  Goal: Moderate Fall Risk  Outcome: Goal Ongoing

## 2024-08-31 NOTE — Progress Notes [1]
 MEDICAL INTENSIVE CARE UNIT  PROGRESS NOTE       Patient's Name:  Matthew Paul MRN: [id number]   Patient's DOB: November 25, 1965   Today's Date:  08/31/2024  Admission Date: 08/24/2024  LOS: 6 days    Problem list  Principal Problem:    Acquired bilateral renal cysts  Active Problems:    ADPKD (autosomal dominant polycystic kidney disease)    Cardiac arrest (CMS-HCC)    Acute kidney injury superimposed on CKD    Obstructive cardiovascular shock (CMS-HCC)    Lactic acidosis    High anion gap metabolic acidosis    Acute pulmonary embolism with acute cor pulmonale (CMS-HCC)    Acute respiratory failure with hypoxia (CMS-HCC)      HOSPITAL COURSE SUMMARY     Matthew Paul is a 58 y.o. 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  Patient admitted initially on 11/14 for planned left robotic renal cyst decortication and cystoscopy with ureteral stent placement.  No complications during procedure and patient had existing JP drain placed.  Unfortunately, patient suffered PEA cardiac arrest on 11/15 with ROSC following 2 rounds of epinephrine .    Patient was transported to the ICU on 11/15 and initially required high vent settings [PEEP 10, FiO2 100%] and pressor support with NE and vaso.  Cardiology consulted for abnormal EKG with new RBBB.  Mild metabolic derangements on initial labs without electrolyte disturbances.  Formal TTE stat with EF 55%, grade 1 diastolic dysfunction, and moderate to severely dilated LV with PASP 34.  CTA chest 11/15 showing massive bilateral PE, right greater than left with significant RV strain.  CT AP without evidence of hemorrhage or acute abdominal pathology.  After much discussion with family regarding risk/benefit, started on therapeutic heparin  drip 11/15. The patient continued to be intubated and sedated due to high ventilation requirements over the next several days. Antibiotics were begun on 11/17 given fever and possible pneumonia on CXR. Switched from heparin  gtt to eliquis  on 11/20.  He was extubated on 11/21 and no longer required pressors.     Interval update 08/31/2024:  > Extubated today  > Continue pulmonary hygiene with incentive spirometry, duonebs, and sodium chloride  3% nebulizers for airway clearance given thick secretions  > SLP evaluated, NPO for now, expect improvement in swallow strength in coming days    ASSESSMENT & PLAN     NEURO/PSYCH  #No acute concerns  - Initially post-code: moving all extremities, opening eyes, attempting to pull at tube prior to sedation   PLAN:  > A+O x4 post extubation    -----------------------------------------------------------------------------------------------------------------------------------------------------------------------------------------------------------------------  PULMONARY  #Mechanical intubation for airway protection s/p extubation 11/21  #Bilateral massive PE  - Intubated on 11/15 post PEA arrest. ETT advanced to 28 at the teeth   - Bedside POCUS concerning for RV overload and McConnell's sign.   - TTE: RV overload further c/f PE  - CTA Chest: massive multiple BL PE, R>L. Elevated RV:LV c/w right heart strain w/ dilation of RA, RV, main PA  - Result of long discussion with family, pharmacy, interventional radiology, regarding overall multidisciplinary approach and decision for tPA versus heparin  was to initiate heparin .  Discussed risks/benefit with family at length especially given his recent surgical procedure yesterday and existing JP drain, along with potential for existing cerebral aneurysms that are often associated with autosomal dominant PKD.  They understand  this and would like to continue forward with heparin  GGT.    PLAN:  > Extubated today  > Continue pulmonary hygiene with IPV qid, duonebs, and sodium chloride  3% nebulizers for airway clearance given thick secretions  > Continue eliquis  10 mg BID through 11/24, then 5 mg BID     #Suspected left lower lobe pneumonia  - MRSA nares +  - 11/17 CXR: Increasing left lower lobe consolidation which could be infection, aspiration, or worsening atelectasis.   - s/p 4 day course IV steroids  PLAN:  > Continue vancomycin  and zosyn  (11/17-) to treat pneumonia empirically due to fever and potential pneumonia on CXR, plan for 7 day course   > MRSA nares +, continue vanc   > Sputum GS with 10-25/LPF neutrophils, many mixed bacteria, sputum cx with normal oropharyngeal flora  > Incentive spirometry, duonebs, and sodium chloride  3% nebulizers for airway clearance    -----------------------------------------------------------------------------------------------------------------------------------------------------------------------------------------------------------------------  CARDIOVASCULAR  #PEA arrest   #Shock - obstructive 2/2 PE , resolved  #Remote history of Afib (1980s) not on AC   #Volume Overload  - PEA arrest on 11/15, ROSC after 2 rounds of epi and total code lasting 4-6 minutes.  - PTA: ASA 81 mg daily  - EKG:               Previous OSH 2020: SR, Qtc 406               EKG (11/15 post code): Sinus tachy 100s, Qtc 452, new RBBB  - Pressors: NE, vaso   - Echo (11/15): EF 55%. No dia dysfunction. Mod to severely enlarged RV w/ intraventricular septal flattening. PASP 35 + CVP. No major valvular abnormalities.   - CTA Chest: massive bl PE with RV strain.   - CT AP: normal post-surgical changes.   - Trop: 138   PLAN:  > PEA arrest due to obstructive shock from massive bilateral PE.  He has a new right bundle branch block, likely due to this.  Discussed with interventional radiology, not amenable to thrombectomy at this time due to location.    > Norepi stopped on 11/21  > Hold on further diuresis given worsening kidney function    #HLD   - PTA: atorvastatin  20 mg   - Lipid profile: chol 124, TG 208, HDL 33, LDL 82  PLAN:  > Continue atorvastatin  #HTN  - PTA meds: HCTZ 25 mg qAM, lisinopril  20 mg daily  PLAN:  > HOLD HCTZ and lisinopril  - due to hypotension    -----------------------------------------------------------------------------------------------------------------------------------------------------------------------------------------------------------------------  FEN/GI  #Elevated LFTs, likely post-code, resolved  #GERD  #Constipation, resolved  - Post code LFTs AST 157 (13 on admit), ALT 163 (12 on admit). Tbili 0.4.  - PTA meds: omeprazole 40 mg daily   PLAN:  > Daily CMP   > SLP evaluated, NPO for now, expect improvement in swallow in coming days, will hold off placing NG tube for now  > Continue PPI daily  > Bowel regimen PRN, Imodium  PRN due to frequent stools post extubation    -----------------------------------------------------------------------------------------------------------------------------------------------------------------------------------------------------------------------  RENAL  #ADPKD  #AKI on CKD   #HAGMA, mild likely 2/2 above further renal dysfunction  - PTA meds: jynarque  (valptan) 60 mg qAM / 30 mg qPM  - Baseline Cr 2.5 - 2.8   -11/14 underwent left robotic renal cyst decortication, cystoscopy, L ureteral stent placement for enlarging bilateral renal size causing pressure and discomfort in the setting of ADPKD.  Total of roughly 100 cysts were decorticated with  19 large cysts sent for pathology.  Placement of left ureteral stent, confirmation prior to surgical completion that left ureter had no urine leak.  No overt post surgical complications.  - CT AP (11/15): normal post-operative changes  Recent Labs     08/29/24  1327 08/30/24  0259 08/31/24  0214   CR 3.44* 3.35* 3.04*   BUN 81* 88* 107*   GFR 20* 20* 23*     - Net IO Since Admission: 2,088.18 mL [08/31/24 1521]  -   Intake/Output Summary (Last 24 hours) at 08/31/2024 1521  Last data filed at 08/31/2024 1500  Gross per 24 hour   Intake 2067.72 ml   Output 3620 ml   Net -1552.28 ml   PLAN:  > JP drain in place with ~ 170 mL out in the last 24 hours  > Maintain foley catheter for now, will follow up with urology for their recs  > Continue strict I's and O's, avoid further nephrotoxic agents, renally dose medications as able.      -----------------------------------------------------------------------------------------------------------------------------------------------------------------------------------------------------------------------  ENDOCRINE  #No acute concerns   - Invalid input(s): GLUCOSE  Recent Labs     08/29/24  1741 08/29/24  2326 08/30/24  0513 08/30/24  1153 08/30/24  1756 08/31/24  0012 08/31/24  0507 08/31/24  1238   GLUPOC 129* 117* 148* 147* 131* 131* 96 100   PLAN:  > CTM  > LDCF      -----------------------------------------------------------------------------------------------------------------------------------------------------------------------------------------------------------------------  HEME/ONC  #DVT/PE  #Normocytic Anemia  #Thrombocytopenia  - 11/16 doppler BLE: acute appearing nonocclusive thrombus in L popliteal vein  Recent Labs     08/29/24  1327 08/30/24  0259 08/31/24  0214   HGB 10.8* 10.2* 9.7*   MCV 95.2 94.1 94.9   PLTCT 172 191 189   PLAN:  > Monitor HGB, Transfuse if Hgb <7 and PLT < 10   > Continue eliquis  10 mg BID through 11/24, then 5 mg BID   > No evidence of acute bleeding. Ongoing monitor for s/s of bleeding.   > If significant HD instability: obtain TEG, CBC STAT and if dropping hgb consider fast scan for eval of retroperitoneal bleed/hematoma. At this time, no concerns for such.     #Leukocytosis, likely reactive, resolved  - Initial leukocytosis downtrended, consistent with reactive leukocytosis due to surgery/cardiac arrest, until elevation on 11/18  PLAN:   > Likely due to initiation of steroids on 11/17, continue to monitor with daily CBC  -----------------------------------------------------------------------------------------------------------------------------------------------------------------------------------------------------------------------  ID  #Febrile  #Suspected Left Lower Lobe Pneumonia  - Last febrile temperature at 2000 on 11/17  - MRSA nares +  - 11/17 CXR: Increasing left lower lobe consolidation which could be infection, aspiration, or worsening atelectasis.   - s/p 4 day course IV steroids   Recent Labs     08/29/24  1327 08/30/24  0259 08/31/24  0214   WBC 15.70* 12.80* 9.70     - Temp (24hrs), Avg:37.5 ?C (99.5 ?F), Min:37.2 ?C (99 ?F), Max:38 ?C (100.4 ?F)      Culture Data Date collected Result   Blood cx 11/17 NG x 4d   Sputum cx 11/17 Normal oropharyngeal flora   Urine cx 11/17 NG x 24h   Blood cx 11/20 NG x24h     Antimicrobial First dose Last dose Comment   Ancef  11/14 11/14 X2, for perioperative abx   Vancomycin  11/17     Zosyn  11/17       PLAN:  > Follow cultures as above  >  Continue zosyn  and vancomycin  to treat pneumonia empirically due to fever and possible pneumonia on CXR. Plan for 7 day course (11/17-11/23)  > Tylenol  PRN for fever    ----------------------------------------------------------------------------------------------------------------------------------------------------------------------------------------------------------------------  MSK/DERM  #Gout   PLAN:  > Hold PTA allopurinol , colchicine  0.6 mg prn for flares     -----------------------------------------------------------------------------------------------------------------------------------------------------------------------------------------------------------------------  LDA/PROPHYLAXIS  Patient Lines/Drains/Airways Status       Active Lines:       Name Placement date Placement time Site Days    Surgical Drain Bulb (e.g. JP) Left Abdomen #1 08/24/24  1733  -- 7    Indwelling Urinary Catheter 16 FR Standard 2-way 08/27/24  1100  -- 4 Peripheral IV 08/31/24 1342 Right Mid Upper Arm 20 G 08/31/24  1342  -- less than 1    Peripheral IV 08/31/24 1452 Left Mid Forearm 20 G 08/31/24  1452  -- less than 1                    Lines: PIV x2  Tubes: JP drain L abd (11/14 -)  Urinary Catheter:  Yes (11/15 -)  VTE ppx: SCDs, eliquis   GI:  PPI  Bowel regimen: miralax  and senna  Diet: Regular Diet  DIET NPO Ice Chips, Sips With Medications  Code status: Full Code     Patient case discussed with attending physician, Dr. Alan LITTIE Constable, MD     Ludie Overly, MD  Internal Medicine PGY-1   Available on Voalte    SUBJECTIVE     No acute events overnight. Matthew Paul was extubated this morning. He is alert and oriented x4 this afternoon. He reports that he is feeling generally poorly after extubation, but is doing alright overall.     Past Medical History:    Acid reflux    ADPKD (autosomal dominant polycystic kidney)    Apnea    Gout    Hyperlipidemia    Hypertension    Mild shortness of breath    Paroxysmal A-fib (CMS-HCC)     Surgical History:   Procedure Laterality Date    HX HERNIA REPAIR Right 1983    ROBOTIC LAPAROSCOPIC ABLATION RENAL CYSTS Right 06/08/2019    Performed by Erskin Lenis, MD at [facility] OR    ROBOT ASSISTED SURGERY N/A 06/08/2019    Performed by Erskin Lenis, MD at [facility] OR    CYSTOURETHROSCOPY WITH INDWELLING URETERAL STENT INSERTION Right 06/08/2019    Performed by Erskin Lenis, MD at [facility] OR    ROBOTIC ASSISTED LAPAROSCOPIC RENAL CYST DECORTICATION Left 10/02/2019    Performed by Erskin Lenis, MD at [facility] OR    CYSTOURETHROSCOPY WITH INDWELLING URETERAL STENT INSERTION Left 10/02/2019    Performed by Erskin Lenis, MD at [facility] OR    ABLATION, CYST, KIDNEY, LAPAROSCOPIC Left 08/24/2024    Performed by Erskin Lenis LABOR, MD at [facility] OR    ROBOT ASSISTED SURGERY Left 08/24/2024    Performed by Erskin Lenis LABOR, MD at [facility] OR    CYSTOURETHROSCOPY, WITH INDWELLING URETERAL STENT INSERTION Left 08/24/2024    Performed by Erskin Lenis LABOR, MD at [facility] OR    HX TONSILLECTOMY      KIDNEY CYST REMOVAL      Multiple    KNEE CARTILAGE SURGERY Bilateral      No family history on file.  Social History[1]  Vaping/E-liquid Use    Vaping Use Never User      Vaping/E-liquid Substances    CBD No     Nicotine No  Other No     Flavored No     THC No     Unknown No             Current Medications[2]       OBJECTIVE                          Vital Signs: Last Filed                 Vital Signs: 24 Hour Range   BP: 135/66 (11/21 1500)  Temp: 38 ?C (100.4 ?F) (11/21 1500)  Pulse: 109 (11/21 1500)  Respirations: 17 PER MINUTE (11/21 1500)  SpO2: 97 % (11/21 1500)  O2%: 40 % (11/21 0919)  O2 Device: High flow nasal cannula (11/21 1500)  O2 Liter Flow: 2 Lpm (11/21 1500) BP: (82-135)/(51-76)   Temp:  [37.2 ?C (99 ?F)-38 ?C (100.4 ?F)]   Pulse:  [65-109]   Respirations:  [10 PER MINUTE-22 PER MINUTE]   SpO2:  [93 %-98 %]   O2%:  [40 %]   O2 Device: High flow nasal cannula  O2 Liter Flow: 2 Lpm     Vitals:    08/25/24 1509 08/26/24 1000 08/29/24 0400   Weight: (!) 144.2 kg (318 lb) (!) 144.2 kg (317 lb 14.5 oz) (!) 142.1 kg (313 lb 4.4 oz)         Artificial Airway   None       Ventilator/Respiratory Therapy  None    Vent Weaning   Not applicable    Physical Exam  Constitutional: 58 y.o. male  A + O x4  Head: Normocephalic, atraumatic  Cardiovascular: Regular rhythm, tachycardic rate  Pulmonary: Clear to auscultation bilaterally, no wheezes or rales  GI: Abdomen soft, non-tender, non-distended (baseline habitus), bowel sounds +  Skin: No rashes or bruises, good turgor, cap refill <2s  Neuro:  Moves all extremities, no focal deficit  Musculoskeletal: No deformities  Lymphatic/extremities: Diffuse mild non-pitting edema    Laboratory:  Recent Labs     08/29/24  0402 08/29/24  1327 08/30/24  0259 08/31/24  0214   NA 133* 134* 140 144   K 4.4 4.3 4.0 3.9   CL 101 102 105 111*   CO2 21 21 22 23    GAP 11 11 13* 10   BUN 70* 81* 88* 107*   CR 3.07* 3.44* 3.35* 3.04*   GLU 144* 155* 128* 154*   CA 9.4 9.2 9.8 9.7   ALBUMIN  2.9*  --  2.9* 3.0*   MG 2.2  --  2.3 2.5       Recent Labs     08/29/24  0402 08/29/24  1327 08/30/24  0259 08/31/24  0214   WBC 15.90* 15.70* 12.80* 9.70   HGB 10.8* 10.8* 10.2* 9.7*   HCT 32.7* 33.4* 30.9* 29.3*   PLTCT 149* 172 191 189   AST 13  --  15 25   ALT 3*  --  4* 8   ALKPHOS 49  --  56 68      Estimated Creatinine Clearance: 41.9 mL/min (A) (by C-G formula based on SCr of 3.04 mg/dL (H)).  Vitals:    08/25/24 1509 08/26/24 1000 08/29/24 0400   Weight: (!) 144.2 kg (318 lb) (!) 144.2 kg (317 lb 14.5 oz) (!) 142.1 kg (313 lb 4.4 oz)      Recent Labs     08/30/24  0316 08/31/24  0214  PHART 7.35 7.33*   PO2ART 92 146*         Pertinent radiology reviewed.    CHEST SINGLE VIEW   Final Result         Stable support devices.      Persistent small left pleural effusion with slight improved though unresolved left lower lobe consolidation.      Stable cardiomediastinal silhouette.          Finalized by Eleanor Don, M.D. on 08/29/2024 8:28 AM. Dictated by Eleanor Don, M.D. on 08/29/2024 8:27 AM.      FEEDING TUBE PLCMNT (ABD/CHEST LMTD)   Final Result   FINDINGS/IMPRESSION:      Gastric tube courses below the diaphragm with tip projecting over the gastric antrum.      Moderate gaseous distention of the stomach. Visualized bowel gas pattern nonobstructive.       Similar left lower lobe consolidation.      By my electronic signature, I attest that I have personally reviewed the images for this examination and formulated the interpretations and opinions expressed in this report          Finalized by Omar Pinal, D.O. on 08/28/2024 10:20 AM. Dictated by Ozell Mall, DO on 08/28/2024 9:17 AM.      CHEST SINGLE VIEW   Final Result         Endotracheal tube with the tip approximately 8 cm above the carina. This could be slightly advanced for more optimal positioning.      Gastric tube courses below the diaphragm and out of the field-of-view.      Increasing left lower lobe consolidation which could be infection, aspiration, or worsening atelectasis.          Finalized by Eleanor Don, M.D. on 08/27/2024 12:25 PM. Dictated by Eleanor Don, M.D. on 08/27/2024 12:23 PM.      FEEDING TUBE PLCMNT (ABD/CHEST LMTD)   Final Result         Gastric tube with sidehole projecting over the body of the stomach and tip projecting at the gastric antrum.       The stomach is mildly distended.       Patchy bibasilar opacities are better demonstrated on recent CT chest.      By my electronic signature, I attest that I have personally reviewed the images for this examination and formulated the interpretations and opinions expressed in this report          Finalized by Christobal SHAUNNA Sawyer, MD on 08/26/2024 1:35 PM. Dictated by Mont Lay, MD on 08/26/2024 1:28 PM.      US  VENOUS DOPPLER BILATERAL   Final Result         1.  Acute appearing nonocclusive thrombus in the left popliteal vein.      2.  No femoral/popliteal deep venous thrombosis in right lower extremity.      By my electronic signature, I attest that I have personally reviewed the images for this examination and formulated the interpretations and opinions expressed in this report          Finalized by Allean Pouch, M.D. on 08/26/2024 11:24 AM. Dictated by Mont Lay, MD on 08/26/2024 11:14 AM.      US  EXTREMITY LIMITED RIGHT   Final Result         1.  Acute appearing nonocclusive thrombus in the left popliteal vein.      2.  No femoral/popliteal deep venous thrombosis in right lower extremity.  By my electronic signature, I attest that I have personally reviewed the images for this examination and formulated the interpretations and opinions expressed in this report          Finalized by Allean Pouch, M.D. on 08/26/2024 11:24 AM. Dictated by Mont Lay, MD on 08/26/2024 11:14 AM.      CTA CHEST PULM EMBOLISM W/CONT   Final Result         CTA CHEST:      1. Multiple bilateral pulmonary emboli as described above, right greater than left. Elevated RV: LV ratio consistent with right heart strain, with dilatation of the right atrium, right ventricle and main pulmonary artery. Borderline cardiomegaly.   2. Moderate left lower lobe with additional areas of mild bilateral atelectasis, without significant pulmonary infarct or pleural effusion.      CT abdomen and pelvis:      1. Postsurgical changes of reported recent left robotic renal cyst decortication, with left perinephric stranding and multifocal gas. Small amount of free air is noted and likely postoperative. Surgical drain extending to the left lateral perinephric region, with no abdominal or pelvic fluid collection.   2. Redemonstration of marked bilateral renal enlargement with innumerable cysts, consistent with autosomal dominant polycystic kidney disease. 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   3. Bladder is decompressed about a Foley catheter, limiting evaluation.   4. Colonic diverticulosis without acute diverticulitis or bowel obstruction.   5. Trace pelvic free fluid, nonspecific though possibly postoperative.      Major preliminary findings including presence of extensive pulmonary emboli with right heart strain was conveyed to Dr. Iannazzo by Dairl messenger at 4:34 PM on 08/25/2024.          Finalized by Norleen Pane, M.D. on 08/25/2024 4:35 PM. Dictated by Norleen Pane, M.D. on 08/25/2024 4:12 PM.      CT ABD/PELV W CONTRAST   Final Result         CTA CHEST:      1. Multiple bilateral pulmonary emboli as described above, right greater than left. Elevated RV: LV ratio consistent with right heart strain, with dilatation of the right atrium, right ventricle and main pulmonary artery. Borderline cardiomegaly.   2. Moderate left lower lobe with additional areas of mild bilateral atelectasis, without significant pulmonary infarct or pleural effusion.      CT abdomen and pelvis:      1. Postsurgical changes of reported recent left robotic renal cyst decortication, with left perinephric stranding and multifocal gas. Small amount of free air is noted and likely postoperative. Surgical drain extending to the left lateral perinephric region, with no abdominal or pelvic fluid collection.   2. Redemonstration of marked bilateral renal enlargement with innumerable cysts, consistent with autosomal dominant polycystic kidney disease. 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   3. Bladder is decompressed about a Foley catheter, limiting evaluation.   4. Colonic diverticulosis without acute diverticulitis or bowel obstruction.   5. Trace pelvic free fluid, nonspecific though possibly postoperative.      Major preliminary findings including presence of extensive pulmonary emboli with right heart strain was conveyed to Dr. Iannazzo by Dairl messenger at 4:34 PM on 08/25/2024.          Finalized by Norleen Pane, M.D. on 08/25/2024 4:35 PM. Dictated by Norleen Pane, M.D. on 08/25/2024 4:12 PM.      2D + DOPPLER ECHO   Final Result  CHEST SINGLE VIEW   Final Result   FINDINGS/IMPRESSION:        Support Devices: Endotracheal tube has been slightly advanced, with the tip now projecting 3 cm above the carina. This could be slightly advanced for optimal positioning.      Lungs/Pleura: Inferior aspects of both lungs, particularly the left, are excluded from the field-of-view,. Patchy bibasilar opacities, likely atelectasis. No pneumothorax.      Heart and Mediastinum: The cardiomediastinal silhouette is stable.             Finalized by JOSETTE BEAGLE, M.D. on 08/25/2024 12:48 PM. Dictated by JOSETTE BEAGLE, M.D. on 08/25/2024 12:46 PM.      CHEST SINGLE VIEW   Final Result   FINDINGS/IMPRESSION:        Support Devices: Endotracheal tube in place, with the tip projecting 0.5 cm above the carina. Advancement is recommended..      Lungs/Pleura: Inferior aspect of the left lung, and inferior right costophrenic angle are excluded from the film.SABRA Patchy bibasilar opacities, likely atelectasis. Mild pulmonary edema.. No pneumothorax.SABRA      Heart and Mediastinum: Cardiac silhouette is incompletely visualized, although likely within normal limits.          Finalized by JOSETTE BEAGLE, M.D. on 08/25/2024 12:49 PM. Dictated by JOSETTE BEAGLE, M.D. on 08/25/2024 12:48 PM.           Scheduled Meds:[Held by Provider] allopurinoL  (ZYLOPRIM ) tablet 100 mg, 100 mg, Oral, QDAY  apixaban  (ELIQUIS ) tablet 10 mg, 10 mg, Oral, BID   Followed by  NOREEN ON 09/02/2024] apixaban  (ELIQUIS ) tablet 5 mg, 5 mg, Oral, BID  atorvastatin  (LIPITOR) tablet 10 mg, 10 mg, Per NG tube, QDAY  bacitracin  zinc  topical ointment, , Topical, BID  insulin  aspart (U-100) (NOVOLOG  FLEXPEN U-100 INSULIN ) injection PEN 0-6 Units, 0-6 Units, Subcutaneous, Q6H  pantoprazole  (PROTONIX ) injection 40 mg, 40 mg, Intravenous, QDAY  petrolatum  (STYE) ophthalmic ointment 0.25 inch, 0.25 inch, Both Eyes, Q6H  piperacillin /tazobactam (ZOSYN ) 4.5 g in sodium chloride  0.9% (NS) 100 mL IVPB (MB+)(EXTENDED INFUSION), 4.5 g, Intravenous, Q6H*  polyethylene glycol 3350  (MIRALAX ) packet 17 g, 1 packet, Feeding Tube, QDAY  sennosides-docusate sodium  (SENOKOT-S) tablet 1 tablet, 1 tablet, Per OG Tube, QDAY  sodium chloride  (NEBUSAL) 3 % nebulizer solution 4 mL, 4 mL, Inhalation, BID  vancomycin  (VANCOCIN ) 1,250 mg in sodium chloride  0.9% 250 mL IVPB (Vial/Bag Adapter), 1,250 mg, Intravenous, ONCE  vancomycin , random dosing, 1 each, Intravenous, Random Dosing    Continuous Infusions:   norepinephrine  (LEVOPHED ) 16 mg in dextrose  5% (D5W) 250 mL IV drip (quad conc) Stopped (08/31/24 0758)    sodium chloride  0.9% TKO infusion 5 mL/hr at 08/31/24 0528     PRN and Respiratory Meds:acetaminophen  Q6H PRN, albuterol -ipratropium Q4H PRN, dextrose  50% PRN, pancrelipase  20,880 Units/sodium bicarbonate  650 mg ([facility] CLOG DESTROYER) PRN (On Call from Rx), vancomycin , pharmacy to manage Per Pharmacy      Malnutrition Details:              Loss of Subcutaneous Fat: No      Muscle Wasting: No                  Active Wounds                                                [1]   Social History  Socioeconomic History    Marital status: Divorced   Tobacco Use    Smoking status: Never    Smokeless tobacco: Former     Types: Chew     Quit date: 1990    Tobacco comments:     Chewed x10 years   Vaping Use    Vaping status: Never Used   Substance and Sexual Activity    Alcohol use: Yes     Alcohol/week: 2.0 standard drinks of alcohol     Types: 2 Cans of beer per week     Comment: Occasional    Drug use: Never   [2]   Current Facility-Administered Medications:     acetaminophen  oral solution 650 mg, 650 mg, Oral, Q6H PRN, Jobe, Alan CROME, MD, 650 mg at 08/31/24 1456    albuterol -ipratropium (DUONEB) nebulizer solution 3 mL, 3 mL, Inhalation, Q4H PRN, Iannazzo, Emily G, DO, 3 mL at 08/29/24 1307    [Held by Provider] allopurinoL  (ZYLOPRIM ) tablet 100 mg, 100 mg, Oral, QDAY, Marty Toribio SAUNDERS, MD, 100 mg at 08/25/24 9062    apixaban  (ELIQUIS ) tablet 10 mg, 10 mg, Oral, BID, 10 mg at 08/31/24 0800 **FOLLOWED BY** [START ON 09/02/2024] apixaban  (ELIQUIS ) tablet 5 mg, 5 mg, Oral, BID, Iannazzo, Emily G, DO    atorvastatin  (LIPITOR) tablet 10 mg, 10 mg, Per NG tube, QDAY, Iannazzo, Emily G, DO, 10 mg at 08/31/24 0800    bacitracin  zinc  topical ointment, , Topical, BID, Marty Toribio SAUNDERS, MD, Given at 08/31/24 0807    dextrose  50% (D50) syringe 25-50 mL, 12.5-25 g, Intravenous, PRN, Jordyn Hofacker, MD    insulin  aspart (U-100) (NOVOLOG  FLEXPEN U-100 INSULIN ) injection PEN 0-6 Units, 0-6 Units, Subcutaneous, Q6H, Agapito Ned, DO    norepinephrine  (LEVOPHED ) 16 mg in dextrose  5% (D5W) 250 mL IV drip (quad conc), 0-0.5 mcg/kg/min, Intravenous, TITRATE, Jackquline Castleman, MD, Paused at 08/31/24 0758    pancrelipase  20,880 Units/sodium bicarbonate  650 mg ([facility] CLOG DESTROYER), , Feeding Tube, PRN (On Call from Rx), Ananiah Maciolek, MD    pantoprazole  (PROTONIX ) injection 40 mg, 40 mg, Intravenous, QDAY, Provider, Pharmacy, 40 mg at 08/31/24 0800    petrolatum  (STYE) ophthalmic ointment 0.25 inch, 0.25 inch, Both Eyes, Q6H, Jobe, Amanda L, MD, 0.25 inch at 08/31/24 9490    piperacillin /tazobactam (ZOSYN ) 4.5 g in sodium chloride  0.9% (NS) 100 mL IVPB (MB+)(EXTENDED INFUSION), 4.5 g, Intravenous, Q6H*, Iannazzo, Emily G, DO, Last Rate: 33 mL/hr at 08/31/24 1231, 4.5 g at 08/31/24 1231    polyethylene glycol 3350  (MIRALAX ) packet 17 g, 1 packet, Feeding Tube, QDAY, Iannazzo, Emily G, DO, 17 g at 08/31/24 0800    sennosides-docusate sodium  (SENOKOT-S) tablet 1 tablet, 1 tablet, Per OG Tube, QDAY, Iannazzo, Emily G, DO, 1 tablet at 08/31/24 0800    sodium chloride  (NEBUSAL) 3 % nebulizer solution 4 mL, 4 mL, Inhalation, BID, Iannazzo, Emily G, DO, 4 mL at 08/31/24 9040    sodium chloride  0.9% TKO infusion, , Intravenous, Continuous, Jobe, Amanda L, MD, Last Rate: 5 mL/hr at 08/31/24 0528, New Bag at 08/31/24 0528    vancomycin  (VANCOCIN ) 1,250 mg in sodium chloride  0.9% 250 mL IVPB (Vial/Bag Adapter), 1,250 mg, Intravenous, ONCE, Provider, Pharmacy    vancomycin , pharmacy to manage, 1 each, Service, Per Pharmacy, Provider, Pharmacy    vancomycin , random dosing, 1 each, Intravenous, Random Dosing, Provider, Pharmacy

## 2024-08-31 NOTE — Progress Notes [1]
 Weaning Readiness Screen Met (RT Only):: Yes  SAT Safety Screen (Nursing Only): Pass-Perform spontaneous awakening trial  Initial Weaning Minute Volume (Lpm): 14.1 L/min  Initial Weaning Respiratory Rate: 13 breaths/min  Initial Weaning VT (mL)(calc): 1085 mL  Initial RSBI (Calculated): 12  Spontaneous Breathing Trial Completed (RT Only): Yes (Twice Daily)  Post Weaning Minute Volume (Lpm): 12.4 L/min  Post Weaning Respiratory Rate: 14 breaths/min  Post Weaning VT (mL)(calc): 886 mL  NIF Ventilated (cmH2O): -55 cm H2O  $$ Vital Capacity (mL): 1500 ml  Post RSBI (calc): 16  RSBI Ratio: 33.33  Spontaneous Breathing Trial Successful (Twice Daily): Yes  Patient Having Procedure Requiring Intubation in the Next 12 Hours?  (Twice Daily): No  Does Provider Want to Proceed with Extubation? (Twice Daily): Yes  Are Any Airway Risk Factors Present? (Twice Daily): No  Does Provider Want to Proceed with Extubation After Airway Risk Factors Reviewed? (Twice Daily): Yes  Are There Any Risk Factors for Extubation Failure? (Twice Daily): No

## 2024-08-31 NOTE — Progress Notes [1]
 SPEECH-LANGUAGE PATHOLOGY  CLINICAL SWALLOW ASSESSMENT     Name: Matthew Paul   MRN: [id number]     DOB: [birth year]      Age: 58 y.o.  Admission Date: 08/24/2024     LOS: 6 days     Date of Service: 08/31/2024        Evaluation Summary   Clinical swallow evaluation completed.   Clinical Impression: Moderate to severe dysphagia  Sources of Dysphagia: Recent intubation (11/15-11/21)       Recommendations  PO: Ice chips only  NPO: Consider short term non-oral nutrition  Medications: High priority, As tolerated, 1 at a time (with small sips of water  or in puree)  Ice Chip Trials: Unlimited  Supervision: Intermittent (for ice chips)  Positioning: Upright as tolerated  Oral Hygiene: 3-4 times per day, Complete oral care to minimize the risk of aspirating oral bacteria, Moistened oral swabs for oral comfort/moisture and to facilitate functional swallow          Ongoing Speech Therapy  Ongoing Speech Therapy to Address: Dysphagia      PO Presentation  Presentations: Therapist Fed, Patient Fed Self  Thin Liquid: 1 Tsp, 1 oz, Straw, Consecutive Swallows  Other Consistencies: Ice chips, Puree    Clinical Interpretation of Oral Stage  Withdraw Bolus: WFL  Form Bolus: WFL  Masticate Bolus: Did not trial masticated solids due to safety concerns (Comment)  Transfer Bolus: WFL  Anterior Bolus Spillage: None  Oral Residue: None    Clinical Interpretation of Pharyngeal Stage  Swallow Initiation: Timely  Laryngeal Elevation: Suspected to be adequate (present)  Signs / Symptoms Of Aspiration: Thin liquid, Puree  Thin Liquid: Cough, Wet vocal quality, Multiple swallows  Puree: Cough (x1)  Suspected Pharyngeal Stage Impairment: Incomplete pharyngeal clearance, Decreased laryngeal vestibule closure, Mistimed airway protection    Oral Mech Exam  Oral Mech WFL: No  Lips: WFL  Tongue: WFL  Buccal: WFL  Jaw: WFL  Vocal Quality: Hoarse  Dentition: Natural - Good    Attempted Swallow Strategies  Small Bites/Sips: Not effective  Slow Rate of Intake:  (Partially effective)    Subjective  Significant Hospital Events: Matthew Paul is a 58 y.o. 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  Patient admitted initially on 11/14 for planned left robotic renal cyst decortication and cystoscopy with ureteral stent placement.  No complications during procedure and patient had existing JP drain placed.  Unfortunately, patient suffered PEA cardiac arrest on 11/15 with ROSC following 2 rounds of epinephrine .     Patient was transported to the ICU on 11/15 and initially required high vent settings (PEEP 10, FiO2 100%) and pressor support with NE and vaso.  Cardiology consulted for abnormal EKG with new RBBB.  Mild metabolic derangements on initial labs without electrolyte disturbances.  Formal TTE stat with EF 55%, grade 1 diastolic dysfunction, and moderate to severely dilated LV with PASP 34.  CTA chest 11/15 showing massive bilateral PE, right greater than left with significant RV strain.  CT AP without evidence of hemorrhage or acute abdominal pathology.  After much discussion with family regarding risk/benefit, started on therapeutic heparin  drip 11/15. The patient continued to be intubated and sedated due to high ventilation requirements. Antibiotics were begun on 11/17 given fever and possible pneumonia on CXR. He continues to require pressors, but has been able to slowly wean  down. FiO2 was weaned to 40% on 11/20 ; hopeful to extubate on 11/21. Switched from heparin  gtt to eliquis  on 11/20.  Mental / Cognitive: Cooperative, Follows commands  Pain: No complaint of pain  Persons Present: Family (Daughter and significant other)      Imaging  No results found.  CHEST SINGLE VIEW  Result Date: 08/29/2024  Stable support devices. Persistent small left pleural effusion with slight improved though unresolved left lower lobe consolidation. Stable cardiomediastinal silhouette.  Finalized by Eleanor Don, M.D. on 08/29/2024 8:28 AM. Dictated by Eleanor Don, M.D. on 08/29/2024 8:27 AM.      Nutrition  Nutrition Prior To Hospitalization: Oral, Regular, Thin Liquids  Current Form Of Nutrition: NPO    Education  Persons Educated: Pt/Family  Barriers To Learning: None Noted  Interventions: Family Educated  Teaching Methods: Verbal  Topics: Dysphagia  Patient Response: Verbalized Understanding    Assessment/Prognosis  Plan: 3-5 x/week  Prognosis: Good  NOMS Dysphagia Rating: 2-Moderately-Severe Dysphagia -Not able to swallow safely by mouth for nutrition/hydration but may take some consistency w/ consistent max cues in therapy only. Alternative method of feeding required.    Clinical Swallow Goals  Goal : Pt will tolerate ice chips with <5% s/s of aspiration and no negative changes in pulmonary status.  Goal : Pt will participate in ongoing swallow assessment.    Speech Discharge Recommendations  Recommendation: Inpatient setting    Therapist:Marisa Hufstetler Sherril, M.S. CCC-SLP  Date:08/31/2024

## 2024-09-01 LAB — CULTURE-BLOOD W/SENSITIVITY

## 2024-09-01 LAB — BASIC METABOLIC PANEL
~~LOC~~ BKR ANION GAP: 13 — ABNORMAL HIGH (ref 3–12)
~~LOC~~ BKR BLD UREA NITROGEN: 72 mg/dL — ABNORMAL HIGH (ref 7–25)
~~LOC~~ BKR CALCIUM: 8.9 mg/dL (ref 8.5–10.6)
~~LOC~~ BKR CO2: 18 mmol/L — ABNORMAL LOW (ref 21–30)
~~LOC~~ BKR CREATININE: 2.3 mg/dL — ABNORMAL HIGH (ref 0.40–1.24)
~~LOC~~ BKR GLOMERULAR FILTRATION RATE (GFR): 31 mL/min — ABNORMAL LOW (ref >60)
~~LOC~~ BKR GLUCOSE, RANDOM: 181 mg/dL — ABNORMAL HIGH (ref 70–100)
~~LOC~~ BKR SODIUM, SERUM: 149 mmol/L — ABNORMAL HIGH (ref 137–147)

## 2024-09-01 LAB — POC GLUCOSE
~~LOC~~ BKR POC GLUCOSE: 103 mg/dL — ABNORMAL HIGH (ref 70–100)
~~LOC~~ BKR POC GLUCOSE: 105 mg/dL — ABNORMAL HIGH (ref 70–100)
~~LOC~~ BKR POC GLUCOSE: 168 mg/dL — ABNORMAL HIGH (ref 70–100)
~~LOC~~ BKR POC GLUCOSE: 82 mg/dL (ref 70–100)
~~LOC~~ BKR POC GLUCOSE: 85 mg/dL (ref 70–100)
~~LOC~~ BKR POC GLUCOSE: 95 mg/dL (ref 70–100)

## 2024-09-01 LAB — COMPREHENSIVE METABOLIC PANEL
~~LOC~~ BKR ALT: 24 U/L — ABNORMAL LOW (ref 7–56)
~~LOC~~ BKR ANION GAP: 12 pg — ABNORMAL HIGH (ref 3–12)
~~LOC~~ BKR AST: 52 U/L — ABNORMAL HIGH (ref 7–40)
~~LOC~~ BKR CO2: 19 mmol/L — ABNORMAL LOW (ref 21–30)
~~LOC~~ BKR GLOMERULAR FILTRATION RATE (GFR): 30 mL/min — ABNORMAL LOW (ref >60–5.1)

## 2024-09-01 MED ORDER — POTASSIUM CHLORIDE 20 MEQ PO TBTQ
20 meq | Freq: Once | ORAL | 0 refills | Status: CP
Start: 2024-09-01 — End: ?
  Administered 2024-09-01: 20 meq via ORAL

## 2024-09-01 MED ORDER — ACETAMINOPHEN 160 MG/5 ML PO SOLN
650 mg | ORAL | 0 refills | Status: DC | PRN
Start: 2024-09-01 — End: 2024-09-03
  Administered 2024-09-02 – 2024-09-03 (×3): 650 mg via ORAL

## 2024-09-01 MED ORDER — LACTATED RINGERS IV BOLUS
500 mL | Freq: Once | INTRAVENOUS | 0 refills | Status: CP
Start: 2024-09-01 — End: ?
  Administered 2024-09-01: 13:00:00 500 mL via INTRAVENOUS

## 2024-09-01 NOTE — Care Plan [600008]
 Problem: Discharge Planning  Goal: Participation in plan of care  Outcome: Goal Ongoing  Goal: Knowledge regarding plan of care  Outcome: Goal Ongoing  Goal: Prepared for discharge  Outcome: Goal Ongoing     Problem: Skin Integrity  Goal: Skin integrity intact  Outcome: Goal Ongoing  Goal: Healing of skin (Wound & Incision)  Outcome: Goal Ongoing  Goal: Healing of skin (Pressure Injury)  Outcome: Goal Ongoing     Problem: Glucose Management  Goal: Absence of hyperglycemia  Outcome: Goal Ongoing  Goal: Absence of Hypoglycemia  Outcome: Goal Ongoing  Goal: Glucose level within specified parameters  Outcome: Goal Ongoing     Problem: Moderate Fall Risk  Goal: Moderate Fall Risk  Outcome: Goal Ongoing

## 2024-09-01 NOTE — Progress Notes [1]
 SPEECH-LANGUAGE PATHOLOGY  DAILY TREATMENT NOTE      Treatment Summary:   Dysphagia therapy completed.     Mild-moderate oropharyngeal dysphagia  Sources of Dysphagia: Recent intubation (11/15-11/21)  Persons Educated: Patient/Family (sig other)    Swallow Recommendations  PO: Full Liquid, Thin liquids  Medications: As tolerated, Whole in puree, Whole with liquids, Crushed in puree  Ice Chip Trials: Unlimited  Supervision: 1:1 (to  ensure tolerance initially); please hold if there are concerns for s/s of aspiration  Positioning: Upright as tolerated  Oral Hygiene: BID Complete oral care to minimize the risk of aspirating oral bacteria      Goal : Pt will tolerate ice chips with <5% s/s of aspiration and no negative changes in pulmonary status.  Met  Discharge this goal as met    Goal : Pt will participate in ongoing swallow assessment.  Partly met  Continue to address this goal     Add goal : The patient will consume thin liquids w/ <10% overt clinical s/s of aspiration and no adverse pulmonary implications.     PO Presentations  Presentations: Therapist Fed, Patient Fed Self  Thin Liquid: 1 Tsp, Cup, Straw, Consecutive Swallows  Other Consistencies: Ice chips, Puree, Minced and moist    Oral Stage  Withdraw Bolus: WFL  Form Bolus: WFL  Masticate Bolus: WFL  Transfer Bolus: WFL  Anterior Bolus Spillage: None  Oral Residue: None    Pharyngeal Stage  Laryngeal Elevation: (present)  Signs / Symptoms Of Aspiration: Minced and moist   Minced and moist: throat clearing, 2-3 swallows per bolus  Suspected Pharyngeal Stage Impairment: Incomplete pharyngeal clearance    Plan: ongoing eval w/ solids  Frequency: 3-5x/week    Therapist: Rosina Staples, MA,CCC-SLP  Date: 09/01/2024

## 2024-09-01 NOTE — Progress Notes [1]
 MEDICAL INTENSIVE CARE UNIT  PROGRESS NOTE       Patient's Name:  Matthew Paul MRN: [id number]   Patient's DOB: 06/07/1966   Today's Date:  09/01/2024  Admission Date: 08/24/2024  LOS: 7 days    Problem list  Principal Problem:    Acquired bilateral renal cysts  Active Problems:    ADPKD (autosomal dominant polycystic kidney disease)    Cardiac arrest (CMS-HCC)    Acute kidney injury superimposed on CKD    Obstructive cardiovascular shock (CMS-HCC)    Lactic acidosis    High anion gap metabolic acidosis    Acute pulmonary embolism with acute cor pulmonale (CMS-HCC)    Acute respiratory failure with hypoxia (CMS-HCC)      HOSPITAL COURSE SUMMARY     Matthew Paul is a 58 y.o. 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  Patient admitted initially on 11/14 for planned left robotic renal cyst decortication and cystoscopy with ureteral stent placement.  No complications during procedure and patient had existing JP drain placed.  Unfortunately, patient suffered PEA cardiac arrest on 11/15 with ROSC following 2 rounds of epinephrine .    Patient was transported to the ICU on 11/15 and initially required high vent settings [PEEP 10, FiO2 100%] and pressor support with NE and vaso.  Cardiology consulted for abnormal EKG with new RBBB.  Mild metabolic derangements on initial labs without electrolyte disturbances.  Formal TTE stat with EF 55%, grade 1 diastolic dysfunction, and moderate to severely dilated LV with PASP 34.  CTA chest 11/15 showing massive bilateral PE, right greater than left with significant RV strain.  CT AP without evidence of hemorrhage or acute abdominal pathology.  After much discussion with family regarding risk/benefit, started on therapeutic heparin  drip 11/15. The patient continued to be intubated and sedated due to high ventilation requirements over the next several days. Antibiotics were begun on 11/17 given fever and possible pneumonia on CXR. Switched from heparin  gtt to eliquis  on 11/20.  He was extubated on 11/21 and no longer required pressors.     Interval update 09/01/2024:  > Trial foley removal. Retaining on next check, will plan to straight cath then recheck in 3-4 hours. If still retaining at that time, will plan for replacement of foley.   > Tylenol  for pain.   > Creatinine improving with robust UOP. Will give 500 LR and advance diet per SLP to thin liquids. Recheck BMP at 1500 for Na in decision to redose fluids.   > Transfer to floor   > barriers to DC: weakness, needing PT/OT assessment    ASSESSMENT & PLAN     NEURO/PSYCH  #No acute concerns  - Initially post-code: moving all extremities, opening eyes, attempting to pull at tube prior to sedation   PLAN:  > A+O x4 post extubation    -----------------------------------------------------------------------------------------------------------------------------------------------------------------------------------------------------------------------  PULMONARY  #Mechanical intubation for airway protection s/p extubation 11/21  #Bilateral massive PE  - Intubated on 11/15 post PEA arrest. ETT advanced to 28 at the teeth   - Bedside POCUS concerning for RV overload and McConnell's sign.   - TTE: RV overload further c/f PE  - CTA Chest: massive multiple BL PE, R>L. Elevated RV:LV c/w right heart strain w/ dilation of RA, RV, main PA  - Result of long discussion with family, pharmacy, interventional radiology, regarding overall multidisciplinary approach and  decision for tPA versus heparin  was to initiate heparin .  Discussed risks/benefit with family at length especially given his recent surgical procedure yesterday and existing JP drain, along with potential for existing cerebral aneurysms that are often associated with autosomal dominant PKD.  They understand this and would like to continue forward with heparin  GGT.    PLAN:  > Continue pulmonary hygiene: flutter valve, duo nebs, hypertonic saline   > Continue eliquis  10 mg BID through 11/24, then 5 mg BID     #Suspected left lower lobe pneumonia  - MRSA nares +  - 11/17 CXR: Increasing left lower lobe consolidation which could be infection, aspiration, or worsening atelectasis.   - s/p 4 day course IV steroids  PLAN:  > Continue vancomycin  and zosyn  (11/17-11/24) to treat pneumonia empirically due to fever and potential pneumonia on CXR, plan for 7 day course   > MRSA nares +, continue vanc  > Incentive spirometry, duonebs, and sodium chloride  3% nebulizers for airway clearance    -----------------------------------------------------------------------------------------------------------------------------------------------------------------------------------------------------------------------  CARDIOVASCULAR  #PEA arrest   #Shock - obstructive 2/2 PE , resolved  #Remote history of Afib (1980s) not on AC   #Volume Overload  - PEA arrest on 11/15, ROSC after 2 rounds of epi and total code lasting 4-6 minutes.  - PTA: ASA 81 mg daily  - EKG:               Previous OSH 2020: SR, Qtc 406               EKG (11/15 post code): Sinus tachy 100s, Qtc 452, new RBBB  - Pressors: NE, vaso   - Echo (11/15): EF 55%. No dia dysfunction. Mod to severely enlarged RV w/ intraventricular septal flattening. PASP 35 + CVP. No major valvular abnormalities.   - CTA Chest: massive bl PE with RV strain.   - CT AP: normal post-surgical changes.   - Trop: 138   PLAN:  > BP stable     #HLD   - PTA: atorvastatin  20 mg   - Lipid profile: chol 124, TG 208, HDL 33, LDL 82  PLAN:  > Continue atorvastatin      #HTN  - PTA meds: HCTZ 25 mg qAM, lisinopril  20 mg daily  PLAN:  > HOLD HCTZ and lisinopril  - due to hypotension    -----------------------------------------------------------------------------------------------------------------------------------------------------------------------------------------------------------------------  FEN/GI  #Elevated LFTs, likely post-code, resolved  #GERD  #Constipation, resolved  - Post code LFTs AST 157 (13 on admit), ALT 163 (12 on admit). Tbili 0.4.  - PTA meds: omeprazole 40 mg daily   PLAN:  > Daily CMP   > SLP evaluated: advanced diet to thin liquids 11/22  > Continue PPI daily  > Bowel regimen PRN, Imodium  PRN due to frequent stools post extubation    -----------------------------------------------------------------------------------------------------------------------------------------------------------------------------------------------------------------------  RENAL  #ADPKD  #AKI on CKD   #HAGMA, mild likely 2/2 above further renal dysfunction  - PTA meds: jynarque  (valptan) 60 mg qAM / 30 mg qPM  - Baseline Cr 2.5 - 2.8   -11/14 underwent left robotic renal cyst decortication, cystoscopy, L ureteral stent placement for enlarging bilateral renal size causing pressure and discomfort in the setting of ADPKD.  Total of roughly 100 cysts were decorticated with 19 large cysts sent for pathology.  Placement of left ureteral stent, confirmation prior to surgical completion that left ureter had no urine leak.  No overt post surgical complications.  - CT AP (11/15): normal post-operative changes  Recent Labs  08/30/24  0259 08/31/24  0214 09/01/24  0407   CR 3.35* 3.04* 2.43*   BUN 88* 107* 84*   GFR 20* 23* 30*     - Net IO Since Admission: 1,143.18 mL [09/01/24 0625]  -   Intake/Output Summary (Last 24 hours) at 09/01/2024 9374  Last data filed at 08/31/2024 2000  Gross per 24 hour   Intake 906.05 ml   Output 2935 ml   Net -2028.95 ml   PLAN:  > JP drain in place with ~ 85 mL out in the last 24 hours - Urology managing   > Trial foley removal   > Continue strict I's and O's, avoid further nephrotoxic agents, renally dose medications as able.  > Likely ATN post diuresis, monitoring I/O carefully       -----------------------------------------------------------------------------------------------------------------------------------------------------------------------------------------------------------------------  ENDOCRINE  #No acute concerns   - Invalid input(s): GLUCOSE  Recent Labs     08/30/24  1756 08/31/24  0012 08/31/24  0507 08/31/24  1238 08/31/24  1718 08/31/24  1800 08/31/24  2101 09/01/24  0407   GLUPOC 131* 131* 96 100 84 85 82 95   PLAN:  > CTM      -----------------------------------------------------------------------------------------------------------------------------------------------------------------------------------------------------------------------  HEME/ONC  #DVT/PE  #Normocytic Anemia  #Thrombocytopenia  - 11/16 doppler BLE: acute appearing nonocclusive thrombus in L popliteal vein  Recent Labs     08/30/24  0259 08/31/24  0214 09/01/24  0407   HGB 10.2* 9.7* 10.0*   MCV 94.1 94.9 94.5   PLTCT 191 189 240   PLAN:  > Monitor HGB, Transfuse if Hgb <7 and PLT < 10   > Continue eliquis  10 mg BID through 11/24, then 5 mg BID   > No evidence of acute bleeding. Ongoing monitor for s/s of bleeding.     #Leukocytosis, likely reactive, resolved  - Initial leukocytosis downtrended, consistent with reactive leukocytosis due to surgery/cardiac arrest, until elevation on 11/18  PLAN:   > Likely due to initiation of steroids, last dose on 11/20, continue to monitor with daily CBC  -----------------------------------------------------------------------------------------------------------------------------------------------------------------------------------------------------------------------  ID  #Febrile  #Suspected Left Lower Lobe Pneumonia  - Last febrile temperature at 2000 on 11/17  - MRSA nares +  - 11/17 CXR: Increasing left lower lobe consolidation which could be infection, aspiration, or worsening atelectasis.   - s/p 4 day course IV steroids   Recent Labs     08/30/24  0259 08/31/24  0214 09/01/24  0407   WBC 12.80* 9.70 11.20*     - Temp (24hrs), Avg:37.7 ?C (99.9 ?F), Min:37.5 ?C (99.5 ?F), Max:38 ?C (100.4 ?F)      Culture Data Date collected Result   Blood cx 11/17 NG x 4d   Sputum cx 11/17 Normal oropharyngeal flora   Urine cx 11/17 NG x 24h   Blood cx 11/20 NG x24h     Antimicrobial First dose Last dose Comment   Ancef  11/14 11/14 X2, for perioperative abx   Vancomycin  11/17     Zosyn  11/17       PLAN:  > Follow cultures as above  > Continue zosyn  and vancomycin  to treat pneumonia empirically due to fever and possible pneumonia on CXR. Plan for 7 day course (11/17-11/24)  > Tylenol  PRN for fever    ----------------------------------------------------------------------------------------------------------------------------------------------------------------------------------------------------------------------  MSK/DERM  #Gout   PLAN:  > Hold PTA allopurinol , colchicine  0.6 mg prn for flares     -----------------------------------------------------------------------------------------------------------------------------------------------------------------------------------------------------------------------  LDA/PROPHYLAXIS  Patient Lines/Drains/Airways Status       Active Lines:  Name Placement date Placement time Site Days    Surgical Drain Bulb (e.g. JP) Left Abdomen #1 08/24/24  1733  -- 8    Indwelling Urinary Catheter 16 FR Standard 2-way 08/27/24  1100  -- 5    Peripheral IV 08/31/24 1342 Right Mid Upper Arm 20 G 08/31/24  1342  -- 1    Peripheral IV 08/31/24 1452 Left Mid Forearm 20 G 08/31/24  1452  -- 1                    Lines: PIV x2  Tubes: JP drain L abd (11/14 -)  Urinary Catheter:  No (11/15 -11/22) - trial on 11/22  VTE ppx: SCDs, eliquis   GI:  PPI  Bowel regimen: miralax  and senna  Diet: Regular Diet  DIET NPO Ice Chips, Sips With Medications  Code status: Full Code     Patient discussed with Marty JONETTA Curet, MD     Dalayah Deahl, DO      SUBJECTIVE     No acute events overnight.  Continues to be very sore all over, having some crampy abdominal pain.  Stepdaughter present at bedside and asked question such as ongoing plan for antibiotics, PT/OT, and urinary catheter.  Answered all questions at time of discussion.  Will plan on transferring him to the floor.    Past Medical History:    Acid reflux    ADPKD (autosomal dominant polycystic kidney)    Apnea    Gout    Hyperlipidemia    Hypertension    Mild shortness of breath    Paroxysmal A-fib (CMS-HCC)     Surgical History:   Procedure Laterality Date    HX HERNIA REPAIR Right 1983    ROBOTIC LAPAROSCOPIC ABLATION RENAL CYSTS Right 06/08/2019    Performed by Erskin Lenis, MD at [facility] OR    ROBOT ASSISTED SURGERY N/A 06/08/2019    Performed by Erskin Lenis, MD at [facility] OR    CYSTOURETHROSCOPY WITH INDWELLING URETERAL STENT INSERTION Right 06/08/2019    Performed by Erskin Lenis, MD at [facility] OR    ROBOTIC ASSISTED LAPAROSCOPIC RENAL CYST DECORTICATION Left 10/02/2019    Performed by Erskin Lenis, MD at [facility] OR    CYSTOURETHROSCOPY WITH INDWELLING URETERAL STENT INSERTION Left 10/02/2019    Performed by Erskin Lenis, MD at [facility] OR    ABLATION, CYST, KIDNEY, LAPAROSCOPIC Left 08/24/2024    Performed by Erskin Lenis LABOR, MD at [facility] OR    ROBOT ASSISTED SURGERY Left 08/24/2024    Performed by Erskin Lenis LABOR, MD at [facility] OR    CYSTOURETHROSCOPY, WITH INDWELLING URETERAL STENT INSERTION Left 08/24/2024    Performed by Erskin Lenis LABOR, MD at [facility] OR    HX TONSILLECTOMY      KIDNEY CYST REMOVAL      Multiple    KNEE CARTILAGE SURGERY Bilateral      No family history on file.  Social History[1]  Vaping/E-liquid Use    Vaping Use Never User      Vaping/E-liquid Substances    CBD No     Nicotine No     Other No     Flavored No     THC No     Unknown No             Current Medications[2]       OBJECTIVE Vital Signs: Last Filed                 Vital  Signs: 24 Hour Range   BP: 135/61 (11/21 2000)  Temp: 37.5 ?C (99.5 ?F) (11/21 2000)  Pulse: 103 (11/21 2025)  Respirations: 18 PER MINUTE (11/21 2025)  SpO2: 96 % (11/21 2025)  O2%: 40 % (11/21 0919)  O2 Device: High flow nasal cannula (11/21 2025)  O2 Liter Flow: 1 Lpm (11/21 2025) BP: (95-139)/(52-72)   Temp:  [37.5 ?C (99.5 ?F)-38 ?C (100.4 ?F)]   Pulse:  [69-109]   Respirations:  [11 PER MINUTE-22 PER MINUTE]   SpO2:  [90 %-98 %]   O2%:  [40 %]   O2 Device: High flow nasal cannula  O2 Liter Flow: 1 Lpm     Vitals:    08/26/24 1000 08/29/24 0400 08/31/24 1718   Weight: (!) 144.2 kg (317 lb 14.5 oz) (!) 142.1 kg (313 lb 4.4 oz) 135.2 kg (298 lb 1.6 oz)         Artificial Airway   None       Ventilator/Respiratory Therapy  None    Vent Weaning   Not applicable    Physical Exam  Constitutional: 58 y.o. male  A + O x4  Head: Normocephalic, atraumatic  Cardiovascular: Regular rhythm, tachycardic 90-100s rate  Pulmonary: Clear to auscultation bilaterally, no wheezes or rales. Breathing well on RA  GI: Abdomen soft, non-tender, non-distended (baseline habitus), bowel sounds +  Skin: No rashes or bruises, good turgor, cap refill <2s  Neuro:  Moves all extremities, no focal deficit  Musculoskeletal: No deformities  Lymphatic/extremities: no LE edema    Laboratory:  Recent Labs     08/29/24  1327 08/30/24  0259 08/31/24  0214 09/01/24  0407   NA 134* 140 144 149*   K 4.3 4.0 3.9 4.4   CL 102 105 111* 118*   CO2 21 22 23  19*   GAP 11 13* 10 12   BUN 81* 88* 107* 84*   CR 3.44* 3.35* 3.04* 2.43*   GLU 155* 128* 154* 101*   CA 9.2 9.8 9.7 9.6   ALBUMIN   --  2.9* 3.0* 3.0*   MG  --  2.3 2.5 2.3       Recent Labs     08/29/24  1327 08/30/24  0259 08/31/24  0214 09/01/24  0407   WBC 15.70* 12.80* 9.70 11.20*   HGB 10.8* 10.2* 9.7* 10.0*   HCT 33.4* 30.9* 29.3* 31.4*   PLTCT 172 191 189 240   AST  --  15 25 52*   ALT  --  4* 8 24   ALKPHOS  --  56 68 115* Estimated Creatinine Clearance: 51.1 mL/min (A) (by C-G formula based on SCr of 2.43 mg/dL (H)).  Vitals:    08/26/24 1000 08/29/24 0400 08/31/24 1718   Weight: (!) 144.2 kg (317 lb 14.5 oz) (!) 142.1 kg (313 lb 4.4 oz) 135.2 kg (298 lb 1.6 oz)      Recent Labs     08/30/24  0316 08/31/24  0214   PHART 7.35 7.33*   PO2ART 92 146*         Pertinent radiology reviewed.    CHEST SINGLE VIEW   Final Result         Stable support devices.      Persistent small left pleural effusion with slight improved though unresolved left lower lobe consolidation.      Stable cardiomediastinal silhouette.          Finalized by Eleanor Don, M.D. on 08/29/2024 8:28 AM. Dictated  by Eleanor Don, M.D. on 08/29/2024 8:27 AM.      FEEDING TUBE PLCMNT (ABD/CHEST LMTD)   Final Result   FINDINGS/IMPRESSION:      Gastric tube courses below the diaphragm with tip projecting over the gastric antrum.      Moderate gaseous distention of the stomach. Visualized bowel gas pattern nonobstructive.       Similar left lower lobe consolidation.      By my electronic signature, I attest that I have personally reviewed the images for this examination and formulated the interpretations and opinions expressed in this report          Finalized by Omar Pinal, D.O. on 08/28/2024 10:20 AM. Dictated by Ozell Mall, DO on 08/28/2024 9:17 AM.      CHEST SINGLE VIEW   Final Result         Endotracheal tube with the tip approximately 8 cm above the carina. This could be slightly advanced for more optimal positioning.      Gastric tube courses below the diaphragm and out of the field-of-view.      Increasing left lower lobe consolidation which could be infection, aspiration, or worsening atelectasis.          Finalized by Eleanor Don, M.D. on 08/27/2024 12:25 PM. Dictated by Eleanor Don, M.D. on 08/27/2024 12:23 PM.      FEEDING TUBE PLCMNT (ABD/CHEST LMTD)   Final Result         Gastric tube with sidehole projecting over the body of the stomach and tip projecting at the gastric antrum.       The stomach is mildly distended.       Patchy bibasilar opacities are better demonstrated on recent CT chest.      By my electronic signature, I attest that I have personally reviewed the images for this examination and formulated the interpretations and opinions expressed in this report          Finalized by Christobal SHAUNNA Sawyer, MD on 08/26/2024 1:35 PM. Dictated by Mont Lay, MD on 08/26/2024 1:28 PM.      US  VENOUS DOPPLER BILATERAL   Final Result         1.  Acute appearing nonocclusive thrombus in the left popliteal vein.      2.  No femoral/popliteal deep venous thrombosis in right lower extremity.      By my electronic signature, I attest that I have personally reviewed the images for this examination and formulated the interpretations and opinions expressed in this report          Finalized by Allean Pouch, M.D. on 08/26/2024 11:24 AM. Dictated by Mont Lay, MD on 08/26/2024 11:14 AM.      US  EXTREMITY LIMITED RIGHT   Final Result         1.  Acute appearing nonocclusive thrombus in the left popliteal vein.      2.  No femoral/popliteal deep venous thrombosis in right lower extremity.      By my electronic signature, I attest that I have personally reviewed the images for this examination and formulated the interpretations and opinions expressed in this report          Finalized by Allean Pouch, M.D. on 08/26/2024 11:24 AM. Dictated by Mont Lay, MD on 08/26/2024 11:14 AM.      CTA CHEST PULM EMBOLISM W/CONT   Final Result         CTA CHEST:      1. Multiple bilateral pulmonary  emboli as described above, right greater than left. Elevated RV: LV ratio consistent with right heart strain, with dilatation of the right atrium, right ventricle and main pulmonary artery. Borderline cardiomegaly.   2. Moderate left lower lobe with additional areas of mild bilateral atelectasis, without significant pulmonary infarct or pleural effusion. CT abdomen and pelvis:      1. Postsurgical changes of reported recent left robotic renal cyst decortication, with left perinephric stranding and multifocal gas. Small amount of free air is noted and likely postoperative. Surgical drain extending to the left lateral perinephric region, with no abdominal or pelvic fluid collection.   2. Redemonstration of marked bilateral renal enlargement with innumerable cysts, consistent with autosomal dominant polycystic kidney disease. 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   3. Bladder is decompressed about a Foley catheter, limiting evaluation.   4. Colonic diverticulosis without acute diverticulitis or bowel obstruction.   5. Trace pelvic free fluid, nonspecific though possibly postoperative.      Major preliminary findings including presence of extensive pulmonary emboli with right heart strain was conveyed to Dr. Quinton Voth by Dairl messenger at 4:34 PM on 08/25/2024.          Finalized by Norleen Pane, M.D. on 08/25/2024 4:35 PM. Dictated by Norleen Pane, M.D. on 08/25/2024 4:12 PM.      CT ABD/PELV W CONTRAST   Final Result         CTA CHEST:      1. Multiple bilateral pulmonary emboli as described above, right greater than left. Elevated RV: LV ratio consistent with right heart strain, with dilatation of the right atrium, right ventricle and main pulmonary artery. Borderline cardiomegaly.   2. Moderate left lower lobe with additional areas of mild bilateral atelectasis, without significant pulmonary infarct or pleural effusion.      CT abdomen and pelvis:      1. Postsurgical changes of reported recent left robotic renal cyst decortication, with left perinephric stranding and multifocal gas. Small amount of free air is noted and likely postoperative. Surgical drain extending to the left lateral perinephric region, with no abdominal or pelvic fluid collection.   2. Redemonstration of marked bilateral renal enlargement with innumerable cysts, consistent with autosomal dominant polycystic kidney disease. 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   3. Bladder is decompressed about a Foley catheter, limiting evaluation.   4. Colonic diverticulosis without acute diverticulitis or bowel obstruction.   5. Trace pelvic free fluid, nonspecific though possibly postoperative.      Major preliminary findings including presence of extensive pulmonary emboli with right heart strain was conveyed to Dr. Blessin Kanno by Dairl messenger at 4:34 PM on 08/25/2024.          Finalized by Norleen Pane, M.D. on 08/25/2024 4:35 PM. Dictated by Norleen Pane, M.D. on 08/25/2024 4:12 PM.      2D + DOPPLER ECHO   Final Result      CHEST SINGLE VIEW   Final Result   FINDINGS/IMPRESSION:        Support Devices: Endotracheal tube has been slightly advanced, with the tip now projecting 3 cm above the carina. This could be slightly advanced for optimal positioning.      Lungs/Pleura: Inferior aspects of both lungs, particularly the left, are excluded from the field-of-view,. Patchy bibasilar opacities, likely atelectasis. No pneumothorax.      Heart and Mediastinum: The cardiomediastinal silhouette is stable.  Finalized by JOSETTE BEAGLE, M.D. on 08/25/2024 12:48 PM. Dictated by JOSETTE BEAGLE, M.D. on 08/25/2024 12:46 PM.      CHEST SINGLE VIEW   Final Result   FINDINGS/IMPRESSION:        Support Devices: Endotracheal tube in place, with the tip projecting 0.5 cm above the carina. Advancement is recommended..      Lungs/Pleura: Inferior aspect of the left lung, and inferior right costophrenic angle are excluded from the film.SABRA Patchy bibasilar opacities, likely atelectasis. Mild pulmonary edema.. No pneumothorax.SABRA      Heart and Mediastinum: Cardiac silhouette is incompletely visualized, although likely within normal limits.          Finalized by JOSETTE BEAGLE, M.D. on 08/25/2024 12:49 PM. Dictated by JOSETTE BEAGLE, M.D. on 08/25/2024 12:48 PM.           Scheduled Meds:[Held by Provider] allopurinoL  (ZYLOPRIM ) tablet 100 mg, 100 mg, Oral, QDAY  apixaban  (ELIQUIS ) tablet 10 mg, 10 mg, Oral, BID   Followed by  NOREEN ON 09/02/2024] apixaban  (ELIQUIS ) tablet 5 mg, 5 mg, Oral, BID  atorvastatin  (LIPITOR) tablet 10 mg, 10 mg, Per NG tube, QDAY  bacitracin  zinc  topical ointment, , Topical, BID  lactated ringers  IV bolus 500 mL, 500 mL, Intravenous, ONCE  pantoprazole  (PROTONIX ) injection 40 mg, 40 mg, Intravenous, QDAY  petrolatum  (STYE) ophthalmic ointment 0.25 inch, 0.25 inch, Both Eyes, Q6H  piperacillin /tazobactam (ZOSYN ) 4.5 g in sodium chloride  0.9% (NS) 100 mL IVPB (MB+)(EXTENDED INFUSION), 4.5 g, Intravenous, Q6H*  polyethylene glycol 3350  (MIRALAX ) packet 17 g, 1 packet, Feeding Tube, QDAY  sennosides-docusate sodium  (SENOKOT-S) tablet 1 tablet, 1 tablet, Per OG Tube, QDAY  sodium chloride  (NEBUSAL) 3 % nebulizer solution 4 mL, 4 mL, Inhalation, BID  vancomycin , random dosing, 1 each, Intravenous, Random Dosing    Continuous Infusions:   sodium chloride  0.9% TKO infusion 5 mL/hr at 08/31/24 0528     PRN and Respiratory Meds:acetaminophen  Q6H PRN, albuterol -ipratropium Q4H PRN, dextrose  50% PRN, pancrelipase  20,880 Units/sodium bicarbonate  650 mg ([place] CLOG DESTROYER) PRN (On Call from Rx), vancomycin , pharmacy to manage Per Pharmacy      Malnutrition Details:              Loss of Subcutaneous Fat: No      Muscle Wasting: No                  Active Wounds                                                  [1]   Social History  Socioeconomic History    Marital status: Divorced   Tobacco Use    Smoking status: Never    Smokeless tobacco: Former     Types: Chew     Quit date: 1990    Tobacco comments:     Chewed x10 years   Vaping Use    Vaping status: Never Used   Substance and Sexual Activity    Alcohol use: Yes     Alcohol/week: 2.0 standard drinks of alcohol     Types: 2 Cans of beer per week Comment: Occasional    Drug use: Never   [2]   Current Facility-Administered Medications:     acetaminophen  oral solution 650 mg, 650 mg, Oral, Q6H PRN, Knox Alan CROME, MD, 650 mg at 09/01/24 0006  albuterol -ipratropium (DUONEB) nebulizer solution 3 mL, 3 mL, Inhalation, Q4H PRN, Niza Soderholm G, DO, 3 mL at 08/29/24 1307    [Held by Provider] allopurinoL  (ZYLOPRIM ) tablet 100 mg, 100 mg, Oral, QDAY, Marty Toribio SAUNDERS, MD, 100 mg at 08/25/24 9062    apixaban  (ELIQUIS ) tablet 10 mg, 10 mg, Oral, BID, 10 mg at 08/31/24 2008 **FOLLOWED BY** [START ON 09/02/2024] apixaban  (ELIQUIS ) tablet 5 mg, 5 mg, Oral, BID, Peja Allender G, DO    atorvastatin  (LIPITOR) tablet 10 mg, 10 mg, Per NG tube, QDAY, Charline Hoskinson G, DO, 10 mg at 08/31/24 0800    bacitracin  zinc  topical ointment, , Topical, BID, Marty Toribio SAUNDERS, MD, Given at 08/31/24 2015    dextrose  50% (D50) syringe 25-50 mL, 12.5-25 g, Intravenous, PRN, Dodd, Danica, MD    lactated ringers  IV bolus 500 mL, 500 mL, Intravenous, ONCE, Stanley Helmuth G, DO    pancrelipase  20,880 Units/sodium bicarbonate  650 mg ([place] CLOG DESTROYER), , Feeding Tube, PRN (On Call from Rx), Dodd, Danica, MD    pantoprazole  (PROTONIX ) injection 40 mg, 40 mg, Intravenous, QDAY, Provider, Pharmacy, 40 mg at 08/31/24 0800    petrolatum  (STYE) ophthalmic ointment 0.25 inch, 0.25 inch, Both Eyes, Q6H, Jobe, Amanda L, MD, 0.25 inch at 08/31/24 9490    piperacillin /tazobactam (ZOSYN ) 4.5 g in sodium chloride  0.9% (NS) 100 mL IVPB (MB+)(EXTENDED INFUSION), 4.5 g, Intravenous, Q6H*, Judea Fennimore G, DO, Last Rate: 33 mL/hr at 09/01/24 0553, 4.5 g at 09/01/24 0553    polyethylene glycol 3350  (MIRALAX ) packet 17 g, 1 packet, Feeding Tube, QDAY, Arieal Cuoco G, DO, 17 g at 08/31/24 0800    sennosides-docusate sodium  (SENOKOT-S) tablet 1 tablet, 1 tablet, Per OG Tube, QDAY, [place] Hollar G, DO, 1 tablet at 08/31/24 0800    sodium chloride  (NEBUSAL) 3 % nebulizer solution 4 mL, 4 mL, Inhalation, BID, Jina Olenick G, DO, 4 mL at 08/31/24 2025    sodium chloride  0.9% TKO infusion, , Intravenous, Continuous, Jobe, Amanda L, MD, Last Rate: 5 mL/hr at 08/31/24 0528, New Bag at 08/31/24 0528    vancomycin , pharmacy to manage, 1 each, Service, Per Pharmacy, Provider, Pharmacy    vancomycin , random dosing, 1 each, Intravenous, Random Dosing, Provider, Pharmacy

## 2024-09-02 LAB — CBC
~~LOC~~ BKR HEMATOCRIT: 31 % — ABNORMAL LOW (ref 40.0–50.0)
~~LOC~~ BKR MCH: 31 pg — ABNORMAL LOW (ref 26.0–34.0)
~~LOC~~ BKR MCHC: 33 g/dL — ABNORMAL HIGH (ref 32.0–36.0)
~~LOC~~ BKR MCV: 95 fL — ABNORMAL LOW (ref 80.0–100.0)
~~LOC~~ BKR MPV: 8.5 fL — ABNORMAL HIGH (ref 7.0–11.0)
~~LOC~~ BKR PLATELET COUNT: 238 10*3/uL — ABNORMAL HIGH (ref 150–400)
~~LOC~~ BKR RDW: 15 % — ABNORMAL HIGH (ref 11.0–15.0)

## 2024-09-02 LAB — COMPREHENSIVE METABOLIC PANEL: ~~LOC~~ BKR ALK PHOSPHATASE: 159 U/L — ABNORMAL HIGH (ref 25–110)

## 2024-09-02 MED ORDER — LISINOPRIL 10 MG PO TAB
10 mg | Freq: Every day | ORAL | 0 refills | Status: DC
Start: 2024-09-02 — End: 2024-09-10
  Administered 2024-09-02 – 2024-09-03 (×2): 10 mg via ORAL

## 2024-09-02 MED ORDER — MELATONIN 3 MG PO TAB
6 mg | Freq: Every evening | ORAL | 0 refills | Status: DC
Start: 2024-09-02 — End: 2024-09-10
  Administered 2024-09-03 – 2024-09-10 (×8): 6 mg via ORAL

## 2024-09-02 MED ORDER — TAMSULOSIN 0.4 MG PO CAP
.4 mg | Freq: Every day | ORAL | 0 refills | Status: DC
Start: 2024-09-02 — End: 2024-09-10
  Administered 2024-09-02 – 2024-09-10 (×9): 0.4 mg via ORAL

## 2024-09-02 MED ORDER — MAGNESIUM SULFATE IN D5W 1 GRAM/100 ML IV PGBK
1 g | Freq: Once | INTRAVENOUS | 0 refills | Status: CP
Start: 2024-09-02 — End: ?
  Administered 2024-09-03: 03:00:00 1 g via INTRAVENOUS

## 2024-09-02 MED ORDER — VANCOMYCIN 1,250 MG IVPB (VIAL/BAG ADAPTER)
1250 mg | Freq: Once | INTRAVENOUS | 0 refills | Status: CP
Start: 2024-09-02 — End: ?
  Administered 2024-09-02 (×2): 1250 mg via INTRAVENOUS

## 2024-09-02 MED ORDER — PANTOPRAZOLE 40 MG PO TBEC
40 mg | Freq: Every day | ORAL | 0 refills | Status: DC
Start: 2024-09-02 — End: 2024-09-10
  Administered 2024-09-02 – 2024-09-10 (×9): 40 mg via ORAL

## 2024-09-02 NOTE — Progress Notes [1]
 Pharmacy Vancomycin  Note  Subjective:   Matthew Paul is a 58 y.o. male being treated for PNA.    Assessment:   Target levels for this patient:  1.  AUC (mcg*h/mL):  400-600  2.  Trough (mcg/mL): 15-20    Evaluation of AUC and/or level(s): The level was obtained approximately 32 hrs post dose and is within an appropriate range to redose    Plan:   Redose with vancomycin  1250 mg IV x 1 dose this morning  Next scheduled level(s): To be determined  Pharmacy will continue to monitor and adjust therapy as needed.      Objective:     Drug Levels:  Vancomycin  Timed   Date/Time Value Ref Range Status   09/02/2024 0355 15.9 <=30.0 ?g/mL Final       Current Vancomycin  Orders   Medication Dose Route Frequency    vancomycin  (VANCOCIN ) 1,250 mg in sodium chloride  0.9% 250 mL IVPB (Vial/Bag Adapter)  1,250 mg Intravenous ONCE    vancomycin , pharmacy to manage  1 each Service Per Pharmacy    vancomycin , random dosing  1 each Intravenous Random Dosing       Recent Vancomycin  Dosing and Administration:  Recent Vancomycin  Admin                     vancomycin  (VANCOCIN ) 1,250 mg in sodium chloride  0.9% 250 mL IVPB (Vial/Bag Adapter) (mg) 1,250 mg New Bag 08/31/24 2009    vancomycin  (VANCOCIN ) 1,500 mg in sodium chloride  0.9% 250 mL IVPB (Vial/Bag Adapter) (mg) 1,500 mg New Bag 08/30/24 1344    vancomycin  (VANCOCIN ) 1,500 mg in sodium chloride  0.9% 250 mL IVPB (Vial/Bag Adapter) (mg) 1,500 mg New Bag 08/29/24 0944    vancomycin  (VANCOCIN ) 2,500 mg in sodium chloride  0.9% 550 mL IVPB (mg) 2,500 mg New Bag 08/27/24 1512                    Start date of vancomycin  therapy: 08/27/2024    White Blood Cells   Date/Time Value Ref Range Status   09/02/2024 0355 13.80 (H) 4.50 - 11.00 10*3/uL Final   09/01/2024 0407 11.20 (H) 4.50 - 11.00 10*3/uL Final   08/31/2024 0214 9.70 4.50 - 11.00 10*3/uL Final     Creatinine   Date/Time Value Ref Range Status   09/02/2024 0355 2.08 (H) 0.40 - 1.24 mg/dL Final   88/77/7974 8486 2.35 (H) 0.40 - 1.24 mg/dL Final   88/77/7974 9592 2.43 (H) 0.40 - 1.24 mg/dL Final     Blood Urea Nitrogen   Date/Time Value Ref Range Status   09/02/2024 0355 63 (H) 7 - 25 mg/dL Final     Estimated CrCl: 59    Intake/Output Summary (Last 24 hours) at 09/02/2024 0729  Last data filed at 09/02/2024 0600  Gross per 24 hour   Intake 4059 ml   Output 3835 ml   Net 224 ml      Actual Weight:  135.2 kg (298 lb 1.6 oz) Comment: No pillows, wedges, or boots    Flannery Cavallero, PHARMD  09/02/2024

## 2024-09-02 NOTE — Progress Notes [1]
 Urology Progress Note 09/02/2024     Assessment/Plan:    Matthew Paul is a 58 y.o. Male with ADPKD with enlarging bilateral renal size causing pressure and s/p LEFT robotic renal cyst decortication (11/14). Postoperative course complicated by pulmonary embolism resulting in cardiac arrest (11/15) with subsequent ROSC.       Will discuss plan with staff surgeon - Dr. Waddell for Dr. Erskin    - MICU primary; greatly appreciate assistance in management   - Diet: SLP evaluation 11/22 demonstrating mild-moderate oropharyngeal dysphagia - OK for full liquid/thin liquids  - Pain: Recommend MMPC with APAP, oxycodone  5 mg q4h  - CV/Pulm: Successfully titrated off pressor 11/20 PM. Extubated 11/21.   >CXR 11/17: Increasing left lower lobe consolidation which could be infection, aspiration, or worsening atelectasis    >CXR 11/19: Slight improved left lower lobe consolidation  - Renal:   Lab Results   Component Value Date    CR 2.08 (H) 09/02/2024    CR 2.35 (H) 09/01/2024    K 4.4 09/02/2024    K 3.4 (L) 09/01/2024   - GU: Foley removed 11/22 AM c/b urinary retention with replacement 11/22 PM; JP removed 11/23 AM               > Recommend maintaining foley until increased mobility, recommend starting flomax  0.4 mg daily  - Heme/ID:   Lab Results   Component Value Date    HGB 10.5 (L) 09/02/2024    HGB 10.0 (L) 09/01/2024    WBC 13.80 (H) 09/02/2024    WBC 11.20 (H) 09/01/2024    > Febrile 11/17 - started on vanc/zosyn ; planning on abx through 11/24 for pneumonia   > Cultures negative to date  > Heparin  gtt transitioned to eliquis  11/20 per MICU team   >Ok for tPA from surgical perspective if necessary   - Urology to follow. Please page with questions or concerns.    Tinnie Berber, MD  Please page Urology on-call with questions.  __________________________________   SUBJECTIVE:     No acute changes. Doing well today. Minimal abdominal pain. Ongoing bowel function, + flatus, +BM. Has not been out of bed, hopefully working with PT today    OBJECTIVE:                   Vital Signs: Most Recent                Vital Signs: Past 24 Hours   BP: 136/71 (11/23 0400)  Temp: 36.7 ?C (98.1 ?F) (11/23 0400)  Pulse: 103 (11/23 0400)  Respirations: 25 PER MINUTE (11/23 0400)  SpO2: 94 % (11/23 0400)  O2 Device: None (Room air) (11/23 0400)  BP: (126-153)/(71-87)   Temp:  [36.7 ?C (98.1 ?F)-37.6 ?C (99.7 ?F)]   Pulse:  [90-107]   Respirations:  [14 PER MINUTE-25 PER MINUTE]   SpO2:  [92 %-99 %]   O2 Device: None (Room air)       General: Alert, oriented. Conversive  Pulm: Normal work of breathing on RA on my exam, per chart review now on RA  CV: Regular rate  Abd: Soft, non-distended; incisions c/d/I without erythema or purulence. JP ssang  Extremities: No edema; SCD's in place.  GU: foley draining yellow urine    Date 09/01/24 0701 - 09/02/24 0700 09/02/24 0701 - 09/03/24 0700   Shift 0701-1900 1901-0700 24 Hour Total 0701-1900 1901-0700 24 Hour Total   INTAKE   P.O. 2794 1265 4059  Shift Total(mL/kg) 7205(79.2) 8734(0.5) 5940(69)      OUTPUT   Urine(mL/kg/hr) 2280(1.4) 1390(0.9) 3670(1.1)        Straight Cath (mL) 1300  1300        Urine Output (mL) ([REMOVED] Indwelling Urinary Catheter 16 FR Standard 2-way) 430  430        Urine Output (mL) ([REMOVED] External Urinary Catheter Male urinary catheter to suction) 0  0        Urine Output (mL) (Indwelling Urinary Catheter 16 FR Standard 2-way) 550 1390 1940      Drains 100 65 165        Drain Output (mL) (Surgical Drain Bulb (e.g. JP) Left Abdomen #1) 100 65 165      Other           Stool Occurrence  3 x 3 x      Shift Total(mL/kg) 7619(82.3) 1455(10.8) 6164(71.5)      NET 414 -190 224      Weight (kg) 135.2 135.2 135.2 135.2 135.2 135.2       Labs:                     Hematology                               Chemistry   Recent Labs     09/02/24  0355   WBC 13.80*   HGB 10.5*   PLTCT 238    Recent Labs     09/02/24  0355   NA 145   K 4.4   CL 117*   CO2 15*   BUN 63*   CR 2.08*   GFR 36*   GLU 111*   CA 9.7        Malnutrition Details:              Loss of Subcutaneous Fat: No      Muscle Wasting: No                  ACTIVE PROBLEMS:  Principal Problem:    Acquired bilateral renal cysts  Active Problems:    ADPKD (autosomal dominant polycystic kidney disease)    Cardiac arrest (CMS-HCC)    Acute kidney injury superimposed on CKD    Obstructive cardiovascular shock (CMS-HCC)    Lactic acidosis    High anion gap metabolic acidosis    Acute pulmonary embolism with acute cor pulmonale (CMS-HCC)    Acute respiratory failure with hypoxia (CMS-HCC)

## 2024-09-02 NOTE — Progress Notes [1]
 MEDICAL INTENSIVE CARE UNIT  PROGRESS NOTE       Patient's Name:  Matthew Paul MRN: [id number]   Patient's DOB: 08/05/1966   Today's Date:  09/02/2024  Admission Date: 08/24/2024  LOS: 8 days    Problem list  Principal Problem:    Acquired bilateral renal cysts  Active Problems:    ADPKD (autosomal dominant polycystic kidney disease)    Cardiac arrest (CMS-HCC)    Acute kidney injury superimposed on CKD    Obstructive cardiovascular shock (CMS-HCC)    Lactic acidosis    High anion gap metabolic acidosis    Acute pulmonary embolism with acute cor pulmonale (CMS-HCC)    Acute respiratory failure with hypoxia (CMS-HCC)      HOSPITAL COURSE SUMMARY     Nathanel Tallman is a 58 y.o. 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  Patient admitted initially on 11/14 for planned left robotic renal cyst decortication and cystoscopy with ureteral stent placement.  No complications during procedure and patient had existing JP drain placed.  Unfortunately, patient suffered PEA cardiac arrest on 11/15 with ROSC following 2 rounds of epinephrine .    Patient was transported to the ICU on 11/15 and initially required high vent settings [PEEP 10, FiO2 100%] and pressor support with NE and vaso.  Cardiology consulted for abnormal EKG with new RBBB.  Mild metabolic derangements on initial labs without electrolyte disturbances.  Formal TTE stat with EF 55%, grade 1 diastolic dysfunction, and moderate to severely dilated LV with PASP 34.  CTA chest 11/15 showing massive bilateral PE, right greater than left with significant RV strain.  CT AP without evidence of hemorrhage or acute abdominal pathology.  After much discussion with family regarding risk/benefit, started on therapeutic heparin  drip 11/15. The patient continued to be intubated and sedated due to high ventilation requirements over the next several days. Antibiotics were begun on 11/17 given fever and possible pneumonia on CXR. Switched from heparin  gtt to eliquis  on 11/20.  He was extubated on 11/21 and no longer required pressors. Downgraded to floor status on 11/22.     Interval update 09/02/2024:  > failed voiding trial yesterday ; urology recommends start flomax ,  maintain foley placement today, and trial removal when he is more mobile in the coming days, PT/OT consult placed  > JP drain removed by urology   > Restarted PTA lisinopril  at half dose (10mg  daily)   > intermittent confusion overnight, delirium precautions ordered  ASSESSMENT & PLAN     NEURO/PSYCH  #Delirium  - Initially post-code: moving all extremities, opening eyes, attempting to pull at tube prior to sedation   - A+O x4 post-extubation, no focal deficits  PLAN:  > Intermittent confusion overnight, oriented on exam this morning  > Delirium precautions    -----------------------------------------------------------------------------------------------------------------------------------------------------------------------------------------------------------------------  PULMONARY  #Mechanical intubation for airway protection s/p extubation 11/21  #Bilateral massive PE  - Intubated on 11/15 post PEA arrest. ETT advanced to 28 at the teeth   - Bedside POCUS concerning for RV overload and McConnell's sign.   - TTE: RV overload further c/f PE  - CTA Chest: massive multiple BL PE, R>L. Elevated RV:LV c/w right heart strain w/ dilation of RA, RV, main PA  - Result of long discussion with family, pharmacy, interventional radiology, regarding overall multidisciplinary approach and decision for tPA versus heparin  was to initiate  heparin .  Discussed risks/benefit with family at length especially given his recent surgical procedure yesterday and existing JP drain, along with potential for existing cerebral aneurysms that are often associated with autosomal dominant PKD.  They understand this and would like to continue forward with heparin  GGT.    PLAN:  > Continue pulmonary hygiene: flutter valve, duo nebs, hypertonic saline   > Continue eliquis  10 mg BID through 11/24, then 5 mg BID     #Suspected left lower lobe pneumonia  - MRSA nares +  - 11/17 CXR: Increasing left lower lobe consolidation which could be infection, aspiration, or worsening atelectasis.   - s/p 4 day course IV steroids  PLAN:  > Continue vancomycin  and zosyn  (11/17-11/24) to treat pneumonia empirically due to fever and potential pneumonia on CXR, plan for 7 day course   > MRSA nares +, continue vanc  > Incentive spirometry, duonebs, and sodium chloride  3% nebulizers for airway clearance    -----------------------------------------------------------------------------------------------------------------------------------------------------------------------------------------------------------------------  CARDIOVASCULAR  #PEA arrest   #Shock - obstructive 2/2 PE , resolved  #Remote history of Afib (1980s) not on AC   #Volume Overload  - PEA arrest on 11/15, ROSC after 2 rounds of epi and total code lasting 4-6 minutes.  - PTA: ASA 81 mg daily  - EKG:               Previous OSH 2020: SR, Qtc 406               EKG (11/15 post code): Sinus tachy 100s, Qtc 452, new RBBB  - Pressors: NE, vaso   - Echo (11/15): EF 55%. No dia dysfunction. Mod to severely enlarged RV w/ intraventricular septal flattening. PASP 35 + CVP. No major valvular abnormalities.   - CTA Chest: massive bl PE with RV strain.   - CT AP: normal post-surgical changes.   - Trop: 138   PLAN:  > BP stable     #HLD   - PTA: atorvastatin  20 mg   - Lipid profile: chol 124, TG 208, HDL 33, LDL 82  PLAN:  > Continue atorvastatin      #HTN  - PTA meds: HCTZ 25 mg qAM, lisinopril  20 mg daily  PLAN:  > Restart PTA lisinopril  at half dose today given elevated BP and stable kidney function  > HOLD HCTZ  due to hypotension    -----------------------------------------------------------------------------------------------------------------------------------------------------------------------------------------------------------------------  FEN/GI  #Elevated LFTs, likely post-code, resolved  #GERD  #Constipation, resolved  - Post code LFTs AST 157 (13 on admit), ALT 163 (12 on admit). Tbili 0.4.  - PTA meds: omeprazole 40 mg daily   PLAN:  > Daily CMP   > SLP evaluated: advanced diet to thin liquids 11/22  > Continue PPI daily  > Bowel regimen PRN, Imodium  PRN due to frequent stools post extubation    -----------------------------------------------------------------------------------------------------------------------------------------------------------------------------------------------------------------------  RENAL  #ADPKD  #AKI on CKD   #HAGMA, mild likely 2/2 above further renal dysfunction  - PTA meds: jynarque  (valptan) 60 mg qAM / 30 mg qPM  - Baseline Cr 2.5 - 2.8   -11/14 underwent left robotic renal cyst decortication, cystoscopy, L ureteral stent placement for enlarging bilateral renal size causing pressure and discomfort in the setting of ADPKD.  Total of roughly 100 cysts were decorticated with 19 large cysts sent for pathology.  Placement of left ureteral stent, confirmation prior to surgical completion that left ureter had no urine leak.  No overt post surgical complications.  - CT AP (11/15): normal post-operative changes  Recent Labs     09/01/24  0407 09/01/24  1513 09/02/24  0355   CR 2.43* 2.35* 2.08*   BUN 84* 72* 63*   GFR 30* 31* 36*     - Net IO Since Admission: -2,226.82 mL [09/02/24 0625]  -   Intake/Output Summary (Last 24 hours) at 09/02/2024 9374  Last data filed at 09/02/2024 0400  Gross per 24 hour   Intake 2794 ml   Output 3535 ml   Net -741 ml   PLAN:  > JP drain removed by urology on 11/23  > failed voiding trial yesterday ; urology started flomax  and recommend maintain foley placement today and trial removal when he is more mobile in the coming days, PT/OT consult placed  > Continue strict I's and O's, avoid further nephrotoxic agents, renally dose medications as able.  > Likely ATN post diuresis, monitoring I/O carefully       -----------------------------------------------------------------------------------------------------------------------------------------------------------------------------------------------------------------------  ENDOCRINE  #No acute concerns   - Invalid input(s): GLUCOSE  Recent Labs     08/31/24  1238 08/31/24  1718 08/31/24  1800 08/31/24  2101 09/01/24  0407 09/01/24  0806 09/01/24  1201 09/01/24  1512   GLUPOC 100 84 85 82 95 103* 105* 168*   PLAN:  > CTM      -----------------------------------------------------------------------------------------------------------------------------------------------------------------------------------------------------------------------  HEME/ONC  #DVT/PE  #Normocytic Anemia  #Thrombocytopenia  - 11/16 doppler BLE: acute appearing nonocclusive thrombus in L popliteal vein  Recent Labs     08/31/24  0214 09/01/24  0407 09/02/24  0355   HGB 9.7* 10.0* 10.5*   MCV 94.9 94.5 95.9   PLTCT 189 240 238   PLAN:  > Monitor HGB, Transfuse if Hgb <7 and PLT < 10   > Continue eliquis  10 mg BID through 11/24, then 5 mg BID   > No evidence of acute bleeding. Ongoing monitor for s/s of bleeding.     #Leukocytosis, likely reactive, resolved  - Initial leukocytosis downtrended, consistent with reactive leukocytosis due to surgery/cardiac arrest, until elevation on 11/18  PLAN:   > Likely due to initiation of steroids, last dose on 11/20, continue to monitor with daily CBC  -----------------------------------------------------------------------------------------------------------------------------------------------------------------------------------------------------------------------  ID  #Febrile  #Suspected Left Lower Lobe Pneumonia  - Last febrile temperature at 2000 on 11/17  - MRSA nares +  - 11/17 CXR: Increasing left lower lobe consolidation which could be infection, aspiration, or worsening atelectasis.   - s/p 4 day course IV steroids   Recent Labs     08/31/24  0214 09/01/24  0407 09/02/24  0355   WBC 9.70 11.20* 13.80*     - Temp (24hrs), Avg:37.1 ?C (98.7 ?F), Min:36.7 ?C (98.1 ?F), Max:37.6 ?C (99.7 ?F)      Culture Data Date collected Result   Blood cx 11/17 NG x 5d   Sputum cx 11/17 Normal oropharyngeal flora   Urine cx 11/17 NG x 24h   Blood cx 11/20 NG 3d     Antimicrobial First dose Last dose Comment   Ancef  11/14 11/14 X2, for perioperative abx   Vancomycin  11/17     Zosyn  11/17       PLAN:  > Follow cultures as above  > Continue zosyn  and vancomycin  to treat pneumonia empirically due to fever and possible pneumonia on CXR. Plan for 7 day course (11/17-11/24)  > Tylenol  PRN for fever    ----------------------------------------------------------------------------------------------------------------------------------------------------------------------------------------------------------------------  MSK/DERM  #Gout   PLAN:  > Hold PTA allopurinol , colchicine  0.6 mg prn for flares     -----------------------------------------------------------------------------------------------------------------------------------------------------------------------------------------------------------------------  LDA/PROPHYLAXIS  Patient Lines/Drains/Airways Status       Active Lines:       Name Placement date Placement time Site Days    Surgical Drain Bulb (e.g. JP) Left Abdomen #1 08/24/24  1733  -- 9    Indwelling Urinary Catheter 16 FR Standard 2-way 09/01/24  1730  -- 1    Peripheral IV 08/31/24 1342 Right Mid Upper Arm 20 G 08/31/24  1342  -- 2    Peripheral IV 08/31/24 1452 Left Mid Forearm 20 G 08/31/24  1452  -- 2                    Lines: PIV x2  Tubes: JP drain L abd (11/14 -)  Urinary Catheter:  No (11/15 -11/22) - trial on 11/22  VTE ppx: SCDs, eliquis   GI:  PPI  Bowel regimen: miralax  and senna  Diet: Regular Diet  DIET DYSPHAGIA  Code status: Full Code     Patient discussed with Marty JONETTA Curet, MD     Ludie Overly, MD  Internal Medicine PGY-1   Available on Voalte      SUBJECTIVE     No acute events overnight. Continues to have mild pain in his abdomen and his feet, but is otherwise doing okay. He reports that drinking thin liquids went very well yesterday.    His wife reports that he was intermittently confused early this morning.     Past Medical History:    Acid reflux    ADPKD (autosomal dominant polycystic kidney)    Apnea    Gout    Hyperlipidemia    Hypertension    Mild shortness of breath    Paroxysmal A-fib (CMS-HCC)     Surgical History:   Procedure Laterality Date    HX HERNIA REPAIR Right 1983    ROBOTIC LAPAROSCOPIC ABLATION RENAL CYSTS Right 06/08/2019    Performed by Erskin Lenis, MD at [facility] OR    ROBOT ASSISTED SURGERY N/A 06/08/2019    Performed by Erskin Lenis, MD at [facility] OR    CYSTOURETHROSCOPY WITH INDWELLING URETERAL STENT INSERTION Right 06/08/2019    Performed by Erskin Lenis, MD at [facility] OR    ROBOTIC ASSISTED LAPAROSCOPIC RENAL CYST DECORTICATION Left 10/02/2019    Performed by Erskin Lenis, MD at [facility] OR    CYSTOURETHROSCOPY WITH INDWELLING URETERAL STENT INSERTION Left 10/02/2019    Performed by Erskin Lenis, MD at [facility] OR    ABLATION, CYST, KIDNEY, LAPAROSCOPIC Left 08/24/2024    Performed by Erskin Lenis LABOR, MD at [facility] OR    ROBOT ASSISTED SURGERY Left 08/24/2024    Performed by Erskin Lenis LABOR, MD at [facility] OR    CYSTOURETHROSCOPY, WITH INDWELLING URETERAL STENT INSERTION Left 08/24/2024    Performed by Erskin Lenis LABOR, MD at [facility] OR    HX TONSILLECTOMY      KIDNEY CYST REMOVAL      Multiple    KNEE CARTILAGE SURGERY Bilateral      No family history on file.  Social History[1]  Vaping/E-liquid Use    Vaping Use Never User      Vaping/E-liquid Substances    CBD No     Nicotine No     Other No     Flavored No     THC No Unknown No             Current Medications[2]       OBJECTIVE  Vital Signs: Last Filed                 Vital Signs: 24 Hour Range   BP: 136/71 (11/23 0400)  Temp: 36.7 ?C (98.1 ?F) (11/23 0400)  Pulse: 103 (11/23 0400)  Respirations: 25 PER MINUTE (11/23 0400)  SpO2: 94 % (11/23 0400)  O2 Device: None (Room air) (11/23 0400)  O2 Liter Flow: 1 Lpm (11/22 0800) BP: (126-153)/(71-87)   Temp:  [36.7 ?C (98.1 ?F)-37.6 ?C (99.7 ?F)]   Pulse:  [90-107]   Respirations:  [14 PER MINUTE-25 PER MINUTE]   SpO2:  [92 %-100 %]   O2 Device: None (Room air)  O2 Liter Flow: 1 Lpm   Intensity Pain Scale (Self Report): 4 (09/02/24 0400) Vitals:    08/26/24 1000 08/29/24 0400 08/31/24 1718   Weight: (!) 144.2 kg (317 lb 14.5 oz) (!) 142.1 kg (313 lb 4.4 oz) 135.2 kg (298 lb 1.6 oz)         Artificial Airway   None       Ventilator/Respiratory Therapy  None    Vent Weaning   Not applicable    Physical Exam  Constitutional: 58 y.o. male  A + O x4  Head: Normocephalic, atraumatic  Cardiovascular: Regular rhythm, tachycardic 90-100s rate  Pulmonary: Clear to auscultation bilaterally, no wheezes or rales. Breathing well on RA  GI: Abdomen soft, non-tender, non-distended (baseline habitus), bowel sounds +  Skin: No rashes or bruises, good turgor, cap refill <2s  Neuro:  Moves all extremities, no focal deficit  Musculoskeletal: No deformities  Lymphatic/extremities: no LE edema    Laboratory:  Recent Labs     08/31/24  0214 09/01/24  0407 09/01/24  1513 09/02/24  0355   NA 144 149* 149* 145   K 3.9 4.4 3.4* 4.4   CL 111* 118* 118* 117*   CO2 23 19* 18* 15*   GAP 10 12 13* 13*   BUN 107* 84* 72* 63*   CR 3.04* 2.43* 2.35* 2.08*   GLU 154* 101* 181* 111*   CA 9.7 9.6 8.9 9.7   ALBUMIN  3.0* 3.0*  --  3.1*   MG 2.5 2.3  --  1.9       Recent Labs     08/31/24  0214 09/01/24  0407 09/02/24  0355   WBC 9.70 11.20* 13.80*   HGB 9.7* 10.0* 10.5*   HCT 29.3* 31.4* 31.7*   PLTCT 189 240 238   AST 25 52* 55*   ALT 8 24 41 ALKPHOS 68 115* 159*      Estimated Creatinine Clearance: 59.7 mL/min (A) (by C-G formula based on SCr of 2.08 mg/dL (H)).  Vitals:    08/26/24 1000 08/29/24 0400 08/31/24 1718   Weight: (!) 144.2 kg (317 lb 14.5 oz) (!) 142.1 kg (313 lb 4.4 oz) 135.2 kg (298 lb 1.6 oz)      Recent Labs     08/31/24  0214   PHART 7.33*   PO2ART 146*         Pertinent radiology reviewed.    CHEST SINGLE VIEW   Final Result         Stable support devices.      Persistent small left pleural effusion with slight improved though unresolved left lower lobe consolidation.      Stable cardiomediastinal silhouette.          Finalized by Eleanor Don, M.D. on 08/29/2024 8:28 AM. Dictated by Eleanor Don, M.D. on  08/29/2024 8:27 AM.      FEEDING TUBE PLCMNT (ABD/CHEST LMTD)   Final Result   FINDINGS/IMPRESSION:      Gastric tube courses below the diaphragm with tip projecting over the gastric antrum.      Moderate gaseous distention of the stomach. Visualized bowel gas pattern nonobstructive.       Similar left lower lobe consolidation.      By my electronic signature, I attest that I have personally reviewed the images for this examination and formulated the interpretations and opinions expressed in this report          Finalized by Omar Pinal, D.O. on 08/28/2024 10:20 AM. Dictated by Ozell Mall, DO on 08/28/2024 9:17 AM.      CHEST SINGLE VIEW   Final Result         Endotracheal tube with the tip approximately 8 cm above the carina. This could be slightly advanced for more optimal positioning.      Gastric tube courses below the diaphragm and out of the field-of-view.      Increasing left lower lobe consolidation which could be infection, aspiration, or worsening atelectasis.          Finalized by Eleanor Don, M.D. on 08/27/2024 12:25 PM. Dictated by Eleanor Don, M.D. on 08/27/2024 12:23 PM.      FEEDING TUBE PLCMNT (ABD/CHEST LMTD)   Final Result         Gastric tube with sidehole projecting over the body of the stomach and tip projecting at the gastric antrum.       The stomach is mildly distended.       Patchy bibasilar opacities are better demonstrated on recent CT chest.      By my electronic signature, I attest that I have personally reviewed the images for this examination and formulated the interpretations and opinions expressed in this report          Finalized by Christobal SHAUNNA Sawyer, MD on 08/26/2024 1:35 PM. Dictated by Mont Lay, MD on 08/26/2024 1:28 PM.      US  VENOUS DOPPLER BILATERAL   Final Result         1.  Acute appearing nonocclusive thrombus in the left popliteal vein.      2.  No femoral/popliteal deep venous thrombosis in right lower extremity.      By my electronic signature, I attest that I have personally reviewed the images for this examination and formulated the interpretations and opinions expressed in this report          Finalized by Allean Pouch, M.D. on 08/26/2024 11:24 AM. Dictated by Mont Lay, MD on 08/26/2024 11:14 AM.      US  EXTREMITY LIMITED RIGHT   Final Result         1.  Acute appearing nonocclusive thrombus in the left popliteal vein.      2.  No femoral/popliteal deep venous thrombosis in right lower extremity.      By my electronic signature, I attest that I have personally reviewed the images for this examination and formulated the interpretations and opinions expressed in this report          Finalized by Allean Pouch, M.D. on 08/26/2024 11:24 AM. Dictated by Mont Lay, MD on 08/26/2024 11:14 AM.      CTA CHEST PULM EMBOLISM W/CONT   Final Result         CTA CHEST:      1. Multiple bilateral pulmonary emboli as described above, right  greater than left. Elevated RV: LV ratio consistent with right heart strain, with dilatation of the right atrium, right ventricle and main pulmonary artery. Borderline cardiomegaly.   2. Moderate left lower lobe with additional areas of mild bilateral atelectasis, without significant pulmonary infarct or pleural effusion.      CT abdomen and pelvis:      1. Postsurgical changes of reported recent left robotic renal cyst decortication, with left perinephric stranding and multifocal gas. Small amount of free air is noted and likely postoperative. Surgical drain extending to the left lateral perinephric region, with no abdominal or pelvic fluid collection.   2. Redemonstration of marked bilateral renal enlargement with innumerable cysts, consistent with autosomal dominant polycystic kidney disease. 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   3. Bladder is decompressed about a Foley catheter, limiting evaluation.   4. Colonic diverticulosis without acute diverticulitis or bowel obstruction.   5. Trace pelvic free fluid, nonspecific though possibly postoperative.      Major preliminary findings including presence of extensive pulmonary emboli with right heart strain was conveyed to Dr. Iannazzo by Dairl messenger at 4:34 PM on 08/25/2024.          Finalized by Norleen Pane, M.D. on 08/25/2024 4:35 PM. Dictated by Norleen Pane, M.D. on 08/25/2024 4:12 PM.      CT ABD/PELV W CONTRAST   Final Result         CTA CHEST:      1. Multiple bilateral pulmonary emboli as described above, right greater than left. Elevated RV: LV ratio consistent with right heart strain, with dilatation of the right atrium, right ventricle and main pulmonary artery. Borderline cardiomegaly.   2. Moderate left lower lobe with additional areas of mild bilateral atelectasis, without significant pulmonary infarct or pleural effusion.      CT abdomen and pelvis:      1. Postsurgical changes of reported recent left robotic renal cyst decortication, with left perinephric stranding and multifocal gas. Small amount of free air is noted and likely postoperative. Surgical drain extending to the left lateral perinephric region, with no abdominal or pelvic fluid collection.   2. Redemonstration of marked bilateral renal enlargement with innumerable cysts, consistent with autosomal dominant polycystic kidney disease. 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   3. Bladder is decompressed about a Foley catheter, limiting evaluation.   4. Colonic diverticulosis without acute diverticulitis or bowel obstruction.   5. Trace pelvic free fluid, nonspecific though possibly postoperative.      Major preliminary findings including presence of extensive pulmonary emboli with right heart strain was conveyed to Dr. Iannazzo by Dairl messenger at 4:34 PM on 08/25/2024.          Finalized by Norleen Pane, M.D. on 08/25/2024 4:35 PM. Dictated by Norleen Pane, M.D. on 08/25/2024 4:12 PM.      2D + DOPPLER ECHO   Final Result      CHEST SINGLE VIEW   Final Result   FINDINGS/IMPRESSION:        Support Devices: Endotracheal tube has been slightly advanced, with the tip now projecting 3 cm above the carina. This could be slightly advanced for optimal positioning.      Lungs/Pleura: Inferior aspects of both lungs, particularly the left, are excluded from the field-of-view,. Patchy bibasilar opacities, likely atelectasis. No pneumothorax.      Heart and Mediastinum: The cardiomediastinal silhouette is stable.  Finalized by JOSETTE BEAGLE, M.D. on 08/25/2024 12:48 PM. Dictated by JOSETTE BEAGLE, M.D. on 08/25/2024 12:46 PM.      CHEST SINGLE VIEW   Final Result   FINDINGS/IMPRESSION:        Support Devices: Endotracheal tube in place, with the tip projecting 0.5 cm above the carina. Advancement is recommended..      Lungs/Pleura: Inferior aspect of the left lung, and inferior right costophrenic angle are excluded from the film.SABRA Patchy bibasilar opacities, likely atelectasis. Mild pulmonary edema.. No pneumothorax.SABRA      Heart and Mediastinum: Cardiac silhouette is incompletely visualized, although likely within normal limits.          Finalized by JOSETTE BEAGLE, M.D. on 08/25/2024 12:49 PM. Dictated by JOSETTE BEAGLE, M.D. on 08/25/2024 12:48 PM.           Scheduled Meds:[Held by Provider] allopurinoL  (ZYLOPRIM ) tablet 100 mg, 100 mg, Oral, QDAY  apixaban  (ELIQUIS ) tablet 5 mg, 5 mg, Oral, BID  atorvastatin  (LIPITOR) tablet 10 mg, 10 mg, Per NG tube, QDAY  bacitracin  zinc  topical ointment, , Topical, BID  pantoprazole  (PROTONIX ) injection 40 mg, 40 mg, Intravenous, QDAY  petrolatum  (STYE) ophthalmic ointment 0.25 inch, 0.25 inch, Both Eyes, Q6H  piperacillin /tazobactam (ZOSYN ) 4.5 g in sodium chloride  0.9% (NS) 100 mL IVPB (MB+)(EXTENDED INFUSION), 4.5 g, Intravenous, Q6H*  polyethylene glycol 3350  (MIRALAX ) packet 17 g, 1 packet, Feeding Tube, QDAY  sennosides-docusate sodium  (SENOKOT-S) tablet 1 tablet, 1 tablet, Per OG Tube, QDAY  sodium chloride  (NEBUSAL) 3 % nebulizer solution 4 mL, 4 mL, Inhalation, BID  vancomycin , random dosing, 1 each, Intravenous, Random Dosing    Continuous Infusions:   sodium chloride  0.9% TKO infusion 5 mL/hr at 08/31/24 0528     PRN and Respiratory Meds:acetaminophen  Q6H PRN, albuterol -ipratropium Q4H PRN, dextrose  50% PRN, pancrelipase  20,880 Units/sodium bicarbonate  650 mg ([place] CLOG DESTROYER) PRN (On Call from Rx), vancomycin , pharmacy to manage Per Pharmacy      Malnutrition Details:              Loss of Subcutaneous Fat: No      Muscle Wasting: No                  Active Wounds                                                    [1]   Social History  Socioeconomic History    Marital status: Divorced   Tobacco Use    Smoking status: Never    Smokeless tobacco: Former     Types: Chew     Quit date: 1990    Tobacco comments:     Chewed x10 years   Vaping Use    Vaping status: Never Used   Substance and Sexual Activity    Alcohol use: Yes     Alcohol/week: 2.0 standard drinks of alcohol     Types: 2 Cans of beer per week     Comment: Occasional    Drug use: Never   [2]   Current Facility-Administered Medications:     acetaminophen  oral solution 650 mg, 650 mg, Oral, Q6H PRN, Iannazzo, Emily G, DO, 650 mg at 09/01/24 2015    albuterol -ipratropium (DUONEB) nebulizer solution 3 mL, 3 mL, Inhalation, Q4H PRN, Iannazzo, Emily G, DO, 3 mL at 08/29/24 1307    [  Held by Provider] allopurinoL  (ZYLOPRIM ) tablet 100 mg, 100 mg, Oral, QDAY, Marty Toribio SAUNDERS, MD, 100 mg at 08/25/24 9062    [COMPLETED] apixaban  (ELIQUIS ) tablet 10 mg, 10 mg, Oral, BID, 10 mg at 09/01/24 2015 **FOLLOWED BY** apixaban  (ELIQUIS ) tablet 5 mg, 5 mg, Oral, BID, Iannazzo, Emily G, DO    atorvastatin  (LIPITOR) tablet 10 mg, 10 mg, Per NG tube, QDAY, Iannazzo, Emily G, DO, 10 mg at 09/01/24 9185    bacitracin  zinc  topical ointment, , Topical, BID, Marty Toribio SAUNDERS, MD, Given at 09/01/24 2045    dextrose  50% (D50) syringe 25-50 mL, 12.5-25 g, Intravenous, PRN, Tahjir Silveria, MD    pancrelipase  [phone] Units/sodium bicarbonate  650 mg ([place] CLOG DESTROYER), , Feeding Tube, PRN (On Call from Rx), Jamilynn Whitacre, MD    pantoprazole  (PROTONIX ) injection 40 mg, 40 mg, Intravenous, QDAY, Provider, Pharmacy, 40 mg at 09/01/24 9185    petrolatum  (STYE) ophthalmic ointment 0.25 inch, 0.25 inch, Both Eyes, Q6H, Jobe, Amanda L, MD, 0.25 inch at 08/31/24 9490    piperacillin /tazobactam (ZOSYN ) 4.5 g in sodium chloride  0.9% (NS) 100 mL IVPB (MB+)(EXTENDED INFUSION), 4.5 g, Intravenous, Q6H*, Iannazzo, Emily G, DO, Last Rate: 33 mL/hr at 09/02/24 0619, 4.5 g at 09/02/24 9380    polyethylene glycol 3350  (MIRALAX ) packet 17 g, 1 packet, Feeding Tube, QDAY, Iannazzo, Emily G, DO, 17 g at 08/31/24 0800    sennosides-docusate sodium  (SENOKOT-S) tablet 1 tablet, 1 tablet, Per OG Tube, QDAY, Iannazzo, Emily G, DO, 1 tablet at 08/31/24 0800    sodium chloride  (NEBUSAL) 3 % nebulizer solution 4 mL, 4 mL, Inhalation, BID, Iannazzo, Emily G, DO, 4 mL at 09/01/24 2113    sodium chloride  0.9% TKO infusion, , Intravenous, Continuous, Jobe, Amanda L, MD, Last Rate: 5 mL/hr at 08/31/24 0528, New Bag at 08/31/24 0528    vancomycin , pharmacy to manage, 1 each, Service, Per Pharmacy, Provider, Pharmacy    vancomycin , random dosing, 1 each, Intravenous, Random Dosing, Provider, Pharmacy

## 2024-09-02 NOTE — Progress Notes [1]
 RT Adult Assessment Note    NAME:Izaan Devonne Lalani             MRN: [id number]             DOB:Sep 05, 1966          AGE: 58 y.o.  ADMISSION DATE: 08/24/2024             DAYS ADMITTED: LOS: 8 days    Additional Comments:  Impressions of the patient: resting in bed  Intervention(s)/outcome(s): none  Patient education that was completed: none  Recommendations to the care team: none    Vital Signs:  Pulse: 89  RR: 19 PER MINUTE  SpO2: 94 %  O2 Device: None (Room air)  Liter Flow:    O2%:      Breath Sounds:   Breath Sounds WDL: Exceptions to WDL  Right Base Breath Sounds: Decreased  Left Base Breath Sounds: Decreased  All Breath Sounds: Clear (Implies normal)  Respiratory Effort:   Respiratory WDL: Within Defined Limits  Respiratory Effort/Pattern: Unlabored  Comments:

## 2024-09-02 NOTE — Progress Notes [1]
 1839: Nat Dandy, MD notified of patient having frequent PVC's and converting into vent bigeminy briefly on 2 occasions before self converting back to NSR/ST. Each time approximately 15 beats of vent bigeminy were observed, but too brief to obtain EKG. This RN inquired about whether patient could potentially benefit from re-consult to cardiology. MD reported that she will pass along to day team tomorrow. Orders received for IV magnesium .

## 2024-09-03 ENCOUNTER — Encounter: Admit: 2024-09-03 | Discharge: 2024-09-03 | Payer: BLUE CROSS/BLUE SHIELD

## 2024-09-03 LAB — BASIC METABOLIC PANEL
~~LOC~~ BKR ANION GAP: 12 (ref 3–12)
~~LOC~~ BKR CALCIUM: 8 mg/dL — ABNORMAL LOW (ref 8.5–10.6)
~~LOC~~ BKR CO2: 14 mmol/L — ABNORMAL LOW (ref 21–30)
~~LOC~~ BKR CREATININE: 1.9 mg/dL — ABNORMAL HIGH (ref 0.40–1.24)
~~LOC~~ BKR GLOMERULAR FILTRATION RATE (GFR): 39 mL/min — ABNORMAL LOW (ref >60)

## 2024-09-03 LAB — CBC
~~LOC~~ BKR HEMATOCRIT: 28 % — ABNORMAL LOW (ref 40.0–50.0)
~~LOC~~ BKR MCH: 31 pg — ABNORMAL HIGH (ref 26.0–34.0)
~~LOC~~ BKR MCHC: 32 g/dL — ABNORMAL HIGH (ref 32.0–36.0)
~~LOC~~ BKR MCV: 97 fL — ABNORMAL LOW (ref 80.0–100.0)
~~LOC~~ BKR MPV: 8.7 fL — ABNORMAL LOW (ref 7.0–11.0)
~~LOC~~ BKR PLATELET COUNT: 229 10*3/uL — ABNORMAL LOW (ref 150–400)
~~LOC~~ BKR RDW: 15 % — ABNORMAL HIGH (ref 11.0–15.0)

## 2024-09-03 LAB — ECG 12-LEAD
P AXIS: -1 degrees
P-R INTERVAL: 124 ms
Q-T INTERVAL: 338 ms
QRS DURATION: 82 ms
QTC CALCULATION (BAZETT): 402 ms
R AXIS: -23 degrees
T AXIS: 20 degrees
VENTRICULAR RATE: 85 {beats}/min

## 2024-09-03 LAB — COMPREHENSIVE METABOLIC PANEL: ~~LOC~~ BKR GLOMERULAR FILTRATION RATE (GFR): 41 mL/min — ABNORMAL LOW (ref >60–5.50)

## 2024-09-03 MED ORDER — ACETAMINOPHEN 325 MG PO TAB
650 mg | ORAL | 0 refills | Status: DC | PRN
Start: 2024-09-03 — End: 2024-09-10
  Administered 2024-09-04: 04:00:00 650 mg via ORAL

## 2024-09-03 MED ORDER — POTASSIUM CHLORIDE 20 MEQ PO TBTQ
40 meq | Freq: Once | ORAL | 0 refills | Status: CP
Start: 2024-09-03 — End: ?
  Administered 2024-09-03: 08:00:00 40 meq via ORAL

## 2024-09-03 NOTE — Progress Notes [1]
 PHYSICAL THERAPY  PROGRESS NOTE          Name: Matthew Paul   MRN: [id number]     DOB: [birth year]      Age: 58 y.o.  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 Postoperative course complicated by pulmonary embolism resulting in cardiac arrest (11/15) with subsequent ROSC. Extubated 11/21  Special Considerations: Proximal muscle weakness  Mental / Cognitive: Alert;Oriented;Cooperative  Pain: Demonstrates non-verbal signs of pain;Complains of pain;Does not rate pain  Pain Location: Bilateral;Ankle  Persons Present: Friend;Occupational Therapist    Home Living Situation  Lives With: Spouse/significant other (lives with girlfriend [name])  Type of Home: House  Entry Stairs: 2 (2 small steps no handrail)  In-Home Stairs: Able to live on main level;IADL in basement (laundry in basement)  Patient Owned Equipment: None    Prior Level of Function  Level Of Independence: Independent with ADL and community mobility without device  Comments: Reports being independent previously. No AD.  History of Falls in Past 3 Months: No    ROM  R UE ROM: WFL   R UE ROM Method: Active  L UE ROM: WFL   L UE ROM Method: Active    Bed Mobility/Transfer  Bed Mobility: Supine to Sit: Maximum Assist;x2 People;Head of Bed Elevated;Verbal Cues;Use of Rail;Assist with B LE;Assist with Trunk  Bed Mobility: Sit to Supine: Maximum Assist;x2 People;Bed Flat;Assist with B LE;Assist with Trunk  Transfer Type: Sit to/from Stand (x 2 attempts, unable to clear buttocks from bed.)  Transfer: Assistance Level: To/From;Bed;Maximal Assist;x2 People  Transfer: Assistive Device: Chief Of Staff Assist  Transfers: Type Of Assistance: Verbal Cues;Elevated Bed;For Strength Deficit;For Balance;For Safety Considerations  End Of Activity Status: In Bed;Nursing Notified;Instructed Patient to Request Assist with Mobility;Instructed Patient to Use Call Light    Balance  Sitting Balance: Static Sitting Balance;Standby Assist  5x Sit to Stand Result (seconds): 0  5X Sit to Stand Comment:: Patient unable to complete due to weakness and pain.    Activity/Exercise  Sit Edge Of Bed: 15 minutes  Sit Edge Of Bed Assist: Stand By Assist  Therapeutic Exercise: Seated;RLE;LLE;Active ROM  Exercise Repetitions: 5    Assessment/Progress  Impaired Mobility Due To: Pain;Decreased Activity Tolerance;Deconditioning;Decreased Strength  Assessment/Progress: Should Improve w/ Continued PT    AM-PAC 6 Clicks Basic Mobility Inpatient  Turning from your back to your side while in a flat bed without using bed rails: A lot  Moving from lying on your back to sitting on the side of a flat bed without using bedrails : A Lot  Moving to and from a bed to a chair (including a wheelchair): Total  Standing up from a chair using your arms (e.g. wheelchair, or bedside chair): Total  To walk in hospital room: Total  Climbing 3-5 steps with a railing: Total  Basic Mobility Inpatient Raw Score: 8  Standardized (T-scale) Score: 22.61  Mobility Goal [facility]: JH-HLM 3 Sit Edge of Bed    Goals  Goal Formulation: With Patient  Time For Goal Achievement: 7  days, To  Patient Will Go Supine To Sit: w/ Stand By Assist  Patient Will Go Sit to Supine : w/ Stand By Assist  Patient Will Transfer Sit to Stand: w/ Stand By Assist  Patient Will Ambulate: 101-150 Feet, w/ Vannie, w/ Stand By Assist  Patient Will Go Up / Down Stairs: 1-2 Stairs, w/ Stand By Assist    Plan  Treatment Interventions: Mobility training;Strengthening;Balance activities;Endurance training  Plan Frequency: 3-5 Days per Week  PT Plan for Next Visit: Progress supine to sit, trial sit to stand, pre-gait activities, neurogym    PT Discharge Recommendations  Recommendation: Inpatient setting  Patient Currently Requires Physical Assist With: All mobility      Therapist: Eleanor Gall, PT, DPT  Date: 09/03/2024

## 2024-09-03 NOTE — Case Mgmt DC Plan [600024]
 Notified by Eleanor (SW) that the patient is anticipated to be ready for discharge TBD and would prefer to stay at [facility] IP rehab unit. Will need to know support system. [facility] admission office will await for completion of rehab consult to determine most appropriate level of discharge needs.    Eleanor Bellman, BSN, RN   [facility] Admission Nurse (office: [phone] or voalte)

## 2024-09-03 NOTE — Case Mgmt DC Plan [600024]
 Case Management Progress Note    NAME:Matthew Paul                          MRN: [id number]              DOB:April 21, 1966          AGE: 58 y.o.  ADMISSION DATE: 08/24/2024             DAYS ADMITTED: LOS: 9 days      Today's Date: 09/03/2024    PLAN: Discharge to inpatient setting when medically stable, pending facility acceptance & insurance authorization.    Expected Discharge Date: 09/05/2024   Is Patient Medically Stable: Yes   Are there Barriers to Discharge? Yes Administrator, Civil Service, insurance authorization)    INTERVENTION/DISPOSITION:  Discharge Planning              Discharge Planning: Inpatient Rehabilitation, Skilled Nursing Facility  SW reviewed EMR & participated in [name].  PT/OT recommending inpatient setting.  SW met with pt's SO [name] & daughter [name] outside pt's room to begin d/c planning discussion. SW provided education about IPR vs. SNF and gave [name] supply for both. [name] & [name] are hoping pt can get into [name] but are agreeable to looking at other facilities if [name] can't accept. SW encouraged them to have some back-up options ready.  SW asked [facility] team to order PM&R consult.  SW made referral to [name] admissions team.  SW will continue to follow.   Transportation              Does the Patient Need Case Management to Arrange Discharge Transport? (ex: facility, ambulance, wheelchair/stretcher, Medicaid, cab, other): Yes  Type of Transport: Wheelchair fleeta  Will the Patient Use Family Transport?: No  Transportation Name, Phone and Availability #1: [name]  [phone]  Support              Support: Pt/Family Updates re:POC or DC Plan, Counseling for Adaptation to Illness, Patient Education, Huddle/team update  Info or Referral                 Positive SDOH Domains and Potential Barriers     Medication Needs    Financial                 Legal                 Other                 Discharge Disposition    Selected Continued Care - Admitted Since 08/24/2024    No services have been selected for the patient.       [name], LMSW  Available on Voalte  Work Cell: [phone]

## 2024-09-03 NOTE — Patient Education [600019]
 Pharmacy Anticoagulation Teaching    Perri Lamagna was provided with both verbal and written drug information about Apixaban . Discussion with Mr. Longsworth included: the medication regimen, dosing, monitoring, possible adverse effects, food/drug interactions to be aware of and OTC/herbal medication use. Emphasis was placed on the importance of medication compliance. Mr. Laursen was also encouraged to contact the pharmacist with any further questions.    Valery Battiest  09/03/2024

## 2024-09-03 NOTE — Case Mgmt DC Plan [600024]
 Request for Benefits    Received request from Melissa Hocevar,  [facility] to check IPR and SNF benefits for patient.    SNF Benefits:  120 days allowed per benefit period.  20%co ins  Ded is met so 100%  No days have been used    IPR Benefits:  120 days   20%% co ins  Ded is met, will pay at 100%    Deductible $1700  Patient has met $1700 of deductible.   Out of Pocket Maximum $4400  Patient has met $4400 of out of pocket.    Call Reference #[id number]  Spk to [facility]     Matthew Paul  Case Management Assistant  For further assistance please contact SWCM *

## 2024-09-03 NOTE — Progress Notes [1]
 MEDICAL INTENSIVE CARE UNIT  PROGRESS NOTE       Patient's Name:  Matthew Paul MRN: [id number]   Patient's DOB: Dec 11, 1965   Today's Date:  09/03/2024  Admission Date: 08/24/2024  LOS: 9 days    Problem list  Principal Problem:    Acquired bilateral renal cysts  Active Problems:    ADPKD (autosomal dominant polycystic kidney disease)    Cardiac arrest (CMS-HCC)    Acute kidney injury superimposed on CKD    Obstructive cardiovascular shock (CMS-HCC)    Lactic acidosis    High anion gap metabolic acidosis    Acute pulmonary embolism with acute cor pulmonale (CMS-HCC)    Acute respiratory failure with hypoxia (CMS-HCC)      HOSPITAL COURSE SUMMARY     Matthew Paul is a 58 y.o. 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  Patient admitted initially on 11/14 for planned left robotic renal cyst decortication and cystoscopy with ureteral stent placement.  No complications during procedure and patient had existing JP drain placed.  Unfortunately, patient suffered PEA cardiac arrest on 11/15 with ROSC following 2 rounds of epinephrine .    Patient was transported to the ICU on 11/15 and initially required high vent settings [PEEP 10, FiO2 100%] and pressor support with NE and vaso.  Cardiology consulted for abnormal EKG with new RBBB.  Mild metabolic derangements on initial labs without electrolyte disturbances.  Formal TTE stat with EF 55%, grade 1 diastolic dysfunction, and moderate to severely dilated LV with PASP 34.  CTA chest 11/15 showing massive bilateral PE, right greater than left with significant RV strain.  CT AP without evidence of hemorrhage or acute abdominal pathology.  After much discussion with family regarding risk/benefit, started on therapeutic heparin  drip 11/15. The patient continued to be intubated and sedated due to high ventilation requirements over the next several days. Antibiotics were begun on 11/17 given fever and possible pneumonia on CXR. Switched from heparin  gtt to eliquis  on 11/20.  He was extubated on 11/21 and no longer required pressors. Downgraded to floor status on 11/22.     Interval update 09/03/2024:  > Last day of Vanc + Zosyn  to complete 7 day course  > Gout pain in bilateral ankles and feet, restarting allopurinol  100 mg qday  ASSESSMENT & PLAN     NEURO/PSYCH  #Delirium  - Initially post-code: moving all extremities, opening eyes, attempting to pull at tube prior to sedation   - A+O x4 post-extubation, no focal deficits  PLAN:  > Intermittent confusion overnight, oriented on exam this morning  > Delirium precautions    -----------------------------------------------------------------------------------------------------------------------------------------------------------------------------------------------------------------------  PULMONARY  #Mechanical intubation for airway protection s/p extubation 11/21  #Bilateral massive PE  - Intubated on 11/15 post PEA arrest. ETT advanced to 28 at the teeth   - Bedside POCUS concerning for RV overload and McConnell's sign.   - TTE: RV overload further c/f PE  - CTA Chest: massive multiple BL PE, R>L. Elevated RV:LV c/w right heart strain w/ dilation of RA, RV, main PA  - Result of long discussion with family, pharmacy, interventional radiology, regarding overall multidisciplinary approach and decision for tPA versus heparin  was to initiate heparin .  Discussed risks/benefit with family at length especially given his recent surgical procedure yesterday and existing JP drain, along with potential for existing cerebral aneurysms that are often associated with  autosomal dominant PKD.  They understand this and would like to continue forward with heparin  GGT.    PLAN:  > Continue pulmonary hygiene: flutter valve, duo nebs, hypertonic saline   > eliquis  10 mg BID through 11/24, now 5 mg BID     #Suspected left lower lobe pneumonia  - MRSA nares +  - 11/17 CXR: Increasing left lower lobe consolidation which could be infection, aspiration, or worsening atelectasis.   - s/p 4 day course IV steroids  PLAN:  > Continue vancomycin  and zosyn  (11/17-11/24) to treat pneumonia empirically due to fever and potential pneumonia on CXR, plan for 7 day course   > MRSA nares +, continue vanc  > Incentive spirometry, duonebs, and sodium chloride  3% nebulizers for airway clearance    -----------------------------------------------------------------------------------------------------------------------------------------------------------------------------------------------------------------------  CARDIOVASCULAR  #PEA arrest   #Shock - obstructive 2/2 PE , resolved  #Remote history of Afib (1980s) not on AC   #Volume Overload  - PEA arrest on 11/15, ROSC after 2 rounds of epi and total code lasting 4-6 minutes.  - PTA: ASA 81 mg daily  - EKG:               Previous OSH 2020: SR, Qtc 406               EKG (11/15 post code): Sinus tachy 100s, Qtc 452, new RBBB  - Pressors: NE, vaso   - Echo (11/15): EF 55%. No dia dysfunction. Mod to severely enlarged RV w/ intraventricular septal flattening. PASP 35 + CVP. No major valvular abnormalities.   - CTA Chest: massive bl PE with RV strain.   - CT AP: normal post-surgical changes.   - Trop: 138   PLAN:  > BP stable     #HLD   - PTA: atorvastatin  20 mg   - Lipid profile: chol 124, TG 208, HDL 33, LDL 82  PLAN:  > Continue atorvastatin      #HTN  - PTA meds: HCTZ 25 mg qAM, lisinopril  20 mg daily  PLAN:  > PTA lisinopril  10 mg qAM  > HOLD HCTZ  due to hypotension    -----------------------------------------------------------------------------------------------------------------------------------------------------------------------------------------------------------------------  FEN/GI  #Elevated LFTs, likely post-code, resolved  #GERD  #Constipation, resolved  - Post code LFTs AST 157 (13 on admit), ALT 163 (12 on admit). Tbili 0.4.  - PTA meds: omeprazole 40 mg daily   PLAN:  > Daily CMP   > SLP evaluated: advanced diet to thin liquids 11/22  > Continue PPI daily  > Bowel regimen PRN, Imodium  PRN due to frequent stools post extubation    -----------------------------------------------------------------------------------------------------------------------------------------------------------------------------------------------------------------------  RENAL  #ADPKD  #AKI on CKD   #HAGMA, mild likely 2/2 above further renal dysfunction  - PTA meds: jynarque  (valptan) 60 mg qAM / 30 mg qPM  - Baseline Cr 2.5 - 2.8   -11/14 underwent left robotic renal cyst decortication, cystoscopy, L ureteral stent placement for enlarging bilateral renal size causing pressure and discomfort in the setting of ADPKD.  Total of roughly 100 cysts were decorticated with 19 large cysts sent for pathology.  Placement of left ureteral stent, confirmation prior to surgical completion that left ureter had no urine leak.  No overt post surgical complications.  - CT AP (11/15): normal post-operative changes  Recent Labs     09/02/24  0355 09/03/24  0045 09/03/24  0405   CR 2.08* 1.95* 1.85*   BUN 63* 49* 48*   GFR 36* 39* 41*     - Net IO Since  Admission: -2,093.82 mL [09/03/24 1626]  -   Intake/Output Summary (Last 24 hours) at 09/03/2024 1626  Last data filed at 09/03/2024 1600  Gross per 24 hour   Intake 2477 ml   Output 3100 ml   Net -623 ml   PLAN:  > JP drain removed by urology on 11/23  > 11/23 urology started flomax  and recommend maintain foley placement today and trial removal when he is more mobile in the coming days with PT/OT   > Continue strict I's and O's, avoid further nephrotoxic agents, renally dose medications as able.  > Likely ATN post diuresis, monitoring I/O carefully -----------------------------------------------------------------------------------------------------------------------------------------------------------------------------------------------------------------------  ENDOCRINE  #No acute concerns   - Invalid input(s): GLUCOSE  Recent Labs     08/31/24  1718 08/31/24  1800 08/31/24  2101 09/01/24  0407 09/01/24  0806 09/01/24  1201 09/01/24  1512   GLUPOC 84 85 82 95 103* 105* 168*   PLAN:  > CTM      -----------------------------------------------------------------------------------------------------------------------------------------------------------------------------------------------------------------------  HEME/ONC  #DVT/PE  #Normocytic Anemia  #Thrombocytopenia  - 11/16 doppler BLE: acute appearing nonocclusive thrombus in L popliteal vein  Recent Labs     09/01/24  0407 09/02/24  0355 09/03/24  0405   HGB 10.0* 10.5* 9.3*   MCV 94.5 95.9 97.4   PLTCT 240 238 229   PLAN:  > Monitor HGB, Transfuse if Hgb <7 and PLT < 10   > Eliquis  10 mg BID through 11/24, now 5 mg BID   > No evidence of acute bleeding. Ongoing monitor for s/s of bleeding.     #Leukocytosis, likely reactive, resolved  - Initial leukocytosis downtrended, consistent with reactive leukocytosis due to surgery/cardiac arrest, until elevation on 11/18  PLAN:   > Likely due to initiation of steroids, last dose on 11/20, continue to monitor with daily CBC  -----------------------------------------------------------------------------------------------------------------------------------------------------------------------------------------------------------------------  ID  #Febrile  #Suspected Left Lower Lobe Pneumonia  - Last febrile temperature at 2000 on 11/17  - MRSA nares +  - 11/17 CXR: Increasing left lower lobe consolidation which could be infection, aspiration, or worsening atelectasis.   - s/p 4 day course IV steroids   Recent Labs     09/01/24  0407 09/02/24  0355 09/03/24  0405 WBC 11.20* 13.80* 12.30*     - Temp (24hrs), Avg:36.7 ?C (98.1 ?F), Min:36.4 ?C (97.5 ?F), Max:37.1 ?C (98.8 ?F)      Culture Data Date collected Result   Blood cx 11/17 NG x 5d   Sputum cx 11/17 Normal oropharyngeal flora   Urine cx 11/17 NG x 24h   Blood cx 11/20 NG 3d     Antimicrobial First dose Last dose Comment   Ancef  11/14 11/14 X2, for perioperative abx   Vancomycin  11/17     Zosyn  11/17       PLAN:  > Follow cultures as above  > Continue zosyn  and vancomycin  to treat pneumonia empirically due to fever and possible pneumonia on CXR. Plan for 7 day course (11/17-11/24)  > Tylenol  PRN for fever    ----------------------------------------------------------------------------------------------------------------------------------------------------------------------------------------------------------------------  MSK/DERM  #Gout   PLAN:  > Resuming allopurinol  100 mg qday for increasing gout pain in bilateral ankles and feet    -----------------------------------------------------------------------------------------------------------------------------------------------------------------------------------------------------------------------  LDA/PROPHYLAXIS  Patient Lines/Drains/Airways Status       Active Lines:       Name Placement date Placement time Site Days    Indwelling Urinary Catheter 16 FR Standard 2-way 09/01/24  1730  -- 2    Peripheral IV 08/31/24  1342 Right Mid Upper Arm 20 G 08/31/24  1342  -- 3    Peripheral IV 08/31/24 1452 Left Mid Forearm 20 G 08/31/24  1452  -- 3                    Lines: PIV x2  Tubes: JP drain L abd (11/14 -)  Urinary Catheter:  No (11/15 -11/22) - trial on 11/22  VTE ppx: SCDs, eliquis   GI:  PPI  Bowel regimen: miralax  and senna  Diet: Regular Diet  DIET REGULAR  Code status: Full Code     Patient discussed with Elspeth ELINORE Pesa, MD     Alois Hamlet, MD  Internal Medicine PGY-1   Available on Voalte      SUBJECTIVE     No acute events overnight. Feels his bilateral ankles and feet are having worsening gout pain. Otherwise feels he continues to improve overall.    Past Medical History:    Acid reflux    ADPKD (autosomal dominant polycystic kidney)    Apnea    Gout    Hyperlipidemia    Hypertension    Mild shortness of breath    Paroxysmal A-fib (CMS-HCC)     Surgical History:   Procedure Laterality Date    HX HERNIA REPAIR Right 1983    ROBOTIC LAPAROSCOPIC ABLATION RENAL CYSTS Right 06/08/2019    Performed by Erskin Lenis, MD at [facility] OR    ROBOT ASSISTED SURGERY N/A 06/08/2019    Performed by Erskin Lenis, MD at [facility] OR    CYSTOURETHROSCOPY WITH INDWELLING URETERAL STENT INSERTION Right 06/08/2019    Performed by Erskin Lenis, MD at [facility] OR    ROBOTIC ASSISTED LAPAROSCOPIC RENAL CYST DECORTICATION Left 10/02/2019    Performed by Erskin Lenis, MD at [facility] OR    CYSTOURETHROSCOPY WITH INDWELLING URETERAL STENT INSERTION Left 10/02/2019    Performed by Erskin Lenis, MD at [facility] OR    ABLATION, CYST, KIDNEY, LAPAROSCOPIC Left 08/24/2024    Performed by Erskin Lenis LABOR, MD at [facility] OR    ROBOT ASSISTED SURGERY Left 08/24/2024    Performed by Erskin Lenis LABOR, MD at [facility] OR    CYSTOURETHROSCOPY, WITH INDWELLING URETERAL STENT INSERTION Left 08/24/2024    Performed by Erskin Lenis LABOR, MD at [facility] OR    HX TONSILLECTOMY      KIDNEY CYST REMOVAL      Multiple    KNEE CARTILAGE SURGERY Bilateral      No family history on file.  Social History[1]  Vaping/E-liquid Use    Vaping Use Never User      Vaping/E-liquid Substances    CBD No     Nicotine No     Other No     Flavored No     THC No     Unknown No             Current Medications[2]       OBJECTIVE                          Vital Signs: Last Filed                 Vital Signs: 24 Hour Range   BP: 133/80 (11/24 1600)  Temp: 36.7 ?C (98.1 ?F) (11/24 1600)  Pulse: 97 (11/24 1600)  Respirations: 18 PER MINUTE (11/24 1600)  SpO2: 94 % (11/24 1600)  O2 Device: None (Room air) (11/24 1600) BP: (127-154)/(67-86)  Temp:  [36.4 ?C (97.5 ?F)-37.1 ?C (98.8 ?F)]   Pulse:  [89-112]   Respirations:  [18 PER MINUTE-26 PER MINUTE]   SpO2:  [93 %-97 %]   O2 Device: None (Room air)   Intensity Pain Scale (Self Report): 3 (09/03/24 1000) Vitals:    08/26/24 1000 08/29/24 0400 08/31/24 1718   Weight: (!) 144.2 kg (317 lb 14.5 oz) (!) 142.1 kg (313 lb 4.4 oz) 135.2 kg (298 lb 1.6 oz)         Artificial Airway   None       Ventilator/Respiratory Therapy  None    Vent Weaning   Not applicable    Physical Exam  Constitutional: 58 y.o. male  A + O x4  Head: Normocephalic, atraumatic  Cardiovascular: Regular rhythm, tachycardic 90-100s rate  Pulmonary: Clear to auscultation bilaterally, no wheezes or rales. Breathing well on RA  GI: Abdomen soft, non-tender, non-distended (baseline habitus), bowel sounds +  Skin: No rashes or bruises, good turgor, cap refill <2s  Neuro:  Moves all extremities, no focal deficit  Musculoskeletal: No deformities  Lymphatic/extremities: Bilateral ankles and feet appear slightly edematous. No erythema but increased calor at ankles.    Laboratory:  Recent Labs     09/01/24  0407 09/01/24  1513 09/02/24  0355 09/03/24  0045 09/03/24  0405   NA 149* 149* 145 143 142   K 4.4 3.4* 4.4 3.6 3.8   CL 118* 118* 117* 117* 117*   CO2 19* 18* 15* 14* 16*   GAP 12 13* 13* 12 9   BUN 84* 72* 63* 49* 48*   CR 2.43* 2.35* 2.08* 1.95* 1.85*   GLU 101* 181* 111* 117* 119*   CA 9.6 8.9 9.7 8.0* 8.2*   ALBUMIN  3.0*  --  3.1*  --  2.8*   MG 2.3  --  1.9 2.0 1.8       Recent Labs     09/01/24  0407 09/02/24  0355 09/03/24  0405   WBC 11.20* 13.80* 12.30*   HGB 10.0* 10.5* 9.3*   HCT 31.4* 31.7* 28.6*   PLTCT 240 238 229   AST 52* 55* 59*   ALT 24 41 55   ALKPHOS 115* 159* 155*      Estimated Creatinine Clearance: 67.1 mL/min (A) (by C-G formula based on SCr of 1.85 mg/dL (H)).  Vitals:    08/26/24 1000 08/29/24 0400 08/31/24 1718   Weight: (!) 144.2 kg (317 lb 14.5 oz) (!) 142.1 kg (313 lb 4.4 oz) 135.2 kg (298 lb 1.6 oz)      No results for input(s): PHART, PO2ART in the last 72 hours.    Invalid input(s): PC02A        Pertinent radiology reviewed.    CHEST SINGLE VIEW   Final Result         Stable support devices.      Persistent small left pleural effusion with slight improved though unresolved left lower lobe consolidation.      Stable cardiomediastinal silhouette.          Finalized by Eleanor Don, M.D. on 08/29/2024 8:28 AM. Dictated by Eleanor Don, M.D. on 08/29/2024 8:27 AM.      FEEDING TUBE PLCMNT (ABD/CHEST LMTD)   Final Result   FINDINGS/IMPRESSION:      Gastric tube courses below the diaphragm with tip projecting over the gastric antrum.      Moderate gaseous distention of the stomach. Visualized bowel gas pattern nonobstructive.  Similar left lower lobe consolidation.      By my electronic signature, I attest that I have personally reviewed the images for this examination and formulated the interpretations and opinions expressed in this report          Finalized by Omar Pinal, D.O. on 08/28/2024 10:20 AM. Dictated by Ozell Mall, DO on 08/28/2024 9:17 AM.      CHEST SINGLE VIEW   Final Result         Endotracheal tube with the tip approximately 8 cm above the carina. This could be slightly advanced for more optimal positioning.      Gastric tube courses below the diaphragm and out of the field-of-view.      Increasing left lower lobe consolidation which could be infection, aspiration, or worsening atelectasis.          Finalized by Eleanor Don, M.D. on 08/27/2024 12:25 PM. Dictated by Eleanor Don, M.D. on 08/27/2024 12:23 PM.      FEEDING TUBE PLCMNT (ABD/CHEST LMTD)   Final Result         Gastric tube with sidehole projecting over the body of the stomach and tip projecting at the gastric antrum.       The stomach is mildly distended.       Patchy bibasilar opacities are better demonstrated on recent CT chest.      By my electronic signature, I attest that I have personally reviewed the images for this examination and formulated the interpretations and opinions expressed in this report          Finalized by Christobal SHAUNNA Sawyer, MD on 08/26/2024 1:35 PM. Dictated by Mont Lay, MD on 08/26/2024 1:28 PM.      US  VENOUS DOPPLER BILATERAL   Final Result         1.  Acute appearing nonocclusive thrombus in the left popliteal vein.      2.  No femoral/popliteal deep venous thrombosis in right lower extremity.      By my electronic signature, I attest that I have personally reviewed the images for this examination and formulated the interpretations and opinions expressed in this report          Finalized by Allean Pouch, M.D. on 08/26/2024 11:24 AM. Dictated by Mont Lay, MD on 08/26/2024 11:14 AM.      US  EXTREMITY LIMITED RIGHT   Final Result         1.  Acute appearing nonocclusive thrombus in the left popliteal vein.      2.  No femoral/popliteal deep venous thrombosis in right lower extremity.      By my electronic signature, I attest that I have personally reviewed the images for this examination and formulated the interpretations and opinions expressed in this report          Finalized by Allean Pouch, M.D. on 08/26/2024 11:24 AM. Dictated by Mont Lay, MD on 08/26/2024 11:14 AM.      CTA CHEST PULM EMBOLISM W/CONT   Final Result         CTA CHEST:      1. Multiple bilateral pulmonary emboli as described above, right greater than left. Elevated RV: LV ratio consistent with right heart strain, with dilatation of the right atrium, right ventricle and main pulmonary artery. Borderline cardiomegaly.   2. Moderate left lower lobe with additional areas of mild bilateral atelectasis, without significant pulmonary infarct or pleural effusion.      CT abdomen and pelvis:  1. Postsurgical changes of reported recent left robotic renal cyst decortication, with left perinephric stranding and multifocal gas. Small amount of free air is noted and likely postoperative. Surgical drain extending to the left lateral perinephric region, with no abdominal or pelvic fluid collection.   2. Redemonstration of marked bilateral renal enlargement with innumerable cysts, consistent with autosomal dominant polycystic kidney disease. 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   3. Bladder is decompressed about a Foley catheter, limiting evaluation.   4. Colonic diverticulosis without acute diverticulitis or bowel obstruction.   5. Trace pelvic free fluid, nonspecific though possibly postoperative.      Major preliminary findings including presence of extensive pulmonary emboli with right heart strain was conveyed to Dr. Iannazzo by Dairl messenger at 4:34 PM on 08/25/2024.          Finalized by Norleen Pane, M.D. on 08/25/2024 4:35 PM. Dictated by Norleen Pane, M.D. on 08/25/2024 4:12 PM.      CT ABD/PELV W CONTRAST   Final Result         CTA CHEST:      1. Multiple bilateral pulmonary emboli as described above, right greater than left. Elevated RV: LV ratio consistent with right heart strain, with dilatation of the right atrium, right ventricle and main pulmonary artery. Borderline cardiomegaly.   2. Moderate left lower lobe with additional areas of mild bilateral atelectasis, without significant pulmonary infarct or pleural effusion.      CT abdomen and pelvis:      1. Postsurgical changes of reported recent left robotic renal cyst decortication, with left perinephric stranding and multifocal gas. Small amount of free air is noted and likely postoperative. Surgical drain extending to the left lateral perinephric region, with no abdominal or pelvic fluid collection.   2. Redemonstration of marked bilateral renal enlargement with innumerable cysts, consistent with autosomal dominant polycystic kidney disease. 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   3. Bladder is decompressed about a Foley catheter, limiting evaluation. 4. Colonic diverticulosis without acute diverticulitis or bowel obstruction.   5. Trace pelvic free fluid, nonspecific though possibly postoperative.      Major preliminary findings including presence of extensive pulmonary emboli with right heart strain was conveyed to Dr. Iannazzo by Dairl messenger at 4:34 PM on 08/25/2024.          Finalized by Norleen Pane, M.D. on 08/25/2024 4:35 PM. Dictated by Norleen Pane, M.D. on 08/25/2024 4:12 PM.      2D + DOPPLER ECHO   Final Result      CHEST SINGLE VIEW   Final Result   FINDINGS/IMPRESSION:        Support Devices: Endotracheal tube has been slightly advanced, with the tip now projecting 3 cm above the carina. This could be slightly advanced for optimal positioning.      Lungs/Pleura: Inferior aspects of both lungs, particularly the left, are excluded from the field-of-view,. Patchy bibasilar opacities, likely atelectasis. No pneumothorax.      Heart and Mediastinum: The cardiomediastinal silhouette is stable.             Finalized by JOSETTE BEAGLE, M.D. on 08/25/2024 12:48 PM. Dictated by JOSETTE BEAGLE, M.D. on 08/25/2024 12:46 PM.      CHEST SINGLE VIEW   Final Result   FINDINGS/IMPRESSION:        Support Devices: Endotracheal tube in place, with the tip projecting 0.5 cm above the carina. Advancement is recommended.SABRA  Lungs/Pleura: Inferior aspect of the left lung, and inferior right costophrenic angle are excluded from the film.SABRA Patchy bibasilar opacities, likely atelectasis. Mild pulmonary edema.. No pneumothorax.SABRA      Heart and Mediastinum: Cardiac silhouette is incompletely visualized, although likely within normal limits.          Finalized by JOSETTE BEAGLE, M.D. on 08/25/2024 12:49 PM. Dictated by JOSETTE BEAGLE, M.D. on 08/25/2024 12:48 PM.           Scheduled Meds:allopurinoL  (ZYLOPRIM ) tablet 100 mg, 100 mg, Oral, QDAY  apixaban  (ELIQUIS ) tablet 5 mg, 5 mg, Oral, BID  atorvastatin  (LIPITOR) tablet 10 mg, 10 mg, Per NG tube, QDAY  bacitracin  zinc  topical ointment, , Topical, BID  lisinopriL  (ZESTRIL ) tablet 10 mg, 10 mg, Oral, QDAY  melatonin (MELATIN) tablet 6 mg, 6 mg, Oral, QHS  pantoprazole  DR (PROTONIX ) tablet 40 mg, 40 mg, Oral, QDAY  polyethylene glycol 3350  (MIRALAX ) packet 17 g, 1 packet, Feeding Tube, QDAY  sennosides-docusate sodium  (SENOKOT-S) tablet 1 tablet, 1 tablet, Per OG Tube, QDAY  sodium chloride  (NEBUSAL) 3 % nebulizer solution 4 mL, 4 mL, Inhalation, BID  tamsulosin  (FLOMAX ) capsule 0.4 mg, 0.4 mg, Oral, QDAY after breakfast    Continuous Infusions:   sodium chloride  0.9% TKO infusion 5 mL/hr at 08/31/24 0528     PRN and Respiratory Meds:acetaminophen  Q6H PRN, albuterol -ipratropium Q4H PRN, dextrose  50% PRN, pancrelipase  20,880 Units/sodium bicarbonate  650 mg ([place] CLOG DESTROYER) PRN (On Call from Rx)      Malnutrition Details:              Loss of Subcutaneous Fat: No      Muscle Wasting: No                  Active Wounds                                                      [1]   Social History  Socioeconomic History    Marital status: Divorced   Tobacco Use    Smoking status: Never    Smokeless tobacco: Former     Types: Chew     Quit date: 1990    Tobacco comments:     Chewed x10 years   Vaping Use    Vaping status: Never Used   Substance and Sexual Activity    Alcohol use: Yes     Alcohol/week: 2.0 standard drinks of alcohol     Types: 2 Cans of beer per week     Comment: Occasional    Drug use: Never   [2]   Current Facility-Administered Medications:     acetaminophen  (TYLENOL ) tablet 650 mg, 650 mg, Oral, Q6H PRN, Iannazzo, Emily G, DO    albuterol -ipratropium (DUONEB) nebulizer solution 3 mL, 3 mL, Inhalation, Q4H PRN, Iannazzo, Emily G, DO, 3 mL at 08/29/24 1307    allopurinoL  (ZYLOPRIM ) tablet 100 mg, 100 mg, Oral, QDAY, Dozier, Darian, DO, 100 mg at 09/03/24 1237    [COMPLETED] apixaban  (ELIQUIS ) tablet 10 mg, 10 mg, Oral, BID, 10 mg at 09/01/24 2015 **FOLLOWED BY** apixaban  (ELIQUIS ) tablet 5 mg, 5 mg, Oral, BID, Iannazzo, Emily G, DO, 5 mg at 09/03/24 9087    atorvastatin  (LIPITOR) tablet 10 mg, 10 mg, Per NG tube, QDAY, Iannazzo, Emily G, DO, 10 mg at 09/03/24 [phone]  bacitracin  zinc  topical ointment, , Topical, BID, Marty Toribio SAUNDERS, MD, Given at 09/03/24 9083    dextrose  50% (D50) syringe 25-50 mL, 12.5-25 g, Intravenous, PRN, Dodd, Danica, MD    lisinopriL  (ZESTRIL ) tablet 10 mg, 10 mg, Oral, QDAY, Iannazzo, Emily G, DO, 10 mg at 09/03/24 9087    melatonin (MELATIN) tablet 6 mg, 6 mg, Oral, QHS, Iannazzo, Emily G, DO, 6 mg at 09/02/24 2037    pancrelipase  20,880 Units/sodium bicarbonate  650 mg ([place] CLOG DESTROYER), , Feeding Tube, PRN (On Call from Rx), Dodd, Danica, MD    pantoprazole  DR (PROTONIX ) tablet 40 mg, 40 mg, Oral, QDAY, Provider, Pharmacy, 40 mg at 09/03/24 9087    polyethylene glycol 3350  (MIRALAX ) packet 17 g, 1 packet, Feeding Tube, QDAY, Iannazzo, Emily G, DO, 17 g at 08/31/24 0800    sennosides-docusate sodium  (SENOKOT-S) tablet 1 tablet, 1 tablet, Per OG Tube, QDAY, Iannazzo, Emily G, DO, 1 tablet at 08/31/24 0800    sodium chloride  (NEBUSAL) 3 % nebulizer solution 4 mL, 4 mL, Inhalation, BID, Iannazzo, Emily G, DO, 4 mL at 09/03/24 9060    sodium chloride  0.9% TKO infusion, , Intravenous, Continuous, Jobe, Amanda L, MD, Last Rate: 5 mL/hr at 08/31/24 0528, New Bag at 08/31/24 0528    tamsulosin  (FLOMAX ) capsule 0.4 mg, 0.4 mg, Oral, QDAY after breakfast, Nyle Tinnie SAILOR, MD, 0.4 mg at 09/03/24 [phone]

## 2024-09-04 ENCOUNTER — Encounter: Admit: 2024-09-04 | Discharge: 2024-09-04 | Payer: BLUE CROSS/BLUE SHIELD

## 2024-09-04 LAB — CBC
~~LOC~~ BKR HEMATOCRIT: 30 % — ABNORMAL LOW (ref 40.0–50.0)
~~LOC~~ BKR HEMOGLOBIN: 10 g/dL — ABNORMAL LOW (ref 13.5–16.5)
~~LOC~~ BKR MCH: 31 pg — ABNORMAL LOW (ref 26.0–34.0)
~~LOC~~ BKR MCV: 94 fL — ABNORMAL LOW (ref 80.0–100.0)
~~LOC~~ BKR RBC COUNT: 3.2 10*6/uL — ABNORMAL LOW (ref 4.40–5.50)
~~LOC~~ BKR WBC COUNT: 12 10*3/uL — ABNORMAL HIGH (ref 4.50–11.00)

## 2024-09-04 LAB — CULTURE-BLOOD W/SENSITIVITY

## 2024-09-04 MED ORDER — MAGNESIUM SULFATE IN D5W 1 GRAM/100 ML IV PGBK
1 g | INTRAVENOUS | 0 refills | Status: CP
Start: 2024-09-04 — End: ?
  Administered 2024-09-04: 19:00:00 1 g via INTRAVENOUS

## 2024-09-04 MED ORDER — SODIUM CHLORIDE 3 % IN NEBU
4 mL | Freq: Two times a day (BID) | RESPIRATORY_TRACT | 0 refills | Status: DC | PRN
Start: 2024-09-04 — End: 2024-09-08
  Administered 2024-09-06: 18:00:00 4 mL via RESPIRATORY_TRACT

## 2024-09-04 MED ORDER — SODIUM CHLORIDE 0.45% WITH SODIUM BICARB IV INFUSION
75 meq | INTRAVENOUS | 0 refills | Status: AC
Start: 2024-09-04 — End: ?
  Administered 2024-09-04 (×2): 75 meq via INTRAVENOUS

## 2024-09-04 MED ORDER — MAGNESIUM SULFATE IN D5W 1 GRAM/100 ML IV PGBK
1 g | INTRAVENOUS | 0 refills | Status: CP
Start: 2024-09-04 — End: ?
  Administered 2024-09-04: 15:00:00 1 g via INTRAVENOUS

## 2024-09-04 MED ORDER — PREDNISONE 20 MG PO TAB
40 mg | Freq: Every day | ORAL | 0 refills | Status: CP
Start: 2024-09-04 — End: ?
  Administered 2024-09-04 – 2024-09-08 (×5): 40 mg via ORAL

## 2024-09-04 NOTE — Case Mgmt DC Plan [600024]
 Per Eleanor HUNT (SW), PM&R consutl has been completed with recs fo r IPR level of care (per chart review no barriers listed). SW aware that [facility] has limited bed availability for admission and may want to pursue alternate placement options in order to not delay discharge. [facility] admission office will follow up with SW, with further updates regarding bed availability.      E.Elih Mooney, Inpatient Admissions Nurse/Rehab. (office# [phone] or voalte).

## 2024-09-04 NOTE — Consults [2]
 Physical Medicine & Rehabilitation Consult Service    Date of Service: 09/04/2024  Matthew Paul is a 58 y.o..  DOB: 06-Jan-1966   MRN: [id number]  Financial Class: Payor: Matthew Paul / Plan: BCBS PC OUT OF STATE / Product Type: PPO /   Date of Admission: 08/24/2024  Referring Physician: Antonetta Elspeth RODES, MD  Reason for Consult: evaluate for Post-Acute Rehab/Placement  Precautions: Fall  Weight Bearing Precautions: WBAT    Active Problems  Principal Problem:    Acquired bilateral renal cysts  Active Problems:    ADPKD (autosomal dominant polycystic kidney disease)    Cardiac arrest (CMS-HCC)    Acute kidney injury superimposed on CKD    Obstructive cardiovascular shock (CMS-HCC)    Lactic acidosis    High anion gap metabolic acidosis    Acute pulmonary embolism with acute cor pulmonale (CMS-HCC)    Acute respiratory failure with hypoxia (CMS-HCC)  Gait abnormality  Impaired mobility/ADLs  Impaired transfers    Assessment & Plan       Matthew Paul is a 58 y.o. male admitted to [facility] on 08/24/2024 with the following issues:    Debility     Post acute placement recommendations: acute inpatient rehabilitation facility     Patient likely has goals in at least two out of three therapeutic disciplines and medical complexity (in setting of recent surgery, PEA arrest, PE on new anticoagulation) to warrant inpatient rehabilitation admission candidacy.     Impairments: pain, poor activity tolerance, and weakness  Activity Limitations:  dressing - lower, toileting, transfers, ambulation, and stairs  Participation Restrictions: unable to return home safely  Goals:    Gait and mobility Min to Mod A    Transfers Min to Mod A    ADLs Min to Mod A       Family / Patient Goals: return home with family assistance   Cognition / Communication Goals: Cognition grossly intact  Rehabilitation Prognosis: Good    Tolerance for three hours of therapy a day: Good    Other recommendations:   Impaired gait/mobility:  The patient will benefit from continued work with PT to address mobility deficits     Impaired ADL:  The patient will benefit from ongoing OT to address functional deficits    Bladder:   Agree with foley catheter for now given limited mobility status. Once desire removal, suggest to follow the urinary catheter removal protocol  Once catheter is removed, if having spontaneous urination, due to potential for neurogenic bladder, consider Post Void Residual (PVRs) with bladder scan to ensure complete emptying and avoidance of urologic complications, until patient has 3 consecutive volumes <145mL     If unable to void spontaneously/having urinary retention post protocol, suggest to evaluate for the best bladder management plan.    If intermittent catheterization is warranted suggest to implement every 4 hours while awake (not going greater than 6 hours between catheterization). With Intermittent straight cath schedule goals of volumes of ~ 400 mL or less per catheterization.  If volume consistently greater per cath, may need to evaluation intakes and outputs and increase ISC to every 3 hours.  If patient requiring large volumes of fluid or if intermittent straight catheterization is difficult to perform, suggest to assess for appropriateness of an indwelling catheter.  If patient is requiring intermittent straight catheterization or indwelling catheter at discharge, suggest to place referral to outpatient urology clinic for continued follow-up for neurogenic bladder.    Thank you for  this consultation.  Please call our consult pager with questions or concerns.    Matthew Crest, DO  Physical Medicine and Rehabilitation     History of Present Illness     CC: Weakness  HPI provided by patient and from chart review:    Hospital Course: [name] is a 58 y.o. male and  has a past medical history of Acid reflux, ADPKD (autosomal dominant polycystic kidney), Apnea, Gout (09/08/2019), Hyperlipidemia, Hypertension, Mild shortness of breath, and Paroxysmal A-fib (CMS-HCC). The patient presented to [facility] on 08/24[facility]2024 for planned robotic assisted left renal cyst decortication with urology.  Hospitalization was complicated by witnessed cardiac arrest on telemetry.  He was able to achieve ROSC with 2 rounds of CPR including epinephrine .  He was extubated on 11/21. CTA chest showed bilateral pulmonary embolism with RV strain.  He was able to transition from heparin  drip to Eliquis .  He completed a 7-day course of vancomycin  and Zosyn  for possible pneumonia.  The patient's hospital course is functionally complicated by impaired mobility and ADLs.  The patient is working with skilled therapies to address functional and mobility deficits, rehab is now consulted for post-acute rehab/placement recommendations.     Patient seen and examined at bedside this afternoon.  He lives with his girlfriend who can provide assistance.  They live in a house with 2 steps for entry and able to live on the main level.  He was previously independent with mobility and ADLs.  Most recent therapy notes reviewed, bed mobility max assist x 2, transfer max assist x 2, sitting balance standby assist, lower extremity dressing total assist, toileting total assist, and endurance noted to be 3 out of 5.  Case management discussed different levels of rehabilitation facility with patient's girlfriend and daughter. Matthew Paul at bedside reports that patient will have home support. They hope for Matthew Paul IPR.      Past Medical History:    Acid reflux    ADPKD (autosomal dominant polycystic kidney)    Apnea    Gout    Hyperlipidemia    Hypertension    Mild shortness of breath    Paroxysmal A-fib (CMS-HCC)        Surgical History:   Procedure Laterality Date    HX HERNIA REPAIR Right 1983    ROBOTIC LAPAROSCOPIC ABLATION RENAL CYSTS Right 06/08/2019    Performed by Erskin Lenis, MD at [facility] OR    ROBOT ASSISTED SURGERY N/A 06/08/2019    Performed by Erskin Lenis, MD at [facility] OR    CYSTOURETHROSCOPY WITH INDWELLING URETERAL STENT INSERTION Right 06/08/2019    Performed by Erskin Lenis, MD at [facility] OR    ROBOTIC ASSISTED LAPAROSCOPIC RENAL CYST DECORTICATION Left 10/02/2019    Performed by Erskin Lenis, MD at [facility] OR    CYSTOURETHROSCOPY WITH INDWELLING URETERAL STENT INSERTION Left 10/02/2019    Performed by Erskin Lenis, MD at [facility] OR    ABLATION, CYST, KIDNEY, LAPAROSCOPIC Left 08/24/2024    Performed by Erskin Lenis LABOR, MD at [facility] OR    ROBOT ASSISTED SURGERY Left 08/24/2024    Performed by Erskin Lenis LABOR, MD at [facility] OR    CYSTOURETHROSCOPY, WITH INDWELLING URETERAL STENT INSERTION Left 08/24/2024    Performed by Erskin Lenis LABOR, MD at [facility] OR    HX TONSILLECTOMY      KIDNEY CYST REMOVAL      Multiple    KNEE CARTILAGE SURGERY Bilateral         Social History  Socioeconomic History    Marital status: Divorced   Tobacco Use    Smoking status: Never    Smokeless tobacco: Former     Types: Chew     Quit date: 1990    Tobacco comments:     Chewed x10 years   Vaping Use    Vaping status: Never Used   Substance and Sexual Activity    Alcohol use: Yes     Alcohol/week: 2.0 standard drinks of alcohol     Types: 2 Cans of beer per week     Comment: Occasional    Drug use: Never     No family history on file.     Scheduled Meds:allopurinoL  (ZYLOPRIM ) tablet 100 mg, 100 mg, Oral, QDAY  apixaban  (ELIQUIS ) tablet 5 mg, 5 mg, Oral, BID  atorvastatin  (LIPITOR) tablet 10 mg, 10 mg, Per NG tube, QDAY  bacitracin  zinc  topical ointment, , Topical, BID  [Held by Provider] lisinopriL  (ZESTRIL ) tablet 10 mg, 10 mg, Oral, QDAY  magnesium  sulfate   1 g/D5W 100 mL IVPB, 1 g, Intravenous, Q4H*   Followed by  magnesium  sulfate   1 g/D5W 100 mL IVPB, 1 g, Intravenous, Q4H*  melatonin (MELATIN) tablet 6 mg, 6 mg, Oral, QHS  pantoprazole  DR (PROTONIX ) tablet 40 mg, 40 mg, Oral, QDAY  polyethylene glycol 3350  (MIRALAX ) packet 17 g, 1 packet, Feeding Tube, QDAY  predniSONE  (DELTASONE ) tablet 40 mg, 40 mg, Oral, QDAY  sennosides-docusate sodium  (SENOKOT-S) tablet 1 tablet, 1 tablet, Per OG Tube, QDAY  sodium chloride  (NEBUSAL) 3 % nebulizer solution 4 mL, 4 mL, Inhalation, BID  tamsulosin  (FLOMAX ) capsule 0.4 mg, 0.4 mg, Oral, QDAY after breakfast    Continuous Infusions:   sodium chloride  0.45% 1,000 mL with sodium bicarbonate  75 mEq IV infusion 75 mEq (09/04/24 0918)    sodium chloride  0.9% TKO infusion 5 mL/hr at 08/31/24 0528     PRN and Respiratory Meds:[Held by Provider] acetaminophen  Q6H PRN, albuterol -ipratropium Q4H PRN, dextrose  50% PRN, pancrelipase  20,880 Units/sodium bicarbonate  650 mg ([place] CLOG DESTROYER) PRN (On Call from Rx)       Allergies[1]    Prior Level of Function   The patient was independent for all home distance/community distance mobility/ambulation and activities of daily living.      Home Environment:  No data recorded  No data recorded  Type of Home: House (09/03/2024  1:00 PM)    Entry Stairs: 2 (2 small steps no handrail) (09/03/2024  1:00 PM)    In-Home Stairs: Able to live on main level; IADL in basement (laundry in basement) (09/03/2024  1:00 PM)        Current Level Of Function:   PT        Bed Mobility/Transfers  Bed Mobility: Rolling: Moderate Assist, Assist with Trunk, Assist with B LE, Bed Flat, Use of Rail, Verbal Cues  Bed Mobility: Supine to Sit: Maximum Assist, x2 People, Head of Bed Elevated, Verbal Cues, Use of Rail, Assist with B LE, Assist with Trunk  Bed Mobility: Sit to Supine: Maximum Assist, x2 People, Bed Flat, Assist with B LE, Assist with Trunk  Comments: attempted lateral scooting, diffiuclty sequencing movement, unable to clear buttocks  Transfer Type: Sit to/from Stand (x 2 attempts, unable to clear buttocks from bed.)  Transfer: Assistance Level: To/From, Bed, Maximal Assist, x2 People  Transfer: Assistive Device: Chief Of Staff Assist  Transfers: Type Of Assistance: Verbal Cues, Elevated Bed, For Strength Deficit, For Balance, For Safety Considerations  End Of Activity Status:  In Bed, Nursing Notified, Instructed Patient to Request Assist with Mobility, Instructed Patient to Use Call Light  Comments: Reports + dizziness upon sititng up, initially BP increased, then + drop in SBPP   OT ADL's  Where Assessed: Supine, Bed, Edge of Bed  Grooming Assist: Not Addressed Today  LE Dressing Assist: Total Assist  LE Dressing Deficits: Don/Doff R Sock, Don/Doff L Sock  Toileting Assist: Total Assist  Toileting Deficits:  (Foley)  Comment: Pt required total assist to don socks prior to mobilizing.   SLP   SWALLOW EVALUATION SUMMARY        Pharyngeal Stage Summary: Delayed throat clear x1 after intake of regular solids, suspect not related to PO as pt was intermittently coughing/throat clearing upon SLP arrival prior to initiation of PO  Swallow Recommendations*  PO: Regular, Thin liquids  Plan: Patient functioning at baseline - No further SLP therapy indicated at this time  Prognosis: Anticipate patient is at or near baseline     Review of Systems     Review of systems was negative except for: that noted in the HPI    Physical Exam     BP: 115/72 (11/25 1200)  Temp: 36.9 ?C (98.4 ?F) (11/25 1200)  Pulse: 114 (11/25 1200)  Respirations: 20 PER MINUTE (11/25 1200)  SpO2: 95 % (11/25 1200)  O2 Device: None (Room air) (11/25 1200)  Body mass index is 33.58 kg/m?SABRA     Head: Normocephalic and atraumatic.   ENT: Conjunctivae and EOM are normal.   Cardiovascular: Extremities well perfused.   Pulmonary/Chest: Effort normal. No respiratory distress.   Abdominal: Soft.  Non-distended.   Skin: Skin is warm and dry.   Extremities: no peripheral edema   Psychiatric: Mood and affect normal.   GU: + foley    Neuro:   Root Right Left   Elbow Flexion C5 5 5   Wrist Extension C6 5 5   Elbow Extension C7 5 5   Finger Flexion C8 5 5   Finger Abduction T1 5 5   Hip Flexion L2 3+ 3+   Knee Extension L3 4 4   Dorsiflexion L4 4 4   EHL Extension  L5 4 4   Plantarflexion S1 5 5 Neuro:  Upper Extremity Sensation Intact to light touch bilaterally   Lower Extremity Sensation Intact to light touch bilaterally       Intake/Output Summary (Last 24 hours) at 09/04/2024 1215  Last data filed at 09/04/2024 1200  Gross per 24 hour   Intake 1350 ml   Output 2680 ml   Net -1330 ml       Labs reviewed as below:   Hematology:    Lab Results   Component Value Date    HGB 10.1 09/04/2024    HGB 12.8 10/03/2019    HCT 30.4 09/04/2024    HCT 38.8 10/03/2019    PLTCT 252 09/04/2024    PLTCT 170 10/03/2019    WBC 12.90 09/04/2024    WBC 8.7 10/03/2019    MCV 94.1 09/04/2024    MCV 93.9 10/03/2019    MCH 31.3 09/04/2024    MCH 31.0 10/03/2019    MCHC 33.2 09/04/2024    MCHC 33.0 10/03/2019    MPV 9.0 09/04/2024    MPV 8.6 10/03/2019    RDW 15.2 09/04/2024    RDW 14.3 10/03/2019   , Coagulation:    Lab Results   Component Value Date    PTT 99.4 08/26/2024    INR  1.0 06/08/2019    and General Chemistry:    Lab Results   Component Value Date    NA 136 09/04/2024    NA 141 08/03/2023    K 3.9 09/04/2024    K 4.3 08/03/2023    CL 108 09/04/2024    CL 106 08/03/2023    CO2 16 09/04/2024    CO2 26 08/03/2023    GAP 12 09/04/2024    GAP 9 08/03/2023    BUN 47 09/04/2024    BUN 31 08/03/2023    CR 2.15 09/04/2024    CR 2.56 08/03/2023    GLU 96 09/04/2024    GLU 96 08/03/2023    CA 9.3 09/04/2024    CA 10.4 08/03/2023    ALBUMIN  3.1 09/04/2024    ALBUMIN  4.0 06/30/2023    LACTIC 0.9 08/30/2024    MG 1.4 09/04/2024    TOTBILI 1.0 09/04/2024    TOTBILI 0.7 06/30/2023    PO4 2.6 04/01/2023        Radiology:    CTA CHEST:     1. Multiple bilateral pulmonary emboli as described above, right greater   than left. Elevated RV: LV ratio consistent with right heart strain, with   dilatation of the right atrium, right ventricle and main pulmonary artery.   Borderline cardiomegaly.   2. Moderate left lower lobe with additional areas of mild bilateral   atelectasis, without significant pulmonary infarct or pleural effusion. CT abdomen and pelvis:     1. Postsurgical changes of reported recent left robotic renal cyst   decortication, with left perinephric stranding and multifocal gas. Small   amount of free air is noted and likely postoperative. Surgical drain   extending to the left lateral perinephric region, with no abdominal or   pelvic fluid collection.   2. Redemonstration of marked bilateral renal enlargement with innumerable   cysts, consistent with autosomal dominant polycystic kidney disease.   Numerous hepatic cysts are also redemonstrated compatible with hepatic   involvement from ADPKD.   3. Bladder is decompressed about a Foley catheter, limiting evaluation.   4. Colonic diverticulosis without acute diverticulitis or bowel   obstruction.   5. Trace pelvic free fluid, nonspecific though possibly postoperative.     Major preliminary findings including presence of extensive pulmonary   emboli with right heart strain was conveyed to Dr. Iannazzo by Dairl   messenger at 4:34 PM on 08/25/2024.        Finalized by Norleen Pane, M.D. on 08/25/2024 4:35 PM. Dictated by   Norleen Pane, M.D. on 08/25/2024 4:12 PM.            [1]   Allergies  Allergen Reactions    Seasonal Allergies SNEEZING

## 2024-09-04 NOTE — Progress Notes [1]
 SPEECH-LANGUAGE PATHOLOGY  DAILY TREATMENT NOTE    Treatment Summary:   Dysphagia therapy completed. Pt w/ one delayed throat clear following intake of regular solids, however, suspect not related to PO intake. See more below. Pt's vocal quality slightly improved since last session.   No patterns of oropharyngeal dysphagia  Persons Educated: Patient  Prognosis: Anticipate pt at/near baseline    Swallow Recommendations  PO: Regular, Thin liquids  Medications: As tolerated  Supervision: Intermittent  Positioning: Upright 90 degrees or chair mode  Swallow Strategies: Small bites/sips, Alternate liquids/solids, Feeding assistance needed  Oral Hygiene: BID  Consider ENT consult for voice     Education  Education was provided to: Pt  Education provided: Regarding diet recommendations and safe swallow strategies. Pt verbalized understanding.     Goal : Pt will tolerate regular solid, thin liquid diet w/ less than 10% s/s of aspiration.  Met  Comment: Observed pt w/ intake of regular solids and thin liquids. One delayed throat clear observed. No other s/s of aspiration observed.  Discharge this goal as met    PO Presentations  Presentations: Patient Fed Self  Thin Liquid: Straw, Consecutive Swallows  Other Consistencies: Regular solids    Oral Stage  Withdraw Bolus: WFL  Form Bolus: WFL  Masticate Bolus: WFL  Transfer Bolus: WFL  Anterior Bolus Spillage: None  Oral Residue: Minimal, Throughout (clears w/ liquid wash)    Pharyngeal Stage  Swallow Initiation: Present  Laryngeal Elevation: Palpable  Signs / Symptoms Of Aspiration: Thin liquid  Pharyngeal Stage Summary: Delayed throat clear x1 after intake of regular solids, suspect not related to PO as pt was intermittently coughing/throat clearing upon SLP arrival prior to initiation of PO    Frequency: No further SLP intervention indicated; will sign-off    Therapist: Rocky Connor, M.A. CF-SLP [facility] #[id number]  Date: 09/04/2024

## 2024-09-04 NOTE — Progress Notes [1]
 MEDICAL INTENSIVE CARE UNIT  PROGRESS NOTE       Patient's Name:  Matthew Paul MRN: [id number]   Patient's DOB: 1966-01-18   Today's Date:  09/04/2024  Admission Date: 08/24/2024  LOS: 10 days    Problem list  Principal Problem:    Acquired bilateral renal cysts  Active Problems:    ADPKD (autosomal dominant polycystic kidney disease)    Cardiac arrest (CMS-HCC)    Acute kidney injury superimposed on CKD    Obstructive cardiovascular shock (CMS-HCC)    Lactic acidosis    High anion gap metabolic acidosis    Acute pulmonary embolism with acute cor pulmonale (CMS-HCC)    Acute respiratory failure with hypoxia (CMS-HCC)      HOSPITAL COURSE SUMMARY     Matthew Paul is a 58 y.o. 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  Patient admitted initially on 11/14 for planned left robotic renal cyst decortication and cystoscopy with ureteral stent placement.  No complications during procedure and patient had existing JP drain placed.  Unfortunately, patient suffered PEA cardiac arrest on 11/15 with ROSC following 2 rounds of epinephrine .    Patient was transported to the ICU on 11/15 and initially required high vent settings [PEEP 10, FiO2 100%] and pressor support with NE and vaso.  Cardiology consulted for abnormal EKG with new RBBB.  Mild metabolic derangements on initial labs without electrolyte disturbances.  Formal TTE stat with EF 55%, grade 1 diastolic dysfunction, and moderate to severely dilated LV with PASP 34.  CTA chest 11/15 showing massive bilateral PE, right greater than left with significant RV strain.  CT AP without evidence of hemorrhage or acute abdominal pathology.  After much discussion with family regarding risk/benefit, started on therapeutic heparin  drip 11/15. The patient continued to be intubated and sedated due to high ventilation requirements over the next several days. Antibiotics were begun on 11/17 given fever and possible pneumonia on CXR. Switched from heparin  gtt to eliquis  on 11/20.  He was extubated on 11/21 and no longer required pressors. Downgraded to floor status on 11/22.     Interval update 09/04/2024:  > Continued gout pain in bilateral ankles and feet, starting prednisone  40 mg qday  x5 days  >AKI Developed  >1/2 NS 1L + bicarb  >Holding lisinopril  per AKI  >Slight transaminitis developed in last few days, Holding acetaminophen   ASSESSMENT & PLAN     NEURO/PSYCH  #Delirium  - Initially post-code: moving all extremities, opening eyes, attempting to pull at tube prior to sedation   - A+O x4 post-extubation, no focal deficits  PLAN:  > Intermittent confusion overnight, oriented on exam this morning  > Delirium precautions    -----------------------------------------------------------------------------------------------------------------------------------------------------------------------------------------------------------------------  PULMONARY  #Mechanical intubation for airway protection s/p extubation 11/21  #Bilateral massive PE  - Intubated on 11/15 post PEA arrest. ETT advanced to 28 at the teeth   - Bedside POCUS concerning for RV overload and McConnell's sign.   - TTE: RV overload further c/f PE  - CTA Chest: massive multiple BL PE, R>L. Elevated RV:LV c/w right heart strain w/ dilation of RA, RV, main PA  - Result of long discussion with family, pharmacy, interventional radiology, regarding overall multidisciplinary approach and decision for tPA versus heparin  was to initiate heparin .  Discussed risks/benefit with family at length especially given his recent surgical procedure yesterday  and existing JP drain, along with potential for existing cerebral aneurysms that are often associated with autosomal dominant PKD.  They understand this and would like to continue forward with heparin  GGT.    PLAN:  > Continue pulmonary hygiene: flutter valve, duo nebs, hypertonic saline   > eliquis  10 mg BID through 11/24, now 5 mg BID     #Suspected left lower lobe pneumonia  - MRSA nares +  - 11/17 CXR: Increasing left lower lobe consolidation which could be infection, aspiration, or worsening atelectasis.   - s/p 4 day course IV steroids  -vancomycin  (MRSA nares +) and zosyn  (11/17-11/24) to treat pneumonia empirically due to fever and potential pneumonia on CXR  PLAN:  > Incentive spirometry, duonebs, and sodium chloride  3% nebulizers for airway clearance  -----------------------------------------------------------------------------------------------------------------------------------------------------------------------------------------------------------------------  CARDIOVASCULAR  #PEA arrest   #Shock - obstructive 2/2 PE , resolved  #Remote history of Afib (1980s) not on AC   #Volume Overload  - PEA arrest on 11/15, ROSC after 2 rounds of epi and total code lasting 4-6 minutes.  - PTA: ASA 81 mg daily  - EKG:               Previous OSH 2020: SR, Qtc 406               EKG (11/15 post code): Sinus tachy 100s, Qtc 452, new RBBB  - Pressors: NE, vaso   - Echo (11/15): EF 55%. No dia dysfunction. Mod to severely enlarged RV w/ intraventricular septal flattening. PASP 35 + CVP. No major valvular abnormalities.   - CTA Chest: massive bl PE with RV strain.   - CT AP: normal post-surgical changes.   - Trop: 138   PLAN:  > BP stable     #HLD   - PTA: atorvastatin  20 mg   - Lipid profile: chol 124, TG 208, HDL 33, LDL 82  PLAN:  > Continue atorvastatin      #HTN  - PTA meds: HCTZ 25 mg qAM, lisinopril  20 mg daily  PLAN:  > Holding lisinopril  10 mg qAM per AKI  > HOLD HCTZ  due to hypotension    -----------------------------------------------------------------------------------------------------------------------------------------------------------------------------------------------------------------------  FEN/GI  #Elevated LFTs, likely post-code, resolved  #GERD  #Constipation, resolved  - Post code LFTs AST 157 (13 on admit), ALT 163 (12 on admit). Tbili 0.4.  - PTA meds: omeprazole 40 mg daily   PLAN:  > Daily CMP   > SLP evaluated: advanced diet to regular, thin liquids 11/24  > Continue PPI daily  > Bowel regimen PRN, Imodium  PRN due to frequent stools post extubation    -----------------------------------------------------------------------------------------------------------------------------------------------------------------------------------------------------------------------  RENAL  #ADPKD  #AKI on CKD   #HAGMA, mild likely 2/2 above further renal dysfunction  - PTA meds: jynarque  (valptan) 60 mg qAM / 30 mg qPM  - Baseline Cr 2.5 - 2.8   -11/14 underwent left robotic renal cyst decortication, cystoscopy, L ureteral stent placement for enlarging bilateral renal size causing pressure and discomfort in the setting of ADPKD.  Total of roughly 100 cysts were decorticated with 19 large cysts sent for pathology.  Placement of left ureteral stent, confirmation prior to surgical completion that left ureter had no urine leak.  No overt post surgical complications.  - CT AP (11/15): normal post-operative changes  -JP drain removed by urology 11/23  Recent Labs     09/03/24  0045 09/03/24  0405 09/04/24  0406   CR 1.95* 1.85* 2.15*   BUN 49* 48* 47*  GFR 39* 41* 35*     - Net IO Since Admission: -3,198.82 mL [09/04/24 1625]  -   Intake/Output Summary (Last 24 hours) at 09/04/2024 1625  Last data filed at 09/04/2024 1600  Gross per 24 hour   Intake 1370 ml   Output 2475 ml   Net -1105 ml   PLAN:  > 11/23 urology started flomax  and recommend maintain foley placement today and trial removal when he is more mobile in the coming days with PT/OT   > Continue strict I's and O's, avoid further nephrotoxic agents, renally dose medications as able.  > Likely ATN post diuresis, monitoring I/O carefully   >Has developed AKI. 1/2 NS 1L + bicarb ordered      -----------------------------------------------------------------------------------------------------------------------------------------------------------------------------------------------------------------------  ENDOCRINE  #No acute concerns   PLAN:  > CTM      -----------------------------------------------------------------------------------------------------------------------------------------------------------------------------------------------------------------------  HEME/ONC  #DVT/PE  #Normocytic Anemia  #Thrombocytopenia  - 11/16 doppler BLE: acute appearing nonocclusive thrombus in L popliteal vein  Recent Labs     09/02/24  0355 09/03/24  0405 09/04/24  0406   HGB 10.5* 9.3* 10.1*   MCV 95.9 97.4 94.1   PLTCT 238 229 252   PLAN:  > Monitor HGB, Transfuse if Hgb <7 and PLT < 10   > Eliquis  10 mg BID through 11/24, now 5 mg BID   > No evidence of acute bleeding. Ongoing monitor for s/s of bleeding.     #Leukocytosis, likely reactive, resolved  - Initial leukocytosis downtrended, consistent with reactive leukocytosis due to surgery/cardiac arrest, until elevation on 11/18  PLAN:   > Likely due to initiation of steroids, last dose on 11/20, continue to monitor with daily CBC   -Further steroids starting 11/25 for gout  -----------------------------------------------------------------------------------------------------------------------------------------------------------------------------------------------------------------------  ID  #Febrile  #Suspected Left Lower Lobe Pneumonia  - Last febrile temperature at 2000 on 11/17  - MRSA nares +  - 11/17 CXR: Increasing left lower lobe consolidation which could be infection, aspiration, or worsening atelectasis.   - s/p 4 day course IV steroids   Recent Labs     09/02/24  0355 09/03/24  0405 09/04/24  0406   WBC 13.80* 12.30* 12.90*     - Temp (24hrs), Avg:37 ?C (98.6 ?F), Min:36.8 ?C (98.2 ?F), Max:37.3 ?C (99.1 ?F)      Culture Data Date collected Result   Blood cx 11/17 NG x 5d   Sputum cx 11/17 Normal oropharyngeal flora   Urine cx 11/17 NG x 24h   Blood cx 11/20 NG 3d     Antimicrobial First dose Last dose Comment   Ancef  11/14 11/14 X2, for perioperative abx   Vancomycin  11/17 11/24    Zosyn  11/17 11/24      PLAN:  > Follow cultures as above  > Tylenol  PRN for fever -> held per transaminitis    ----------------------------------------------------------------------------------------------------------------------------------------------------------------------------------------------------------------------  MSK/DERM  #Gout   PLAN:  > allopurinol  100 mg qday   > prednisone  40 mg qday  x5 days for increasing gout pain in bilateral ankles and feet    -----------------------------------------------------------------------------------------------------------------------------------------------------------------------------------------------------------------------  LDA/PROPHYLAXIS  Patient Lines/Drains/Airways Status       Active Lines:       Name Placement date Placement time Site Days    Indwelling Urinary Catheter 16 FR Standard 2-way 09/01/24  1730  -- 3    Peripheral IV 08/31/24 1342 Right Mid Upper Arm 20 G 08/31/24  1342  -- 4    Peripheral IV 08/31/24 1452 Left Mid Forearm 20 G  08/31/24  1452  -- 4                    Lines: PIV x2  Tubes: JP drain L abd (11/14 -)  Urinary Catheter:  No (11/15 -11/22) - trial on 11/22  VTE ppx: SCDs, eliquis   GI:  PPI  Bowel regimen: miralax  and senna  Diet: Regular Diet  DIET REGULAR  Code status: Full Code     Patient discussed with Elspeth ELINORE Pesa, MD     Alois Hamlet, MD  Internal Medicine PGY-1   Available on Voalte      SUBJECTIVE     No acute events overnight. Feels his bilateral ankles and feet are still having worsening gout pain. Otherwise feels he continues to improve overall.    Past Medical History:    Acid reflux    ADPKD (autosomal dominant polycystic kidney)    Apnea    Gout    Hyperlipidemia Hypertension    Mild shortness of breath    Paroxysmal A-fib (CMS-HCC)     Surgical History:   Procedure Laterality Date    HX HERNIA REPAIR Right 1983    ROBOTIC LAPAROSCOPIC ABLATION RENAL CYSTS Right 06/08/2019    Performed by Erskin Lenis, MD at [facility] OR    ROBOT ASSISTED SURGERY N/A 06/08/2019    Performed by Erskin Lenis, MD at [facility] OR    CYSTOURETHROSCOPY WITH INDWELLING URETERAL STENT INSERTION Right 06/08/2019    Performed by Erskin Lenis, MD at [facility] OR    ROBOTIC ASSISTED LAPAROSCOPIC RENAL CYST DECORTICATION Left 10/02/2019    Performed by Erskin Lenis, MD at [facility] OR    CYSTOURETHROSCOPY WITH INDWELLING URETERAL STENT INSERTION Left 10/02/2019    Performed by Erskin Lenis, MD at [facility] OR    ABLATION, CYST, KIDNEY, LAPAROSCOPIC Left 08/24/2024    Performed by Erskin Lenis LABOR, MD at [facility] OR    ROBOT ASSISTED SURGERY Left 08/24/2024    Performed by Erskin Lenis LABOR, MD at [facility] OR    CYSTOURETHROSCOPY, WITH INDWELLING URETERAL STENT INSERTION Left 08/24/2024    Performed by Erskin Lenis LABOR, MD at [facility] OR    HX TONSILLECTOMY      KIDNEY CYST REMOVAL      Multiple    KNEE CARTILAGE SURGERY Bilateral      No family history on file.  Social History[1]  Vaping/E-liquid Use    Vaping Use Never User      Vaping/E-liquid Substances    CBD No     Nicotine No     Other No     Flavored No     THC No     Unknown No             Current Medications[2]       OBJECTIVE                          Vital Signs: Last Filed                 Vital Signs: 24 Hour Range   BP: 115/72 (11/25 1200)  Temp: 36.9 ?C (98.4 ?F) (11/25 1200)  Pulse: 114 (11/25 1200)  Respirations: 20 PER MINUTE (11/25 1200)  SpO2: 95 % (11/25 1200)  O2 Device: None (Room air) (11/25 1200) BP: (115-157)/(64-83)   Temp:  [36.8 ?C (98.2 ?F)-37.3 ?C (99.1 ?F)]   Pulse:  [93-114]   Respirations:  [18 PER MINUTE-26 PER MINUTE]   SpO2:  [  93 %-96 %]   O2 Device: None (Room air)     Vitals:    08/26/24 1000 08/29/24 0400 08/31/24 1718   Weight: (!) 144.2 kg (317 lb 14.5 oz) (!) 142.1 kg (313 lb 4.4 oz) 135.2 kg (298 lb 1.6 oz)         Artificial Airway   None       Ventilator/Respiratory Therapy  None    Vent Weaning   Not applicable    Physical Exam  Constitutional: 58 y.o. male  A + O x4  Head: Normocephalic, atraumatic  Cardiovascular: Regular rhythm, tachycardic 90-100s rate  Pulmonary: Clear to auscultation bilaterally, no wheezes or rales. Breathing well on RA  GI: Abdomen soft, non-tender, non-distended (baseline habitus), bowel sounds +  Skin: No rashes or bruises, good turgor, cap refill <2s  Neuro:  Moves all extremities, no focal deficit  Musculoskeletal: No deformities  Lymphatic/extremities: Bilateral ankles and feet appear slightly edematous. No erythema but increased calor at ankles.    Laboratory:  Recent Labs     09/02/24  0355 09/03/24  0045 09/03/24  0405 09/04/24  0406   NA 145 143 142 136*   K 4.4 3.6 3.8 3.9   CL 117* 117* 117* 108   CO2 15* 14* 16* 16*   GAP 13* 12 9 12    BUN 63* 49* 48* 47*   CR 2.08* 1.95* 1.85* 2.15*   GLU 111* 117* 119* 96   CA 9.7 8.0* 8.2* 9.3   ALBUMIN  3.1*  --  2.8* 3.1*   MG 1.9 2.0 1.8 1.4*       Recent Labs     09/02/24  0355 09/03/24  0405 09/04/24  0406   WBC 13.80* 12.30* 12.90*   HGB 10.5* 9.3* 10.1*   HCT 31.7* 28.6* 30.4*   PLTCT 238 229 252   AST 55* 59* 51*   ALT 41 55 59*   ALKPHOS 159* 155* 206*      Estimated Creatinine Clearance: 57.7 mL/min (A) (by C-G formula based on SCr of 2.15 mg/dL (H)).  Vitals:    08/26/24 1000 08/29/24 0400 08/31/24 1718   Weight: (!) 144.2 kg (317 lb 14.5 oz) (!) 142.1 kg (313 lb 4.4 oz) 135.2 kg (298 lb 1.6 oz)      No results for input(s): PHART, PO2ART in the last 72 hours.    Invalid input(s): PC02A        Pertinent radiology reviewed.    CHEST SINGLE VIEW   Final Result         Stable support devices.      Persistent small left pleural effusion with slight improved though unresolved left lower lobe consolidation.      Stable cardiomediastinal silhouette.          Finalized by Eleanor Don, M.D. on 08/29/2024 8:28 AM. Dictated by Eleanor Don, M.D. on 08/29/2024 8:27 AM.      FEEDING TUBE PLCMNT (ABD/CHEST LMTD)   Final Result   FINDINGS/IMPRESSION:      Gastric tube courses below the diaphragm with tip projecting over the gastric antrum.      Moderate gaseous distention of the stomach. Visualized bowel gas pattern nonobstructive.       Similar left lower lobe consolidation.      By my electronic signature, I attest that I have personally reviewed the images for this examination and formulated the interpretations and opinions expressed in this report          Finalized  by Omar Pinal, D.O. on 08/28/2024 10:20 AM. Dictated by Ozell Mall, DO on 08/28/2024 9:17 AM.      CHEST SINGLE VIEW   Final Result         Endotracheal tube with the tip approximately 8 cm above the carina. This could be slightly advanced for more optimal positioning.      Gastric tube courses below the diaphragm and out of the field-of-view.      Increasing left lower lobe consolidation which could be infection, aspiration, or worsening atelectasis.          Finalized by Eleanor Don, M.D. on 08/27/2024 12:25 PM. Dictated by Eleanor Don, M.D. on 08/27/2024 12:23 PM.      FEEDING TUBE PLCMNT (ABD/CHEST LMTD)   Final Result         Gastric tube with sidehole projecting over the body of the stomach and tip projecting at the gastric antrum.       The stomach is mildly distended.       Patchy bibasilar opacities are better demonstrated on recent CT chest.      By my electronic signature, I attest that I have personally reviewed the images for this examination and formulated the interpretations and opinions expressed in this report          Finalized by Christobal SHAUNNA Sawyer, MD on 08/26/2024 1:35 PM. Dictated by Mont Lay, MD on 08/26/2024 1:28 PM.      US  VENOUS DOPPLER BILATERAL   Final Result         1.  Acute appearing nonocclusive thrombus in the left popliteal vein.      2.  No femoral/popliteal deep venous thrombosis in right lower extremity.      By my electronic signature, I attest that I have personally reviewed the images for this examination and formulated the interpretations and opinions expressed in this report          Finalized by Allean Pouch, M.D. on 08/26/2024 11:24 AM. Dictated by Mont Lay, MD on 08/26/2024 11:14 AM.      US  EXTREMITY LIMITED RIGHT   Final Result         1.  Acute appearing nonocclusive thrombus in the left popliteal vein.      2.  No femoral/popliteal deep venous thrombosis in right lower extremity.      By my electronic signature, I attest that I have personally reviewed the images for this examination and formulated the interpretations and opinions expressed in this report          Finalized by Allean Pouch, M.D. on 08/26/2024 11:24 AM. Dictated by Mont Lay, MD on 08/26/2024 11:14 AM.      CTA CHEST PULM EMBOLISM W/CONT   Final Result         CTA CHEST:      1. Multiple bilateral pulmonary emboli as described above, right greater than left. Elevated RV: LV ratio consistent with right heart strain, with dilatation of the right atrium, right ventricle and main pulmonary artery. Borderline cardiomegaly.   2. Moderate left lower lobe with additional areas of mild bilateral atelectasis, without significant pulmonary infarct or pleural effusion.      CT abdomen and pelvis:      1. Postsurgical changes of reported recent left robotic renal cyst decortication, with left perinephric stranding and multifocal gas. Small amount of free air is noted and likely postoperative. Surgical drain extending to the left lateral perinephric region, with no abdominal or pelvic fluid collection.  2. Redemonstration of marked bilateral renal enlargement with innumerable cysts, consistent with autosomal dominant polycystic kidney disease. 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   3. Bladder is decompressed about a Foley catheter, limiting evaluation.   4. Colonic diverticulosis without acute diverticulitis or bowel obstruction.   5. Trace pelvic free fluid, nonspecific though possibly postoperative.      Major preliminary findings including presence of extensive pulmonary emboli with right heart strain was conveyed to Dr. Iannazzo by Dairl messenger at 4:34 PM on 08/25/2024.          Finalized by Norleen Pane, M.D. on 08/25/2024 4:35 PM. Dictated by Norleen Pane, M.D. on 08/25/2024 4:12 PM.      CT ABD/PELV W CONTRAST   Final Result         CTA CHEST:      1. Multiple bilateral pulmonary emboli as described above, right greater than left. Elevated RV: LV ratio consistent with right heart strain, with dilatation of the right atrium, right ventricle and main pulmonary artery. Borderline cardiomegaly.   2. Moderate left lower lobe with additional areas of mild bilateral atelectasis, without significant pulmonary infarct or pleural effusion.      CT abdomen and pelvis:      1. Postsurgical changes of reported recent left robotic renal cyst decortication, with left perinephric stranding and multifocal gas. Small amount of free air is noted and likely postoperative. Surgical drain extending to the left lateral perinephric region, with no abdominal or pelvic fluid collection.   2. Redemonstration of marked bilateral renal enlargement with innumerable cysts, consistent with autosomal dominant polycystic kidney disease. 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   3. Bladder is decompressed about a Foley catheter, limiting evaluation.   4. Colonic diverticulosis without acute diverticulitis or bowel obstruction.   5. Trace pelvic free fluid, nonspecific though possibly postoperative.      Major preliminary findings including presence of extensive pulmonary emboli with right heart strain was conveyed to Dr. Iannazzo by Dairl messenger at 4:34 PM on 08/25/2024. Finalized by Norleen Pane, M.D. on 08/25/2024 4:35 PM. Dictated by Norleen Pane, M.D. on 08/25/2024 4:12 PM.      2D + DOPPLER ECHO   Final Result      CHEST SINGLE VIEW   Final Result   FINDINGS/IMPRESSION:        Support Devices: Endotracheal tube has been slightly advanced, with the tip now projecting 3 cm above the carina. This could be slightly advanced for optimal positioning.      Lungs/Pleura: Inferior aspects of both lungs, particularly the left, are excluded from the field-of-view,. Patchy bibasilar opacities, likely atelectasis. No pneumothorax.      Heart and Mediastinum: The cardiomediastinal silhouette is stable.             Finalized by JOSETTE BEAGLE, M.D. on 08/25/2024 12:48 PM. Dictated by JOSETTE BEAGLE, M.D. on 08/25/2024 12:46 PM.      CHEST SINGLE VIEW   Final Result   FINDINGS/IMPRESSION:        Support Devices: Endotracheal tube in place, with the tip projecting 0.5 cm above the carina. Advancement is recommended..      Lungs/Pleura: Inferior aspect of the left lung, and inferior right costophrenic angle are excluded from the film.SABRA Patchy bibasilar opacities, likely atelectasis. Mild pulmonary edema.. No pneumothorax.SABRA      Heart and Mediastinum: Cardiac silhouette is incompletely visualized, although likely within normal limits.  Finalized by JOSETTE BEAGLE, M.D. on 08/25/2024 12:49 PM. Dictated by JOSETTE BEAGLE, M.D. on 08/25/2024 12:48 PM.           Scheduled Meds:allopurinoL  (ZYLOPRIM ) tablet 100 mg, 100 mg, Oral, QDAY  apixaban  (ELIQUIS ) tablet 5 mg, 5 mg, Oral, BID  atorvastatin  (LIPITOR) tablet 10 mg, 10 mg, Per NG tube, QDAY  bacitracin  zinc  topical ointment, , Topical, BID  [Held by Provider] lisinopriL  (ZESTRIL ) tablet 10 mg, 10 mg, Oral, QDAY  magnesium  sulfate   1 g/D5W 100 mL IVPB, 1 g, Intravenous, Q4H*  melatonin (MELATIN) tablet 6 mg, 6 mg, Oral, QHS  pantoprazole  DR (PROTONIX ) tablet 40 mg, 40 mg, Oral, QDAY  polyethylene glycol 3350  (MIRALAX ) packet 17 g, 1 packet, Feeding Tube, QDAY  predniSONE  (DELTASONE ) tablet 40 mg, 40 mg, Oral, QDAY  sennosides-docusate sodium  (SENOKOT-S) tablet 1 tablet, 1 tablet, Per OG Tube, QDAY  tamsulosin  (FLOMAX ) capsule 0.4 mg, 0.4 mg, Oral, QDAY after breakfast    Continuous Infusions:   sodium chloride  0.45% 1,000 mL with sodium bicarbonate  75 mEq IV infusion 75 mEq (09/04/24 0918)    sodium chloride  0.9% TKO infusion 5 mL/hr at 08/31/24 0528     PRN and Respiratory Meds:[Held by Provider] acetaminophen  Q6H PRN, albuterol -ipratropium Q4H PRN, dextrose  50% PRN, pancrelipase  20,880 Units/sodium bicarbonate  650 mg ([place] CLOG DESTROYER) PRN (On Call from Rx), sodium chloride  BID PRN      Malnutrition Details:              Loss of Subcutaneous Fat: No      Muscle Wasting: No                  Active Wounds                                                        [1]   Social History  Socioeconomic History    Marital status: Divorced   Tobacco Use    Smoking status: Never    Smokeless tobacco: Former     Types: Chew     Quit date: 1990    Tobacco comments:     Chewed x10 years   Vaping Use    Vaping status: Never Used   Substance and Sexual Activity    Alcohol use: Yes     Alcohol/week: 2.0 standard drinks of alcohol     Types: 2 Cans of beer per week     Comment: Occasional    Drug use: Never   [2]   Current Facility-Administered Medications:     [Held by Provider] acetaminophen  (TYLENOL ) tablet 650 mg, 650 mg, Oral, Q6H PRN, Iannazzo, Emily G, DO, 650 mg at 09/03/24 2150    albuterol -ipratropium (DUONEB) nebulizer solution 3 mL, 3 mL, Inhalation, Q4H PRN, Iannazzo, Emily G, DO, 3 mL at 08/29/24 1307    allopurinoL  (ZYLOPRIM ) tablet 100 mg, 100 mg, Oral, QDAY, Dozier, Darian, DO, 100 mg at 09/04/24 0856    [COMPLETED] apixaban  (ELIQUIS ) tablet 10 mg, 10 mg, Oral, BID, 10 mg at 09/01/24 2015 **FOLLOWED BY** apixaban  (ELIQUIS ) tablet 5 mg, 5 mg, Oral, BID, Iannazzo, Emily G, DO, 5 mg at 09/04/24 0856    atorvastatin  (LIPITOR) tablet 10 mg, 10 mg, Per NG tube, QDAY, Iannazzo, Emily G, DO, 10 mg at 09/04/24 0856    bacitracin  zinc  topical ointment, ,  Topical, BID, Marty Toribio SAUNDERS, MD, Given at 09/04/24 9081    dextrose  50% (D50) syringe 25-50 mL, 12.5-25 g, Intravenous, PRN, Dodd, Danica, MD    [facility] by Provider] lisinopriL  (ZESTRIL ) tablet 10 mg, 10 mg, Oral, QDAY, Iannazzo, Emily G, DO, 10 mg at 09/03/24 0912    [COMPLETED] magnesium  sulfate   1 g/D5W 100 mL IVPB, 1 g, Intravenous, Q4H*, Last Rate: 25 mL/hr at 09/04/24 0856, 1 g at 09/04/24 0856 **FOLLOWED BY** magnesium  sulfate   1 g/D5W 100 mL IVPB, 1 g, Intravenous, Q4H*, Michaila Kenney, Alois, MD, Last Rate: 25 mL/hr at 09/04/24 1256, 1 g at 09/04/24 1256    melatonin (MELATIN) tablet 6 mg, 6 mg, Oral, QHS, Iannazzo, Emily G, DO, 6 mg at 09/03/24 2150    pancrelipase  20,880 Units/sodium bicarbonate  650 mg ([place] CLOG DESTROYER), , Feeding Tube, PRN (On Call from Rx), Dodd, Danica, MD    pantoprazole  DR (PROTONIX ) tablet 40 mg, 40 mg, Oral, QDAY, Provider, Pharmacy, 40 mg at 09/04/24 0856    polyethylene glycol 3350  (MIRALAX ) packet 17 g, 1 packet, Feeding Tube, QDAY, Iannazzo, Emily G, DO, 17 g at 08/31/24 0800    predniSONE  (DELTASONE ) tablet 40 mg, 40 mg, Oral, QDAY, Dozier, Darian, DO, 40 mg at 09/04/24 1116    sennosides-docusate sodium  (SENOKOT-S) tablet 1 tablet, 1 tablet, Per OG Tube, QDAY, Iannazzo, Emily G, DO, 1 tablet at 08/31/24 0800    sodium chloride  (NEBUSAL) 3 % nebulizer solution 4 mL, 4 mL, Inhalation, BID PRN, Antonetta Elspeth RODES, MD    sodium chloride  0.45% 1,000 mL with sodium bicarbonate  75 mEq IV infusion, 75 mEq, Intravenous, Continuous, Agapito Ned, DO, Last Rate: 100 mL/hr at 09/04/24 0918, 75 mEq at 09/04/24 9081    sodium chloride  0.9% TKO infusion, , Intravenous, Continuous, Jobe, Amanda L, MD, Last Rate: 5 mL/hr at 08/31/24 0528, New Bag at 08/31/24 9471    tamsulosin  (FLOMAX ) capsule 0.4 mg, 0.4 mg, Oral, QDAY after breakfast, Nyle Tinnie SAILOR, MD, 0.4 mg at 09/04/24 [id number]

## 2024-09-04 NOTE — Progress Notes [1]
 OCCUPATIONAL THERAPY  PROGRESS NOTE      Name: Matthew Paul   MRN: [id number]     DOB: 1966-09-09      Age: 58 y.o.  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 Postoperative course complicated by pulmonary embolism resulting in cardiac arrest (11/15) with subsequent ROSC. Extubated 11/21  Special Considerations: Proximal muscle weakness  Mental / Cognitive: Alert;Oriented;Cooperative  Pain: No complaint of pain  Persons Present: Nursing Staff;Rehabilitation technician    Home Living Situation  Lives With: Spouse/significant other  Comments: lives with girlfriend [name]  Type of Home: House  Entry Stairs: 2  Comments: small steps, no hand rail  In-Home Stairs: Able to live on main level;IADL in basement  Comments: basement laundry  Patient Owned Equipment: None    Prior Level of Function  Level Of Independence: Independent with ADL and community mobility without device  Comments: Reports being independent previously. No AD.  History of Falls in Past 3 Months: No    ADL's  Where Assessed: Supine, Bed  LE Dressing Assist: Total Assist  LE Dressing Deficits: Don/Doff R Sock;Don/Doff L Sock  Toileting Assist: Total Assist  Toileting Deficits: Perineal Hygiene  Comment: Able to partially complete peri care with patient in partial stand at EOB. Peri care completed upon return to supine.    ADL Mobility  Bed Mobility: Supine to Sit: Maximum assist;x2 people  Bed Mobility: Sit to Supine: Maximum assist;x2 people  Transfer Type: Sit to/from stand  Transfer: Assistance Level: To/from;Bed;Maximum assist;x2 people  Transfer: Assistive Device: Hand hold assist  Transfer: Type of Assistance: Elevated bed;For balance;For safety considerations;For strength deficit;Requires extra time;Knee(s) blocked  Other Transfer Type: Horizontal (scooting to [facility])  Other Transfer: Assistance Level: Minimal assist  Other Transfer: Assistive Device: None  Other Transfer: Type of Assistance: For safety considerations;Knee(s) blocked  End of Activity Status: In bed;Instructed patient to request assist with mobility;Instructed patient to use call light  Transfer Comments: Patient able to sit at EOB with SBA. Completed 3 trials of standing at EOB. Unable to come to full upright standing position, but was able to clear his bottom from the bed enough to partially complete peri care. Patient then was able to scoot to the [facility] twice with bilateral knees blocked and increased time for completing scooting. Patient requires assistance to move his feet over after each scoot due to BLE weakness.  Sitting Balance: Static sitting balance;Standby assist    Activity Tolerance  Endurance: 3/5 Tolerates 25-30 Minutes Exercise w/Multiple Rests  Comment: Activity limited primarily by weakness. Patient was motivated to work with therapy.    Assessment  Assessment: Decreased ADL Status;Decreased Self-Care Trans;Decreased High-Level ADLs;Decreased Endurance  Prognosis: Good;w/Cont OT s/p Acute Discharge    AM-PAC 6 Clicks Daily Activity Inpatient  Putting on and taking off regular lower body clothes: Total  Bathing (Including washing, rinsing, drying): A Lot  Toileting, which includes using toilet, bedpan, or urinal: Total  Putting on and taking off regular upper body clothing: A Lot  Taking care of personal  grooming such as brushing teeth: A Little  Eating meals: A Little  Daily Activity Raw Score: 12  Standardized (T-scale) Score: 30.6    Plan  Progress: Progressing Toward Goals  OT Frequency: 2-3x/week  OT Plan for Next Visit: Grooming at EOB, functional sit/stands.    ADL Goals  Patient Will Perform Grooming: w/ Stand By Assist;at Edge of Bed  Patient Will Perform Toileting: w/ Moderate Assist    Functional Transfer Goals  Pt Will Transfer To Bedside Commode: w/ Moderate Assist    OT Discharge Recommendations  Recommendation: Inpatient setting  Patient Currently Requires Physical Assist With: All mobility;All personal care ADLs;All home functioning ADLs      Therapist: Rocky Slain, MOTR/L  Date: 09/04/2024

## 2024-09-05 ENCOUNTER — Inpatient Hospital Stay: Admit: 2024-09-05 | Discharge: 2024-09-05 | Payer: BLUE CROSS/BLUE SHIELD

## 2024-09-05 ENCOUNTER — Encounter: Admit: 2024-09-05 | Discharge: 2024-09-05 | Payer: BLUE CROSS/BLUE SHIELD

## 2024-09-05 LAB — BASIC METABOLIC PANEL
~~LOC~~ BKR ANION GAP: 10 (ref 3–12)
~~LOC~~ BKR ANION GAP: 10 (ref 3–12)
~~LOC~~ BKR BLD UREA NITROGEN: 48 mg/dL — ABNORMAL HIGH (ref 7–25)
~~LOC~~ BKR CALCIUM: 8.8 mg/dL (ref 8.5–10.6)
~~LOC~~ BKR CHLORIDE: 108 mmol/L (ref 98–110)
~~LOC~~ BKR CO2: 17 mmol/L — ABNORMAL LOW (ref 21–30)
~~LOC~~ BKR CREATININE: 2 mg/dL — ABNORMAL HIGH (ref 0.40–1.24)
~~LOC~~ BKR GLOMERULAR FILTRATION RATE (GFR): 36 mL/min — ABNORMAL LOW (ref >60)
~~LOC~~ BKR GLOMERULAR FILTRATION RATE (GFR): 33 mL/min — ABNORMAL LOW (ref >60)
~~LOC~~ BKR GLUCOSE, RANDOM: 106 mg/dL — ABNORMAL HIGH (ref 70–100)
~~LOC~~ BKR POTASSIUM: 5.6 mmol/L — ABNORMAL HIGH (ref 3.5–5.1)
~~LOC~~ BKR SODIUM, SERUM: 135 mmol/L — ABNORMAL LOW (ref 137–147)

## 2024-09-05 LAB — GGTP: ~~LOC~~ BKR GGTP: 605 U/L — ABNORMAL HIGH (ref 9–64)

## 2024-09-05 LAB — SODIUM-URINE RANDOM: ~~LOC~~ BKR UR SODIUM, RAN: 49 mmol/L

## 2024-09-05 LAB — CREATININE-URINE RANDOM: ~~LOC~~ BKR UR CREATININE, RAN: 50 mg/dL

## 2024-09-05 MED ORDER — LACTATED RINGERS IV BOLUS
1000 mL | Freq: Once | INTRAVENOUS | 0 refills | Status: CP
Start: 2024-09-05 — End: ?

## 2024-09-05 MED ADMIN — LACTATED RINGERS IV SOLP [4318]: 1000 mL | INTRAVENOUS | @ 14:00:00 | Stop: 2024-09-05 | NDC 00338011704

## 2024-09-05 NOTE — Case Mgmt DC Plan [600024]
 Case Management Progress Note    NAME:Matthew Paul                          MRN: [id number]              DOB:06-21-1966          AGE: 58 y.o.  ADMISSION DATE: 08/24/2024             DAYS ADMITTED: LOS: 11 days      Today's Date: 09/05/2024    PLAN: Discharge to [facility] pending insurance authorization.    Expected Discharge Date: 09/06/2024   Is Patient Medically Stable: Yes   Are there Barriers to Discharge? Yes [employer] authorization)    INTERVENTION/DISPOSITION:  Discharge Planning              Discharge Planning: Inpatient Rehabilitation  SW reviewed EMR & participated in [place] huddle.Pt is medically stable for progression of care.   PT/OT continue with recommendation of inpatient setting.   SW followed up on the the following pending IPR referrals:  [facility] (pt/family preference) - Accepted. Spoke with Oneil [phone])  [facility] - Accepted  [facility] - Accepted  0935: SW faxed most recent PT/OT notes and PM&R note to [facility] I[facility]. Oneil will submit for insurance authorization.  SW updated pt's daughter [name] [phone]) via phone.   SW will added pt to SW [place] Report for f/u.  Transportation              Does the Patient Need Case Management to Arrange Discharge Transport? (ex: facility, ambulance, wheelchair/stretcher, Medicaid, cab, other): Yes  Type of Transport: Wheelchair fleeta  Will the Patient Use Family Transport?: No  Transportation Name, Phone and Availability #1: [name]  [phone]  Support              Support: Pt/Family Updates re:POC or DC Plan, Patient Education, Huddle/team update  Info or Referral                 Positive SDOH Domains and Potential Barriers     Medication Needs    Financial                 Legal                 Other                 Discharge Disposition    Selected Continued Care - Admitted Since 08/24/2024       [place] Destination Coordination complete.      Service Provider Services Address Phone Fax Patient Preferred    [facility]  Inpatient Rehabilitation [address], [place]  [place] [address] [phone] --                  Eleanor Copa, LMSW  Available on Voalte  Work Cell: [phone]

## 2024-09-05 NOTE — Progress Notes [1]
 PHYSICAL THERAPY  PROGRESS NOTE          Name: Matthew Paul   MRN: [id number]     DOB: Sep 29, 1966      Age: 58 y.o.  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 Postoperative course complicated by pulmonary embolism resulting in cardiac arrest (11/15) with subsequent ROSC. Extubated 11/21  Special Considerations: Proximal muscle weakness  Mental / Cognitive: Alert;Oriented;Cooperative  Pain: Complains of pain;Does not rate pain  Pain level: During activity  Pain Location: Bilateral;Ankle  Pain Interventions: Patient agrees to participate in therapy with current pain level;Treatment altered to patient's pain tolerance  Persons Present: Rehabilitation technician;Nursing Staff    Home Living Situation  Lives With: Spouse/significant other  Comments: lives with girlfriend [name]  Type of Home: House  Entry Stairs: 2  Comments: small steps, no hand rail  In-Home Stairs: Able to live on main level;IADL in basement  Comments: basement laundry  Patient Owned Equipment: None    Prior Level of Function  Level Of Independence: Independent with ADL and community mobility without device  Comments: Reports being independent previously. No AD.  History of Falls in Past 3 Months: No    Bed Mobility/Transfer  Bed Mobility: Supine to Sit: Standby Assist;Head of Bed Elevated;Verbal Cues;Use of Rail;Requires Extra Time  Bed Mobility: Sit to Supine: Minimal Assist;HOB Elevated;Verbal Cues;Use of Rail;Assist with L LE  Transfer Type: Sit to/from Stand (x 3 reps)  Transfer: Assistance Level: To/From;Bed;Moderate Assist;x2 People  Transfer: Assistive Device: Advice Worker  Transfers: Type Of Assistance: Materials Engineer;For Safety Considerations;Elevated Bed;For Balance;For Strength Deficit  End Of Activity Status: In Bed;Nursing Notified;Instructed Patient to Request Assist with Mobility;Instructed Patient to Use Call Light    Balance  Sitting Balance: Standby Assist  Standing Balance: Static Standing Balance;2 UE support;Minimal Assist;x2 People;Dynamic Standing Balance;Moderate Assist    Gait  Gait Distance:  (able to take 2 side steps but difficult to complete.)  Gait: Assistance Level: Moderate Assist;x2 People  Gait: Assistive Device: Nurse, Adult  Activity Limited By: Complaint of Pain;Complaint of Fatigue;Weakness    Activity/Exercise  Sit Edge Of Bed: 15 minutes  Sit Edge Of Bed Assist: Stand By Assist  Stand At Bedside : 1 minutes (over 3 stands)  Stand At Bedside Assist: Moderate  Assist;x2 People    Assessment/Progress  Impaired Mobility Due To: Pain;Decreased Activity Tolerance;Deconditioning;Decreased Strength  Impaired Strength Due To: Deconditioning;Decreased Activity Tolerance  Assessment/Progress: Should Improve w/ Continued PT    AM-PAC 6 Clicks Basic Mobility Inpatient  Turning from your back to your side while in a flat bed without using bed rails: A Little  Moving from lying on your back to sitting on the side of a flat bed without using bedrails : A Little  Moving to and from a bed to a chair (including a wheelchair): A Lot  Standing up from a chair using your arms (e.g. wheelchair, or bedside chair): A Lot  To walk in hospital room: Total  Climbing 3-5 steps with a railing: Total  Basic Mobility  Inpatient Raw Score: 12  Standardized (T-scale) Score: 32.23  Mobility Goal [facility]: JH-HLM 4 Move to Chair    Goals  Goal Formulation: With Patient  Time For Goal Achievement: 7 days, To  Patient Will Go Supine To Sit: w/ Stand By Assist  Patient Will Go Sit to Supine : w/ Stand By Assist  Patient Will Transfer Sit to Stand: w/ Stand By Assist  Patient Will Ambulate: 101-150 Feet, w/ Vannie, w/ Stand By Assist  Patient Will Go Up / Down Stairs: 1-2 Stairs, w/ Stand By Assist    Plan  Treatment Interventions: Mobility training;Strengthening;Balance activities;Endurance training  Plan Frequency: 3-5 Days per Week  PT Plan for Next Visit: Sit to stand, pre-gait, up to chair    PT Discharge Recommendations  Recommendation: Inpatient setting  Patient Currently Requires Physical Assist With: All mobility      Therapist: Eleanor Gall, PT, DPT  Date: 09/05/2024

## 2024-09-05 NOTE — Case Mgmt DC Plan [600024]
 Per discussion with Melissa H. (SW), patient can be removed from [place] IPR review list, as has been accepted to alternate IRF.      E.Jahron Hunsinger, Inpatient Admissions Nurse/Rehab. (office# [phone] or voalte).

## 2024-09-05 NOTE — Progress Notes [1]
 MEDICAL INTENSIVE CARE UNIT  PROGRESS NOTE       Patient's Name:  Matthew Paul MRN: [id number]   Patient's DOB: 1965/12/02   Today's Date:  09/05/2024  Admission Date: 08/24/2024  LOS: 11 days    Problem list  Principal Problem:    Acquired bilateral renal cysts  Active Problems:    ADPKD (autosomal dominant polycystic kidney disease)    Cardiac arrest (CMS-HCC)    Acute kidney injury superimposed on CKD    Obstructive cardiovascular shock (CMS-HCC)    Lactic acidosis    High anion gap metabolic acidosis    Acute pulmonary embolism with acute cor pulmonale (CMS-HCC)    Acute respiratory failure with hypoxia (CMS-HCC)      HOSPITAL COURSE SUMMARY     Matthew Paul is a 58 y.o. 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  Patient admitted initially on 11/14 for planned left robotic renal cyst decortication and cystoscopy with ureteral stent placement.  No complications during procedure and patient had existing JP drain placed.  Unfortunately, patient suffered PEA cardiac arrest on 11/15 with ROSC following 2 rounds of epinephrine .    Patient was transported to the ICU on 11/15 and initially required high vent settings [PEEP 10, FiO2 100%] and pressor support with NE and vaso.  Cardiology consulted for abnormal EKG with new RBBB.  Mild metabolic derangements on initial labs without electrolyte disturbances.  Formal TTE stat with EF 55%, grade 1 diastolic dysfunction, and moderate to severely dilated LV with PASP 34.  CTA chest 11/15 showing massive bilateral PE, right greater than left with significant RV strain.  CT AP without evidence of hemorrhage or acute abdominal pathology.  After much discussion with family regarding risk/benefit, started on therapeutic heparin  drip 11/15. The patient continued to be intubated and sedated due to high ventilation requirements over the next several days. Antibiotics were begun on 11/17 given fever and possible pneumonia on CXR. Switched from heparin  gtt to eliquis  on 11/20.  He was extubated on 11/21 and no longer required pressors. Downgraded to floor status on 11/22.     Interval update 09/05/2024:  > Continued gout pain in bilateral ankles and feet,continuing prednisone  40 mg qday  x5 days  >AKI worsening although per chart review appears this increase may be a return to prior baseline  >FENa 1.7  >1L LR  >Holding lisinopril  per AKI  >Afternoon BMP  >worsening transaminitis, cholestatic pattern. Known many liver cysts. Holding acetaminophen  & atorvastatin .  >GGTP 605  >Liver US  ordered   ASSESSMENT & PLAN     NEURO/PSYCH  #Delirium  - Initially post-code: moving all extremities, opening eyes, attempting to pull at tube prior to sedation   - A+O x4 post-extubation, no focal deficits  PLAN:  > Intermittent confusion overnight, oriented on exam this morning  > Delirium precautions    -----------------------------------------------------------------------------------------------------------------------------------------------------------------------------------------------------------------------  PULMONARY  #Mechanical intubation for airway protection s/p extubation 11/21  #Bilateral massive PE  - Intubated on 11/15 post PEA arrest. ETT advanced to 28 at the teeth   - Bedside POCUS concerning for RV overload and McConnell's sign.   - TTE: RV overload further c/f PE  - CTA Chest: massive multiple BL PE, R>L. Elevated RV:LV c/w right heart strain w/ dilation of RA, RV, main PA  - Result of long discussion with family, pharmacy, interventional radiology, regarding overall  multidisciplinary approach and decision for tPA versus heparin  was to initiate heparin .  Discussed risks/benefit with family at length especially given his recent surgical procedure yesterday and existing JP drain, along with potential for existing cerebral aneurysms that are often associated with autosomal dominant PKD.  They understand this and would like to continue forward with heparin  GGT.    PLAN:  > Continue pulmonary hygiene: flutter valve, duo nebs, hypertonic saline   > eliquis  10 mg BID through 11/24, now 5 mg BID     #Suspected left lower lobe pneumonia  - MRSA nares +  - 11/17 CXR: Increasing left lower lobe consolidation which could be infection, aspiration, or worsening atelectasis.   - s/p 4 day course IV steroids  -vancomycin  (MRSA nares +) and zosyn  (11/17-11/24) to treat pneumonia empirically due to fever and potential pneumonia on CXR  PLAN:  > Incentive spirometry, duonebs, and sodium chloride  3% nebulizers for airway clearance  -----------------------------------------------------------------------------------------------------------------------------------------------------------------------------------------------------------------------  CARDIOVASCULAR  #PEA arrest   #Shock - obstructive 2/2 PE , resolved  #Remote history of Afib (1980s) not on AC   #Volume Overload  - PEA arrest on 11/15, ROSC after 2 rounds of epi and total code lasting 4-6 minutes.  - PTA: ASA 81 mg daily  - EKG:               Previous OSH 2020: SR, Qtc 406               EKG (11/15 post code): Sinus tachy 100s, Qtc 452, new RBBB  - Pressors: NE, vaso   - Echo (11/15): EF 55%. No dia dysfunction. Mod to severely enlarged RV w/ intraventricular septal flattening. PASP 35 + CVP. No major valvular abnormalities.   - CTA Chest: massive bl PE with RV strain.   - CT AP: normal post-surgical changes.   - Trop: 138   PLAN:  > BP stable     #HLD   - PTA: atorvastatin  20 mg   - Lipid profile: chol 124, TG 208, HDL 33, LDL 82  PLAN:  > holding atorvastatin      #HTN  - PTA meds: HCTZ 25 mg qAM, lisinopril  20 mg daily  PLAN:  > Holding lisinopril  10 mg qAM per AKI  > HOLD HCTZ  due to hypotension    -----------------------------------------------------------------------------------------------------------------------------------------------------------------------------------------------------------------------  FEN/GI  #Elevated LFTs  #GERD  #Constipation, resolved  - Post code LFTs AST 157 (13 on admit), ALT 163 (12 on admit). Tbili 0.4.  - PTA meds: omeprazole 40 mg daily   - GGTP 605  PLAN:  >Elevated LFTs initially resolved 11/17 and began to elevate again 11/22. Cholestatic injury pattern. Known numerous liver cysts. Ordered Liver US   > Daily CMP   > SLP evaluated: advanced diet to regular, thin liquids 11/24  > Continue PPI daily  > Bowel regimen PRN, Imodium  PRN due to frequent stools post extubation    -----------------------------------------------------------------------------------------------------------------------------------------------------------------------------------------------------------------------  RENAL  #ADPKD  #AKI on CKD   #HAGMA, mild likely 2/2 above further renal dysfunction  - PTA meds: jynarque  (valptan) 60 mg qAM / 30 mg qPM  - Baseline Cr 2.5 - 2.8   -11/14 underwent left robotic renal cyst decortication, cystoscopy, L ureteral stent placement for enlarging bilateral renal size causing pressure and discomfort in the setting of ADPKD.  Total of roughly 100 cysts were decorticated with 19 large cysts sent for pathology.  Placement of left ureteral stent, confirmation prior to surgical completion that left ureter had no urine leak.  No  overt post surgical complications.  - CT AP (11/15): normal post-operative changes  -JP drain removed by urology 11/23  -11/26 FENa 1.7  Recent Labs     09/04/24  0406 09/04/24  2114 09/05/24  0407   CR 2.15* 2.07* 2.42*   BUN 47* 48* 53*   GFR 35* 36* 30*     - Net IO Since Admission: -4,398.82 mL [09/05/24 1559]  -   Intake/Output Summary (Last 24 hours) at 09/05/2024 1559  Last data filed at 09/05/2024 1200  Gross per 24 hour Intake 2400 ml   Output 3500 ml   Net -1100 ml   PLAN:  > 11/23 urology started flomax  and recommend maintain foley placement today and trial removal when he is more mobile in the coming days with PT/OT   > Continue strict I's and O's, avoid further nephrotoxic agents, renally dose medications as able.  > Likely ATN post diuresis, monitoring I/O carefully. Replacing fluids as per interval update.    -----------------------------------------------------------------------------------------------------------------------------------------------------------------------------------------------------------------------  ENDOCRINE  #No acute concerns   PLAN:  > CTM    -----------------------------------------------------------------------------------------------------------------------------------------------------------------------------------------------------------------------  HEME/ONC  #DVT/PE  #Normocytic Anemia  #Thrombocytopenia  - 11/16 doppler BLE: acute appearing nonocclusive thrombus in L popliteal vein  Recent Labs     09/03/24  0405 09/04/24  0406 09/05/24  0407   HGB 9.3* 10.1* 9.6*   MCV 97.4 94.1 93.5   PLTCT 229 252 282   PLAN:  > Monitor HGB, Transfuse if Hgb <7 and PLT < 10   > Eliquis  10 mg BID through 11/24, now 5 mg BID   > No evidence of acute bleeding. Ongoing monitor for s/s of bleeding.     #Leukocytosis, likely reactive, resolved  - Initial leukocytosis downtrended, consistent with reactive leukocytosis due to surgery/cardiac arrest, until elevation on 11/18  PLAN:   > Likely due to initiation of steroids, last dose on 11/20, continue to monitor with daily CBC   -Further steroids starting 11/25 for gout  -----------------------------------------------------------------------------------------------------------------------------------------------------------------------------------------------------------------------  ID  #Febrile  #Suspected Left Lower Lobe Pneumonia  - Last febrile temperature at 2000 on 11/17  - MRSA nares +  - 11/17 CXR: Increasing left lower lobe consolidation which could be infection, aspiration, or worsening atelectasis.   - s/p 4 day course IV steroids   Recent Labs     09/03/24  0405 09/04/24  0406 09/05/24  0407   WBC 12.30* 12.90* 14.00*     - Temp (24hrs), Avg:37 ?C (98.6 ?F), Min:36.8 ?C (98.3 ?F), Max:37.4 ?C (99.4 ?F)      Culture Data Date collected Result   Blood cx 11/17 NG x 5d   Sputum cx 11/17 Normal oropharyngeal flora   Urine cx 11/17 NG x 24h   Blood cx 11/20 NG 3d     Antimicrobial First dose Last dose Comment   Ancef  11/14 11/14 X2, for perioperative abx   Vancomycin  11/17 11/24    Zosyn  11/17 11/24      PLAN:  > Follow cultures as above  > Tylenol  PRN for fever -> held per transaminitis    ----------------------------------------------------------------------------------------------------------------------------------------------------------------------------------------------------------------------  MSK/DERM  #Gout   PLAN:  > allopurinol  100 mg qday   > prednisone  40 mg qday  x5 days for increasing gout pain in bilateral ankles and feet    -----------------------------------------------------------------------------------------------------------------------------------------------------------------------------------------------------------------------  LDA/PROPHYLAXIS  Patient Lines/Drains/Airways Status       Active Lines:       Name Placement date Placement time Site Days    Indwelling Urinary Catheter  16 FR Standard 2-way 09/01/24  1730  -- 4    Peripheral IV 08/31/24 1342 Right Mid Upper Arm 20 G 08/31/24  1342  -- 5    Peripheral IV 08/31/24 1452 Left Mid Forearm 20 G 08/31/24  1452  -- 5                    Lines: PIV x2  Tubes: None  Urinary Catheter:  Yes  VTE ppx: SCDs, eliquis   GI:  PPI  Bowel regimen: miralax  and senna  Diet: Regular Diet  DIET REGULAR  Code status: Full Code     Patient discussed with Elspeth ELINORE Pesa, MD     Alois Hamlet, MD  Internal Medicine PGY-1   Available on Voalte      SUBJECTIVE     No acute events overnight. Feels his bilateral ankles and feet's gout pain is improved. Feels he continues to improve overall.  No abdominal pain.   Past Medical History:    Acid reflux    ADPKD (autosomal dominant polycystic kidney)    Apnea    Gout    Hyperlipidemia    Hypertension    Mild shortness of breath    Paroxysmal A-fib (CMS-HCC)     Surgical History:   Procedure Laterality Date    HX HERNIA REPAIR Right 1983    ROBOTIC LAPAROSCOPIC ABLATION RENAL CYSTS Right 06/08/2019    Performed by Erskin Lenis, MD at [facility] OR    ROBOT ASSISTED SURGERY N/A 06/08/2019    Performed by Erskin Lenis, MD at [facility] OR    CYSTOURETHROSCOPY WITH INDWELLING URETERAL STENT INSERTION Right 06/08/2019    Performed by Erskin Lenis, MD at [facility] OR    ROBOTIC ASSISTED LAPAROSCOPIC RENAL CYST DECORTICATION Left 10/02/2019    Performed by Erskin Lenis, MD at [facility] OR    CYSTOURETHROSCOPY WITH INDWELLING URETERAL STENT INSERTION Left 10/02/2019    Performed by Erskin Lenis, MD at [facility] OR    ABLATION, CYST, KIDNEY, LAPAROSCOPIC Left 08/24/2024    Performed by Erskin Lenis LABOR, MD at [facility] OR    ROBOT ASSISTED SURGERY Left 08/24/2024    Performed by Erskin Lenis LABOR, MD at [facility] OR    CYSTOURETHROSCOPY, WITH INDWELLING URETERAL STENT INSERTION Left 08/24/2024    Performed by Erskin Lenis LABOR, MD at [facility] OR    HX TONSILLECTOMY      KIDNEY CYST REMOVAL      Multiple    KNEE CARTILAGE SURGERY Bilateral      No family history on file.  Social History[1]  Vaping/E-liquid Use    Vaping Use Never User      Vaping/E-liquid Substances    CBD No     Nicotine No     Other No     Flavored No     THC No     Unknown No             Current Medications[2]       OBJECTIVE                          Vital Signs: Last Filed                 Vital Signs: 24 Hour Range   BP: 121/77 (11/26 1200)  Temp: 37 ?C (98.6 ?F) (11/26 1200)  Pulse: 97 (11/26 1200)  Respirations: 22 PER MINUTE (11/26 1200)  SpO2: 92 % (11/26 1200)  O2 Device: Nasal cannula (  11/26 1200)  O2 Liter Flow: 2 Lpm (11/26 1200) BP: (106-149)/(67-82)   Temp:  [36.8 ?C (98.3 ?F)-37.4 ?C (99.4 ?F)]   Pulse:  [81-97]   Respirations:  [16 PER MINUTE-22 PER MINUTE]   SpO2:  [92 %-97 %]   O2 Device: Nasal cannula  O2 Liter Flow: 2 Lpm     Vitals:    08/26/24 1000 08/29/24 0400 08/31/24 1718   Weight: (!) 144.2 kg (317 lb 14.5 oz) (!) 142.1 kg (313 lb 4.4 oz) 135.2 kg (298 lb 1.6 oz)         Artificial Airway   None       Ventilator/Respiratory Therapy  None    Vent Weaning   Not applicable    Physical Exam  Constitutional: 58 y.o. male  A + O x4  Head: Normocephalic, atraumatic  Cardiovascular: Regular rhythm, tachycardic 90-100s rate  Pulmonary: Clear to auscultation bilaterally, no wheezes or rales. Breathing well on RA  GI: Abdomen soft, non-tender, non-distended (baseline habitus), bowel sounds +. Negative Murphy's sign.  Skin: No rashes or bruises, good turgor, cap refill <2s  Neuro:  Moves all extremities, no focal deficit  Musculoskeletal: No deformities  Lymphatic/extremities: Bilateral ankles and feet appear slightly edematous. No erythema but increased calor at ankles.    Laboratory:  Recent Labs     09/03/24  0045 09/03/24  0405 09/04/24  0406 09/04/24  2114 09/05/24  0407   NA 143 142 136* 135* 137   K 3.6 3.8 3.9 5.6* 4.2   CL 117* 117* 108 108 106   CO2 14* 16* 16* 17* 19*   GAP 12 9 12 10 12    BUN 49* 48* 47* 48* 53*   CR 1.95* 1.85* 2.15* 2.07* 2.42*   GLU 117* 119* 96 106* 121*   CA 8.0* 8.2* 9.3 8.8 9.4   ALBUMIN   --  2.8* 3.1*  --  2.9*   MG 2.0 1.8 1.4*  --  2.1       Recent Labs     09/03/24  0405 09/04/24  0406 09/05/24  0407   WBC 12.30* 12.90* 14.00*   HGB 9.3* 10.1* 9.6*   HCT 28.6* 30.4* 29.0*   PLTCT 229 252 282   AST 59* 51* 97*   ALT 55 59* 105*   ALKPHOS 155* 206* 291*      Estimated Creatinine Clearance: 51.3 mL/min (A) (by C-G formula based on SCr of 2.42 mg/dL (H)).  Vitals:    08/26/24 1000 08/29/24 0400 08/31/24 1718   Weight: (!) 144.2 kg (317 lb 14.5 oz) (!) 142.1 kg (313 lb 4.4 oz) 135.2 kg (298 lb 1.6 oz)      No results for input(s): PHART, PO2ART in the last 72 hours.    Invalid input(s): PC02A        Pertinent radiology reviewed.    CHEST SINGLE VIEW   Final Result         Stable support devices.      Persistent small left pleural effusion with slight improved though unresolved left lower lobe consolidation.      Stable cardiomediastinal silhouette.          Finalized by Eleanor Don, M.D. on 08/29/2024 8:28 AM. Dictated by Eleanor Don, M.D. on 08/29/2024 8:27 AM.      FEEDING TUBE PLCMNT (ABD/CHEST LMTD)   Final Result   FINDINGS/IMPRESSION:      Gastric tube courses below the diaphragm with tip projecting over the gastric antrum.  Moderate gaseous distention of the stomach. Visualized bowel gas pattern nonobstructive.       Similar left lower lobe consolidation.      By my electronic signature, I attest that I have personally reviewed the images for this examination and formulated the interpretations and opinions expressed in this report          Finalized by Omar Pinal, D.O. on 08/28/2024 10:20 AM. Dictated by Ozell Mall, DO on 08/28/2024 9:17 AM.      CHEST SINGLE VIEW   Final Result         Endotracheal tube with the tip approximately 8 cm above the carina. This could be slightly advanced for more optimal positioning.      Gastric tube courses below the diaphragm and out of the field-of-view.      Increasing left lower lobe consolidation which could be infection, aspiration, or worsening atelectasis.          Finalized by Eleanor Don, M.D. on 08/27/2024 12:25 PM. Dictated by Eleanor Don, M.D. on 08/27/2024 12:23 PM.      FEEDING TUBE PLCMNT (ABD/CHEST LMTD)   Final Result         Gastric tube with sidehole projecting over the body of the stomach and tip projecting at the gastric antrum.       The stomach is mildly distended.       Patchy bibasilar opacities are better demonstrated on recent CT chest.      By my electronic signature, I attest that I have personally reviewed the images for this examination and formulated the interpretations and opinions expressed in this report          Finalized by Christobal SHAUNNA Sawyer, MD on 08/26/2024 1:35 PM. Dictated by Mont Lay, MD on 08/26/2024 1:28 PM.      US  VENOUS DOPPLER BILATERAL   Final Result         1.  Acute appearing nonocclusive thrombus in the left popliteal vein.      2.  No femoral/popliteal deep venous thrombosis in right lower extremity.      By my electronic signature, I attest that I have personally reviewed the images for this examination and formulated the interpretations and opinions expressed in this report          Finalized by Allean Pouch, M.D. on 08/26/2024 11:24 AM. Dictated by Mont Lay, MD on 08/26/2024 11:14 AM.      US  EXTREMITY LIMITED RIGHT   Final Result         1.  Acute appearing nonocclusive thrombus in the left popliteal vein.      2.  No femoral/popliteal deep venous thrombosis in right lower extremity.      By my electronic signature, I attest that I have personally reviewed the images for this examination and formulated the interpretations and opinions expressed in this report          Finalized by Allean Pouch, M.D. on 08/26/2024 11:24 AM. Dictated by Mont Lay, MD on 08/26/2024 11:14 AM.      CTA CHEST PULM EMBOLISM W/CONT   Final Result         CTA CHEST:      1. Multiple bilateral pulmonary emboli as described above, right greater than left. Elevated RV: LV ratio consistent with right heart strain, with dilatation of the right atrium, right ventricle and main pulmonary artery. Borderline cardiomegaly.   2. Moderate left lower lobe with additional areas of mild bilateral atelectasis, without significant pulmonary  infarct or pleural effusion.      CT abdomen and pelvis:      1. Postsurgical changes of reported recent left robotic renal cyst decortication, with left perinephric stranding and multifocal gas. Small amount of free air is noted and likely postoperative. Surgical drain extending to the left lateral perinephric region, with no abdominal or pelvic fluid collection.   2. Redemonstration of marked bilateral renal enlargement with innumerable cysts, consistent with autosomal dominant polycystic kidney disease. 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   3. Bladder is decompressed about a Foley catheter, limiting evaluation.   4. Colonic diverticulosis without acute diverticulitis or bowel obstruction.   5. Trace pelvic free fluid, nonspecific though possibly postoperative.      Major preliminary findings including presence of extensive pulmonary emboli with right heart strain was conveyed to Dr. Iannazzo by Dairl messenger at 4:34 PM on 08/25/2024.          Finalized by Norleen Pane, M.D. on 08/25/2024 4:35 PM. Dictated by Norleen Pane, M.D. on 08/25/2024 4:12 PM.      CT ABD/PELV W CONTRAST   Final Result         CTA CHEST:      1. Multiple bilateral pulmonary emboli as described above, right greater than left. Elevated RV: LV ratio consistent with right heart strain, with dilatation of the right atrium, right ventricle and main pulmonary artery. Borderline cardiomegaly.   2. Moderate left lower lobe with additional areas of mild bilateral atelectasis, without significant pulmonary infarct or pleural effusion.      CT abdomen and pelvis:      1. Postsurgical changes of reported recent left robotic renal cyst decortication, with left perinephric stranding and multifocal gas. Small amount of free air is noted and likely postoperative. Surgical drain extending to the left lateral perinephric region, with no abdominal or pelvic fluid collection.   2. Redemonstration of marked bilateral renal enlargement with innumerable cysts, consistent with autosomal dominant polycystic kidney disease. 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   3. Bladder is decompressed about a Foley catheter, limiting evaluation.   4. Colonic diverticulosis without acute diverticulitis or bowel obstruction.   5. Trace pelvic free fluid, nonspecific though possibly postoperative.      Major preliminary findings including presence of extensive pulmonary emboli with right heart strain was conveyed to Dr. Iannazzo by Dairl messenger at 4:34 PM on 08/25/2024.          Finalized by Norleen Pane, M.D. on 08/25/2024 4:35 PM. Dictated by Norleen Pane, M.D. on 08/25/2024 4:12 PM.      2D + DOPPLER ECHO   Final Result      CHEST SINGLE VIEW   Final Result   FINDINGS/IMPRESSION:        Support Devices: Endotracheal tube has been slightly advanced, with the tip now projecting 3 cm above the carina. This could be slightly advanced for optimal positioning.      Lungs/Pleura: Inferior aspects of both lungs, particularly the left, are excluded from the field-of-view,. Patchy bibasilar opacities, likely atelectasis. No pneumothorax.      Heart and Mediastinum: The cardiomediastinal silhouette is stable.             Finalized by JOSETTE BEAGLE, M.D. on 08/25/2024 12:48 PM. Dictated by JOSETTE BEAGLE, M.D. on 08/25/2024 12:46 PM.      CHEST SINGLE VIEW   Final Result   FINDINGS/IMPRESSION:  Support Devices: Endotracheal tube in place, with the tip projecting 0.5 cm above the carina. Advancement is recommended..      Lungs/Pleura: Inferior aspect of the left lung, and inferior right costophrenic angle are excluded from the film.SABRA Patchy bibasilar opacities, likely atelectasis. Mild pulmonary edema.. No pneumothorax.SABRA      Heart and Mediastinum: Cardiac silhouette is incompletely visualized, although likely within normal limits.          Finalized by JOSETTE BEAGLE, M.D. on 08/25/2024 12:49 PM. Dictated by JOSETTE BEAGLE, M.D. on 08/25/2024 12:48 PM.      US  ABDOMEN LIMITED    (Results Pending)        Scheduled Meds:allopurinoL  (ZYLOPRIM ) tablet 100 mg, 100 mg, Oral, QDAY  apixaban  (ELIQUIS ) tablet 5 mg, 5 mg, Oral, BID  [Held by Provider] atorvastatin  (LIPITOR) tablet 10 mg, 10 mg, Per NG tube, QDAY  bacitracin  zinc  topical ointment, , Topical, BID  [Held by Provider] lisinopriL  (ZESTRIL ) tablet 10 mg, 10 mg, Oral, QDAY  melatonin (MELATIN) tablet 6 mg, 6 mg, Oral, QHS  pantoprazole  DR (PROTONIX ) tablet 40 mg, 40 mg, Oral, QDAY  polyethylene glycol 3350  (MIRALAX ) packet 17 g, 1 packet, Feeding Tube, QDAY  predniSONE  (DELTASONE ) tablet 40 mg, 40 mg, Oral, QDAY  sennosides-docusate sodium  (SENOKOT-S) tablet 1 tablet, 1 tablet, Per OG Tube, QDAY  tamsulosin  (FLOMAX ) capsule 0.4 mg, 0.4 mg, Oral, QDAY after breakfast    Continuous Infusions:   sodium chloride  0.9% TKO infusion 5 mL/hr at 08/31/24 0528     PRN and Respiratory Meds:[Held by Provider] acetaminophen  Q6H PRN, albuterol -ipratropium Q4H PRN, dextrose  50% PRN, pancrelipase  20,880 Units/sodium bicarbonate  650 mg ([place] CLOG DESTROYER) PRN (On Call from Rx), sodium chloride  BID PRN      Malnutrition Details:              Loss of Subcutaneous Fat: No      Muscle Wasting: No                  Active Wounds                                                          [1]   Social History  Socioeconomic History    Marital status: Divorced   Tobacco Use    Smoking status: Never    Smokeless tobacco: Former     Types: Chew     Quit date: 1990    Tobacco comments:     Chewed x10 years   Vaping Use    Vaping status: Never Used   Substance and Sexual Activity    Alcohol use: Yes     Alcohol/week: 2.0 standard drinks of alcohol     Types: 2 Cans of beer per week     Comment: Occasional    Drug use: Never   [2]   Current Facility-Administered Medications:     [Held by Provider] acetaminophen  (TYLENOL ) tablet 650 mg, 650 mg, Oral, Q6H PRN, Iannazzo, Emily G, DO, 650 mg at 09/03/24 2150    albuterol -ipratropium (DUONEB) nebulizer solution 3 mL, 3 mL, Inhalation, Q4H PRN, Iannazzo, Emily G, DO, 3 mL at 08/29/24 1307    allopurinoL  (ZYLOPRIM ) tablet 100 mg, 100 mg, Oral, QDAY, Dozier, Darian, DO, 100 mg at 09/05/24 0803    [COMPLETED] apixaban  (ELIQUIS ) tablet 10 mg,  10 mg, Oral, BID, 10 mg at 09/01/24 2015 **FOLLOWED BY** apixaban  (ELIQUIS ) tablet 5 mg, 5 mg, Oral, BID, Iannazzo, Emily G, DO, 5 mg at 09/05/24 0803    [Held by Provider] atorvastatin  (LIPITOR) tablet 10 mg, 10 mg, Per NG tube, QDAY, Iannazzo, Emily G, DO, 10 mg at 09/04/24 9143    bacitracin  zinc  topical ointment, , Topical, BID, Marty Toribio SAUNDERS, MD, Given at 09/05/24 9188    dextrose  50% (D50) syringe 25-50 mL, 12.5-25 g, Intravenous, PRN, Dodd, Danica, MD    [facility] by Provider] lisinopriL  (ZESTRIL ) tablet 10 mg, 10 mg, Oral, QDAY, Iannazzo, Emily G, DO, 10 mg at 09/03/24 0912    melatonin (MELATIN) tablet 6 mg, 6 mg, Oral, QHS, Iannazzo, Emily G, DO, 6 mg at 09/04/24 2105    pancrelipase  20,880 Units/sodium bicarbonate  650 mg ([place] CLOG DESTROYER), , Feeding Tube, PRN (On Call from Rx), Janine Dukes, MD    pantoprazole  DR (PROTONIX ) tablet 40 mg, 40 mg, Oral, QDAY, Provider, Pharmacy, 40 mg at 09/05/24 0803    polyethylene glycol 3350  (MIRALAX ) packet 17 g, 1 packet, Feeding Tube, QDAY, Iannazzo, Emily G, DO, 17 g at 08/31/24 0800    predniSONE  (DELTASONE ) tablet 40 mg, 40 mg, Oral, QDAY, Dozier, Darian, DO, 40 mg at 09/05/24 0803    sennosides-docusate sodium  (SENOKOT-S) tablet 1 tablet, 1 tablet, Per OG Tube, QDAY, Iannazzo, Emily G, DO, 1 tablet at 08/31/24 0800    sodium chloride  (NEBUSAL) 3 % nebulizer solution 4 mL, 4 mL, Inhalation, BID PRN, Antonetta Elspeth RODES, MD    sodium chloride  0.9% TKO infusion, , Intravenous, Continuous, Jobe, Alan CROME, MD, Last Rate: 5 mL/hr at 08/31/24 0528, New Bag at 08/31/24 9471    tamsulosin  (FLOMAX ) capsule 0.4 mg, 0.4 mg, Oral, QDAY after breakfast, Nyle Tinnie SAILOR, MD, 0.4 mg at 09/05/24 [phone]

## 2024-09-06 ENCOUNTER — Encounter: Admit: 2024-09-06 | Discharge: 2024-09-06 | Payer: BLUE CROSS/BLUE SHIELD

## 2024-09-06 LAB — BASIC METABOLIC PANEL
~~LOC~~ BKR ANION GAP: 11 (ref 3–12)
~~LOC~~ BKR BLD UREA NITROGEN: 53 mg/dL — ABNORMAL HIGH (ref 7–25)
~~LOC~~ BKR CALCIUM: 9.3 mg/dL (ref 8.5–10.6)
~~LOC~~ BKR CHLORIDE: 107 mmol/L (ref 98–110)
~~LOC~~ BKR CO2: 17 mmol/L — ABNORMAL LOW (ref 21–30)
~~LOC~~ BKR CREATININE: 2.2 mg/dL — ABNORMAL HIGH (ref 0.40–1.24)
~~LOC~~ BKR GLOMERULAR FILTRATION RATE (GFR): 33 mL/min — ABNORMAL LOW (ref >60)
~~LOC~~ BKR GLUCOSE, RANDOM: 183 mg/dL — ABNORMAL HIGH (ref 70–100)
~~LOC~~ BKR POTASSIUM: 4.5 mmol/L (ref 3.5–5.1)
~~LOC~~ BKR SODIUM, SERUM: 135 mmol/L — ABNORMAL LOW (ref 137–147)

## 2024-09-06 MED ORDER — SODIUM BICARBONATE 650 MG PO TAB
650 mg | Freq: Two times a day (BID) | ORAL | 0 refills | Status: DC
Start: 2024-09-06 — End: 2024-09-07
  Administered 2024-09-06 – 2024-09-07 (×2): 650 mg via ORAL

## 2024-09-06 NOTE — Progress Notes [1]
 MEDICAL INTENSIVE CARE UNIT  PROGRESS NOTE       Patient's Name:  Matthew Paul MRN: [id number]   Patient's DOB: May 25, 1966   Today's Date:  09/06/2024  Admission Date: 08/24/2024  LOS: 12 days    Problem list  Principal Problem:    Acquired bilateral renal cysts  Active Problems:    ADPKD (autosomal dominant polycystic kidney disease)    Cardiac arrest (CMS-HCC)    Acute kidney injury superimposed on CKD    Obstructive cardiovascular shock (CMS-HCC)    Lactic acidosis    High anion gap metabolic acidosis    Acute pulmonary embolism with acute cor pulmonale (CMS-HCC)    Acute respiratory failure with hypoxia (CMS-HCC)      HOSPITAL COURSE SUMMARY     Hearl Heikes is a 58 y.o. 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  Patient admitted initially on 11/14 for planned left robotic renal cyst decortication and cystoscopy with ureteral stent placement.  No complications during procedure and patient had existing JP drain placed.  Unfortunately, patient suffered PEA cardiac arrest on 11/15 with ROSC following 2 rounds of epinephrine .    Patient was transported to the ICU on 11/15 and initially required high vent settings [PEEP 10, FiO2 100%] and pressor support with NE and vaso.  Cardiology consulted for abnormal EKG with new RBBB.  Mild metabolic derangements on initial labs without electrolyte disturbances.  Formal TTE stat with EF 55%, grade 1 diastolic dysfunction, and moderate to severely dilated LV with PASP 34.  CTA chest 11/15 showing massive bilateral PE, right greater than left with significant RV strain.  CT AP without evidence of hemorrhage or acute abdominal pathology.  After much discussion with family regarding risk/benefit, started on therapeutic heparin  drip 11/15. The patient continued to be intubated and sedated due to high ventilation requirements over the next several days. Antibiotics were begun on 11/17 given fever and possible pneumonia on CXR. Switched from heparin  gtt to eliquis  on 11/20.  He was extubated on 11/21 and no longer required pressors. Downgraded to floor status on 11/22.     Interval update 09/06/2024:  > Continued gout pain in bilateral ankles and feet,continuing prednisone  40 mg qday  x5 days  >Cr is currently at historical baseline, no acute concerns at this time.  >Sodium bicarbonate  650 mg BID started  >worsening transaminitis, cholestatic pattern. Known many liver cysts. Pt asymptomatic. Will CTM. Holding acetaminophen  & atorvastatin .  >Liver US  ordered --> consistent with his known cystic liver disease  > Accepted for [name] IPR, insurance approved, bed on Friday  ASSESSMENT & PLAN     NEURO/PSYCH  #Delirium  - Initially post-code: moving all extremities, opening eyes, attempting to pull at tube prior to sedation   - A+O x4 post-extubation, no focal deficits  PLAN:  > Intermittent confusion overnight, oriented on exam this morning  > Delirium precautions    -----------------------------------------------------------------------------------------------------------------------------------------------------------------------------------------------------------------------  PULMONARY  #Mechanical intubation for airway protection s/p extubation 11/21  #Bilateral massive PE  - Intubated on 11/15 post PEA arrest. ETT advanced to 28 at the teeth   - Bedside POCUS concerning for RV overload and McConnell's sign.   - TTE: RV overload further c/f PE  - CTA Chest: massive multiple BL PE, R>L. Elevated RV:LV c/w right heart strain w/ dilation of RA, RV, main PA  - Result of  long discussion with family, pharmacy, interventional radiology, regarding overall multidisciplinary approach and decision for tPA versus heparin  was to initiate heparin .  Discussed risks/benefit with family at length especially given his recent surgical procedure yesterday and existing JP drain, along with potential for existing cerebral aneurysms that are often associated with autosomal dominant PKD.  They understand this and would like to continue forward with heparin  GGT.    PLAN:  > Continue pulmonary hygiene: flutter valve, duo nebs, hypertonic saline   > eliquis  10 mg BID through 11/24, now 5 mg BID     #Suspected left lower lobe pneumonia  - MRSA nares +  - 11/17 CXR: Increasing left lower lobe consolidation which could be infection, aspiration, or worsening atelectasis.   - s/p 4 day course IV steroids  -vancomycin  (MRSA nares +) and zosyn  (11/17-11/24) to treat pneumonia empirically due to fever and potential pneumonia on CXR  PLAN:  > Incentive spirometry, duonebs, and sodium chloride  3% nebulizers for airway clearance  -----------------------------------------------------------------------------------------------------------------------------------------------------------------------------------------------------------------------  CARDIOVASCULAR  #PEA arrest   #Shock - obstructive 2/2 PE , resolved  #Remote history of Afib (1980s) not on AC   #Volume Overload  - PEA arrest on 11/15, ROSC after 2 rounds of epi and total code lasting 4-6 minutes.  - PTA: ASA 81 mg daily  - EKG:               Previous OSH 2020: SR, Qtc 406               EKG (11/15 post code): Sinus tachy 100s, Qtc 452, new RBBB  - Pressors: NE, vaso   - Echo (11/15): EF 55%. No dia dysfunction. Mod to severely enlarged RV w/ intraventricular septal flattening. PASP 35 + CVP. No major valvular abnormalities.   - CTA Chest: massive bl PE with RV strain.   - CT AP: normal post-surgical changes.   - Trop: 138   PLAN:  > BP stable     #HLD   - PTA: atorvastatin  20 mg   - Lipid profile: chol 124, TG 208, HDL 33, LDL 82  PLAN:  > holding atorvastatin      #HTN  - PTA meds: HCTZ 25 mg qAM, lisinopril  20 mg daily  PLAN:  > Holding lisinopril , likely restart on discharge  > HOLD HCTZ  due to hypotension    -----------------------------------------------------------------------------------------------------------------------------------------------------------------------------------------------------------------------  FEN/GI  #Elevated LFTs  #GERD  #Constipation, resolved  - Post code LFTs AST 157 (13 on admit), ALT 163 (12 on admit). Tbili 0.4.  - PTA meds: omeprazole 40 mg daily   - GGTP 605  PLAN:  >Elevated LFTs initially resolved 11/17 and began to elevate again 11/22. Cholestatic injury pattern. Known numerous liver cysts. Ordered Liver US -> consistent with known cysts.  > Daily CMP   > SLP evaluated: advanced diet to regular, thin liquids 11/24  > Continue PPI daily  > Bowel regimen PRN, Imodium  PRN due to frequent stools post extubation    -----------------------------------------------------------------------------------------------------------------------------------------------------------------------------------------------------------------------  RENAL  #ADPKD  #AKI on CKD   #HAGMA, mild likely 2/2 above further renal dysfunction  - PTA meds: jynarque  (valptan) 60 mg qAM / 30 mg qPM  - Baseline Cr 2.5 - 2.8   -11/14 underwent left robotic renal cyst decortication, cystoscopy, L ureteral stent placement for enlarging bilateral renal size causing pressure and discomfort in the setting of ADPKD.  Total of roughly 100 cysts were decorticated with 19 large cysts sent for pathology.  Placement of left ureteral stent, confirmation prior  to surgical completion that left ureter had no urine leak.  No overt post surgical complications.  - CT AP (11/15): normal post-operative changes  -JP drain removed by urology 11/23  -11/26 FENa 1.7  Recent Labs     09/05/24  0407 09/05/24  1616 09/06/24  0405   CR 2.42* 2.21* 2.34*   BUN 53* 53* 51*   GFR 30* 33* 31*     - Net IO Since Admission: -7,223.82 mL [09/06/24 1134]  -   Intake/Output Summary (Last 24 hours) at 09/06/2024 1134  Last data filed at 09/06/2024 0400  Gross per 24 hour   Intake --   Output 3675 ml   Net -3675 ml   PLAN:  > 11/23 urology started flomax  and recommend maintain foley placement today and trial removal when he is more mobile in the coming days with PT/OT   > Continue strict I's and O's, avoid further nephrotoxic agents, renally dose medications as able.  > Likely ATN post diuresis, monitoring I/O carefully. Replacing fluids as per interval update.  > Starting Sodium bicarbonate  650 mg BID     -----------------------------------------------------------------------------------------------------------------------------------------------------------------------------------------------------------------------  ENDOCRINE  #No acute concerns   PLAN:  > CTM    -----------------------------------------------------------------------------------------------------------------------------------------------------------------------------------------------------------------------  HEME/ONC  #DVT/PE  #Normocytic Anemia  #Thrombocytopenia  - 11/16 doppler BLE: acute appearing nonocclusive thrombus in L popliteal vein  Recent Labs     09/04/24  0406 09/05/24  0407 09/06/24  0405   HGB 10.1* 9.6* 10.0*   MCV 94.1 93.5 94.5   PLTCT 252 282 354   PLAN:  > Monitor HGB, Transfuse if Hgb <7 and PLT < 10   > Eliquis  10 mg BID through 11/24, now 5 mg BID   > No evidence of acute bleeding. Ongoing monitor for s/s of bleeding.     #Leukocytosis, likely reactive, resolved  - Initial leukocytosis downtrended, consistent with reactive leukocytosis due to surgery/cardiac arrest, until elevation on 11/18  PLAN:   > Likely due to initiation of steroids, last dose on 11/20, continue to monitor with daily CBC   -Further steroids starting 11/25 for gout  -----------------------------------------------------------------------------------------------------------------------------------------------------------------------------------------------------------------------  ID  #Febrile  #Suspected Left Lower Lobe Pneumonia  - Last febrile temperature at 2000 on 11/17  - MRSA nares +  - 11/17 CXR: Increasing left lower lobe consolidation which could be infection, aspiration, or worsening atelectasis.   - s/p 4 day course IV steroids   Recent Labs     09/04/24  0406 09/05/24  0407 09/06/24  0405   WBC 12.90* 14.00* 13.50*     - Temp (24hrs), Avg:36.9 ?C (98.4 ?F), Min:36.7 ?C (98.1 ?F), Max:37 ?C (98.6 ?F)      Culture Data Date collected Result   Blood cx 11/17 NG x 5d   Sputum cx 11/17 Normal oropharyngeal flora   Urine cx 11/17 NG x 24h   Blood cx 11/20 NG 3d     Antimicrobial First dose Last dose Comment   Ancef  11/14 11/14 X2, for perioperative abx   Vancomycin  11/17 11/24    Zosyn  11/17 11/24      PLAN:  > Follow cultures as above  > Tylenol  PRN for fever -> held per transaminitis    ----------------------------------------------------------------------------------------------------------------------------------------------------------------------------------------------------------------------  MSK/DERM  #Gout   PLAN:  > allopurinol  100 mg qday   > prednisone  40 mg qday  x5 days for increasing gout pain in bilateral ankles and feet    -----------------------------------------------------------------------------------------------------------------------------------------------------------------------------------------------------------------------  LDA/PROPHYLAXIS  Patient Lines/Drains/Airways Status  Active Lines:       Name Placement date Placement time Site Days    Indwelling Urinary Catheter 16 FR Standard 2-way 09/01/24  1730  -- 5    Peripheral IV 08/31/24 1342 Right Mid Upper Arm 20 G 08/31/24  1342  -- 6    Peripheral IV 08/31/24 1452 Left Mid Forearm 20 G 08/31/24  1452  -- 6                    Lines: PIV x2  Tubes: None  Urinary Catheter:  Yes  VTE ppx: SCDs, eliquis   GI:  PPI  Bowel regimen: miralax  and senna  Diet: Regular Diet  DIET REGULAR  Code status: Full Code     Patient discussed with Elspeth ELINORE Pesa, MD     Alois Hamlet, MD  Internal Medicine PGY-1   Available on Voalte      SUBJECTIVE     No acute events overnight. Feels his bilateral ankles and feet's gout pain is the same but feels he continues to improve overall.  No abdominal pain.   Past Medical History:    Acid reflux    ADPKD (autosomal dominant polycystic kidney)    Apnea    Gout    Hyperlipidemia    Hypertension    Mild shortness of breath    Paroxysmal A-fib (CMS-HCC)     Surgical History:   Procedure Laterality Date    HX HERNIA REPAIR Right 1983    ROBOTIC LAPAROSCOPIC ABLATION RENAL CYSTS Right 06/08/2019    Performed by Erskin Lenis, MD at [facility] OR    ROBOT ASSISTED SURGERY N/A 06/08/2019    Performed by Erskin Lenis, MD at [facility] OR    CYSTOURETHROSCOPY WITH INDWELLING URETERAL STENT INSERTION Right 06/08/2019    Performed by Erskin Lenis, MD at [facility] OR    ROBOTIC ASSISTED LAPAROSCOPIC RENAL CYST DECORTICATION Left 10/02/2019    Performed by Erskin Lenis, MD at [facility] OR    CYSTOURETHROSCOPY WITH INDWELLING URETERAL STENT INSERTION Left 10/02/2019    Performed by Erskin Lenis, MD at [facility] OR    ABLATION, CYST, KIDNEY, LAPAROSCOPIC Left 08/24/2024    Performed by Erskin Lenis LABOR, MD at [facility] OR    ROBOT ASSISTED SURGERY Left 08/24/2024    Performed by Erskin Lenis LABOR, MD at [facility] OR    CYSTOURETHROSCOPY, WITH INDWELLING URETERAL STENT INSERTION Left 08/24/2024    Performed by Erskin Lenis LABOR, MD at [facility] OR    HX TONSILLECTOMY      KIDNEY CYST REMOVAL      Multiple    KNEE CARTILAGE SURGERY Bilateral      No family history on file.  Social History[1]  Vaping/E-liquid Use    Vaping Use Never User      Vaping/E-liquid Substances    CBD No     Nicotine No     Other No     Flavored No THC No     Unknown No             Current Medications[2]       OBJECTIVE                          Vital Signs: Last Filed                 Vital Signs: 24 Hour Range   BP: 133/82 (11/27 0823)  Temp: 37 ?C (98.6 ?F) (11/27 9176)  Pulse: 89 (11/27  0400)  Respirations: 17 PER MINUTE (11/27 0400)  SpO2: 93 % (11/27 0400)  O2 Device: None (Room air) (11/27 0400)  O2 Liter Flow: 2 Lpm (11/26 1200) BP: (121-138)/(77-82)   Temp:  [36.7 ?C (98.1 ?F)-37 ?C (98.6 ?F)]   Pulse:  [82-97]   Respirations:  [17 PER MINUTE-22 PER MINUTE]   SpO2:  [92 %-95 %]   O2 Device: None (Room air)  O2 Liter Flow: 2 Lpm   Intensity Pain Scale (Self Report): 8 (09/06/24 9076) Vitals:    08/26/24 1000 08/29/24 0400 08/31/24 1718   Weight: (!) 144.2 kg (317 lb 14.5 oz) (!) 142.1 kg (313 lb 4.4 oz) 135.2 kg (298 lb 1.6 oz)         Artificial Airway   None       Ventilator/Respiratory Therapy  None    Vent Weaning   Not applicable    Physical Exam  Constitutional: 58 y.o. male  A + O x4  Head: Normocephalic, atraumatic  Cardiovascular: Regular rhythm, tachycardic 90-100s rate  Pulmonary: Clear to auscultation bilaterally, no wheezes or rales. Breathing well on RA  GI: Abdomen soft, non-tender, non-distended (baseline habitus), bowel sounds +. Negative Murphy's sign.  Skin: No rashes or bruises, good turgor, cap refill <2s  Neuro:  Moves all extremities, no focal deficit  Musculoskeletal: No deformities  Lymphatic/extremities: Bilateral ankles and feet appear slightly edematous. No erythema but increased calor at ankles.    Laboratory:  Recent Labs     09/04/24  0406 09/04/24  2114 09/05/24  0407 09/05/24  1616 09/06/24  0405   NA 136* 135* 137 134* 136*   K 3.9 5.6* 4.2 4.4 5.1   CL 108 108 106 107 108   CO2 16* 17* 19* 17* 17*   GAP 12 10 12 10 11    BUN 47* 48* 53* 53* 51*   CR 2.15* 2.07* 2.42* 2.21* 2.34*   GLU 96 106* 121* 147* 107*   CA 9.3 8.8 9.4 9.1 9.3   ALBUMIN  3.1*  --  2.9*  --  2.8*   MG 1.4*  --  2.1  --  1.8       Recent Labs 09/04/24  0406 09/05/24  0407 09/06/24  0405   WBC 12.90* 14.00* 13.50*   HGB 10.1* 9.6* 10.0*   HCT 30.4* 29.0* 30.5*   PLTCT 252 282 354   AST 51* 97* 201*   ALT 59* 105* 183*   ALKPHOS 206* 291* 278*      Estimated Creatinine Clearance: 53 mL/min (A) (by C-G formula based on SCr of 2.34 mg/dL (H)).  Vitals:    08/26/24 1000 08/29/24 0400 08/31/24 1718   Weight: (!) 144.2 kg (317 lb 14.5 oz) (!) 142.1 kg (313 lb 4.4 oz) 135.2 kg (298 lb 1.6 oz)      No results for input(s): PHART, PO2ART in the last 72 hours.    Invalid input(s): PC02A        Pertinent radiology reviewed.    US  ABDOMEN LIMITED   Final Result         Continued findings of polycystic liver disease.      No evidence of biliary disease      Mild splenomegaly             Finalized by Maurie Houston, M.D. on 09/05/2024 4:01 PM. Dictated by Maurie Houston, M.D. on 09/05/2024 3:53 PM.      CHEST SINGLE VIEW   Final Result  Stable support devices.      Persistent small left pleural effusion with slight improved though unresolved left lower lobe consolidation.      Stable cardiomediastinal silhouette.          Finalized by Eleanor Don, M.D. on 08/29/2024 8:28 AM. Dictated by Eleanor Don, M.D. on 08/29/2024 8:27 AM.      FEEDING TUBE PLCMNT (ABD/CHEST LMTD)   Final Result   FINDINGS/IMPRESSION:      Gastric tube courses below the diaphragm with tip projecting over the gastric antrum.      Moderate gaseous distention of the stomach. Visualized bowel gas pattern nonobstructive.       Similar left lower lobe consolidation.      By my electronic signature, I attest that I have personally reviewed the images for this examination and formulated the interpretations and opinions expressed in this report          Finalized by Omar Pinal, D.O. on 08/28/2024 10:20 AM. Dictated by Ozell Mall, DO on 08/28/2024 9:17 AM.      CHEST SINGLE VIEW   Final Result         Endotracheal tube with the tip approximately 8 cm above the carina. This could be slightly advanced for more optimal positioning.      Gastric tube courses below the diaphragm and out of the field-of-view.      Increasing left lower lobe consolidation which could be infection, aspiration, or worsening atelectasis.          Finalized by Eleanor Don, M.D. on 08/27/2024 12:25 PM. Dictated by Eleanor Don, M.D. on 08/27/2024 12:23 PM.      FEEDING TUBE PLCMNT (ABD/CHEST LMTD)   Final Result         Gastric tube with sidehole projecting over the body of the stomach and tip projecting at the gastric antrum.       The stomach is mildly distended.       Patchy bibasilar opacities are better demonstrated on recent CT chest.      By my electronic signature, I attest that I have personally reviewed the images for this examination and formulated the interpretations and opinions expressed in this report          Finalized by Christobal SHAUNNA Sawyer, MD on 08/26/2024 1:35 PM. Dictated by Mont Lay, MD on 08/26/2024 1:28 PM.      US  VENOUS DOPPLER BILATERAL   Final Result         1.  Acute appearing nonocclusive thrombus in the left popliteal vein.      2.  No femoral/popliteal deep venous thrombosis in right lower extremity.      By my electronic signature, I attest that I have personally reviewed the images for this examination and formulated the interpretations and opinions expressed in this report          Finalized by Allean Pouch, M.D. on 08/26/2024 11:24 AM. Dictated by Mont Lay, MD on 08/26/2024 11:14 AM.      US  EXTREMITY LIMITED RIGHT   Final Result         1.  Acute appearing nonocclusive thrombus in the left popliteal vein.      2.  No femoral/popliteal deep venous thrombosis in right lower extremity.      By my electronic signature, I attest that I have personally reviewed the images for this examination and formulated the interpretations and opinions expressed in this report          Finalized by  Allean Pouch, M.D. on 08/26/2024 11:24 AM. Dictated by Mont Lay, MD on 08/26/2024 11:14 AM.      CTA CHEST PULM EMBOLISM W/CONT   Final Result         CTA CHEST:      1. Multiple bilateral pulmonary emboli as described above, right greater than left. Elevated RV: LV ratio consistent with right heart strain, with dilatation of the right atrium, right ventricle and main pulmonary artery. Borderline cardiomegaly.   2. Moderate left lower lobe with additional areas of mild bilateral atelectasis, without significant pulmonary infarct or pleural effusion.      CT abdomen and pelvis:      1. Postsurgical changes of reported recent left robotic renal cyst decortication, with left perinephric stranding and multifocal gas. Small amount of free air is noted and likely postoperative. Surgical drain extending to the left lateral perinephric region, with no abdominal or pelvic fluid collection.   2. Redemonstration of marked bilateral renal enlargement with innumerable cysts, consistent with autosomal dominant polycystic kidney disease. 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   3. Bladder is decompressed about a Foley catheter, limiting evaluation.   4. Colonic diverticulosis without acute diverticulitis or bowel obstruction.   5. Trace pelvic free fluid, nonspecific though possibly postoperative.      Major preliminary findings including presence of extensive pulmonary emboli with right heart strain was conveyed to Dr. Iannazzo by Dairl messenger at 4:34 PM on 08/25/2024.          Finalized by Norleen Pane, M.D. on 08/25/2024 4:35 PM. Dictated by Norleen Pane, M.D. on 08/25/2024 4:12 PM.      CT ABD/PELV W CONTRAST   Final Result         CTA CHEST:      1. Multiple bilateral pulmonary emboli as described above, right greater than left. Elevated RV: LV ratio consistent with right heart strain, with dilatation of the right atrium, right ventricle and main pulmonary artery. Borderline cardiomegaly.   2. Moderate left lower lobe with additional areas of mild bilateral atelectasis, without significant pulmonary infarct or pleural effusion.      CT abdomen and pelvis:      1. Postsurgical changes of reported recent left robotic renal cyst decortication, with left perinephric stranding and multifocal gas. Small amount of free air is noted and likely postoperative. Surgical drain extending to the left lateral perinephric region, with no abdominal or pelvic fluid collection.   2. Redemonstration of marked bilateral renal enlargement with innumerable cysts, consistent with autosomal dominant polycystic kidney disease. 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   3. Bladder is decompressed about a Foley catheter, limiting evaluation.   4. Colonic diverticulosis without acute diverticulitis or bowel obstruction.   5. Trace pelvic free fluid, nonspecific though possibly postoperative.      Major preliminary findings including presence of extensive pulmonary emboli with right heart strain was conveyed to Dr. Iannazzo by Dairl messenger at 4:34 PM on 08/25/2024.          Finalized by Norleen Pane, M.D. on 08/25/2024 4:35 PM. Dictated by Norleen Pane, M.D. on 08/25/2024 4:12 PM.      2D + DOPPLER ECHO   Final Result      CHEST SINGLE VIEW   Final Result   FINDINGS/IMPRESSION:        Support Devices: Endotracheal tube has been slightly advanced, with the tip now projecting 3 cm above the carina.  This could be slightly advanced for optimal positioning.      Lungs/Pleura: Inferior aspects of both lungs, particularly the left, are excluded from the field-of-view,. Patchy bibasilar opacities, likely atelectasis. No pneumothorax.      Heart and Mediastinum: The cardiomediastinal silhouette is stable.             Finalized by JOSETTE BEAGLE, M.D. on 08/25/2024 12:48 PM. Dictated by JOSETTE BEAGLE, M.D. on 08/25/2024 12:46 PM.      CHEST SINGLE VIEW   Final Result   FINDINGS/IMPRESSION: Support Devices: Endotracheal tube in place, with the tip projecting 0.5 cm above the carina. Advancement is recommended..      Lungs/Pleura: Inferior aspect of the left lung, and inferior right costophrenic angle are excluded from the film.SABRA Patchy bibasilar opacities, likely atelectasis. Mild pulmonary edema.. No pneumothorax.SABRA      Heart and Mediastinum: Cardiac silhouette is incompletely visualized, although likely within normal limits.          Finalized by JOSETTE BEAGLE, M.D. on 08/25/2024 12:49 PM. Dictated by JOSETTE BEAGLE, M.D. on 08/25/2024 12:48 PM.           Scheduled Meds:allopurinoL  (ZYLOPRIM ) tablet 100 mg, 100 mg, Oral, QDAY  apixaban  (ELIQUIS ) tablet 5 mg, 5 mg, Oral, BID  [Held by Provider] atorvastatin  (LIPITOR) tablet 10 mg, 10 mg, Per NG tube, QDAY  bacitracin  zinc  topical ointment, , Topical, BID  [Held by Provider] lisinopriL  (ZESTRIL ) tablet 10 mg, 10 mg, Oral, QDAY  melatonin (MELATIN) tablet 6 mg, 6 mg, Oral, QHS  pantoprazole  DR (PROTONIX ) tablet 40 mg, 40 mg, Oral, QDAY  polyethylene glycol 3350  (MIRALAX ) packet 17 g, 1 packet, Feeding Tube, QDAY  predniSONE  (DELTASONE ) tablet 40 mg, 40 mg, Oral, QDAY  sennosides-docusate sodium  (SENOKOT-S) tablet 1 tablet, 1 tablet, Per OG Tube, QDAY  sodium bicarbonate  tablet 650 mg, 650 mg, Oral, BID  tamsulosin  (FLOMAX ) capsule 0.4 mg, 0.4 mg, Oral, QDAY after breakfast    Continuous Infusions:   sodium chloride  0.9% TKO infusion 5 mL/hr at 08/31/24 0528     PRN and Respiratory Meds:[Held by Provider] acetaminophen  Q6H PRN, albuterol -ipratropium Q4H PRN, dextrose  50% PRN, pancrelipase  20,880 Units/sodium bicarbonate  650 mg ([place] CLOG DESTROYER) PRN (On Call from Rx), sodium chloride  BID PRN      Malnutrition Details:              Loss of Subcutaneous Fat: No      Muscle Wasting: No                  Active Wounds                                                            [1]   Social History  Socioeconomic History    Marital status: Divorced   Tobacco Use    Smoking status: Never    Smokeless tobacco: Former     Types: Chew     Quit date: 1990    Tobacco comments:     Chewed x10 years   Vaping Use    Vaping status: Never Used   Substance and Sexual Activity    Alcohol use: Yes     Alcohol/week: 2.0 standard drinks of alcohol     Types: 2 Cans of beer per week  Comment: Occasional    Drug use: Never   [2]   Current Facility-Administered Medications:     [Held by Provider] acetaminophen  (TYLENOL ) tablet 650 mg, 650 mg, Oral, Q6H PRN, Iannazzo, Emily G, DO, 650 mg at 09/03/24 2150    albuterol -ipratropium (DUONEB) nebulizer solution 3 mL, 3 mL, Inhalation, Q4H PRN, Iannazzo, Emily G, DO, 3 mL at 08/29/24 1307    allopurinoL  (ZYLOPRIM ) tablet 100 mg, 100 mg, Oral, QDAY, Dozier, Darian, DO, 100 mg at 09/06/24 0925    [COMPLETED] apixaban  (ELIQUIS ) tablet 10 mg, 10 mg, Oral, BID, 10 mg at 09/01/24 2015 **FOLLOWED BY** apixaban  (ELIQUIS ) tablet 5 mg, 5 mg, Oral, BID, Iannazzo, Emily G, DO, 5 mg at 09/06/24 9074    [Held by Provider] atorvastatin  (LIPITOR) tablet 10 mg, 10 mg, Per NG tube, QDAY, Iannazzo, Emily G, DO, 10 mg at 09/04/24 9143    bacitracin  zinc  topical ointment, , Topical, BID, Marty Toribio SAUNDERS, MD, Given at 09/05/24 2039    dextrose  50% (D50) syringe 25-50 mL, 12.5-25 g, Intravenous, PRN, Dodd, Danica, MD    [facility] by Provider] lisinopriL  (ZESTRIL ) tablet 10 mg, 10 mg, Oral, QDAY, Iannazzo, Emily G, DO, 10 mg at 09/03/24 0912    melatonin (MELATIN) tablet 6 mg, 6 mg, Oral, QHS, Iannazzo, Emily G, DO, 6 mg at 09/05/24 2039    pancrelipase  20,880 Units/sodium bicarbonate  650 mg ([place] CLOG DESTROYER), , Feeding Tube, PRN (On Call from Rx), Janine Dukes, MD    pantoprazole  DR (PROTONIX ) tablet 40 mg, 40 mg, Oral, QDAY, Provider, Pharmacy, 40 mg at 09/06/24 9074    polyethylene glycol 3350  (MIRALAX ) packet 17 g, 1 packet, Feeding Tube, QDAY, Iannazzo, Emily G, DO, 17 g at 08/31/24 0800    predniSONE  (DELTASONE ) tablet 40 mg, 40 mg, Oral, QDAY, Dozier, Darian, DO, 40 mg at 09/06/24 0925    sennosides-docusate sodium  (SENOKOT-S) tablet 1 tablet, 1 tablet, Per OG Tube, QDAY, Iannazzo, Emily G, DO, 1 tablet at 08/31/24 0800    sodium bicarbonate  tablet 650 mg, 650 mg, Oral, BID, Agapito Ned, DO, 650 mg at 09/06/24 0925    sodium chloride  (NEBUSAL) 3 % nebulizer solution 4 mL, 4 mL, Inhalation, BID PRN, Antonetta Elspeth RODES, MD    sodium chloride  0.9% TKO infusion, , Intravenous, Continuous, Jobe, Alan CROME, MD, Last Rate: 5 mL/hr at 08/31/24 0528, New Bag at 08/31/24 0528    tamsulosin  (FLOMAX ) capsule 0.4 mg, 0.4 mg, Oral, QDAY after breakfast, Nyle Tinnie SAILOR, MD, 0.4 mg at 09/06/24 [phone]

## 2024-09-06 NOTE — Case Mgmt DC Plan [600024]
 Case Management Progress Note    NAME:Matthew Paul                          MRN: [id number]              DOB:[birth year]          AGE: 58 y.o.  ADMISSION DATE: 08/24/2024             DAYS ADMITTED: LOS: 12 days      Today's Date: 09/06/2024    PLAN: Anticipate dc to [facility] on Friday.    Expected Discharge Date: 09/06/2024   Is Patient Medically Stable: Yes   Are there Barriers to Discharge? no    INTERVENTION/DISPOSITION:  Discharge Planning  Holiday SW called [facility] ph [phone] to inquire about status of insurance auth.  Auth was received late on Wednesday, but they do not have bed availability until Friday.  Friday SW will need to call ph [phone] to arrange for dc on Friday.  SW provided update to Attending/Team.  SW to update weekend report for continued follow up.    Transportation              Does the Patient Need Case Management to Arrange Discharge Transport? (ex: facility, ambulance, wheelchair/stretcher, Medicaid, cab, other): Yes  Type of Transport: Wheelchair fleeta  Will the Patient Use Family Transport?: No  Transportation Name, Phone and Availability #1: [name]  [phone]  Support              Support: Pt/Family Updates re:POC or DC Plan, Patient Education, Huddle/team update  Info or Referral                 Positive SDOH Domains and Potential Barriers                   Medication Needs                                                                  Financial                 Legal                 Other                 Discharge Disposition                                                                                                                                                      Selected Continued Care - Admitted  Since 08/24/2024       [place] Destination Coordination complete.      Service Provider Services Address Phone Fax Patient Preferred    [facility]  [facility] [address], [place]  [place] [address] [phone] --                      [phone] Jakia Kennebrew    Damian Banco, LMSW, ACM-SW  Weekend Social Work Case Manager  C: [phone]

## 2024-09-07 ENCOUNTER — Encounter: Admit: 2024-09-07 | Discharge: 2024-09-07 | Payer: BLUE CROSS/BLUE SHIELD

## 2024-09-07 LAB — HEPATITIS PANEL, ACUTE

## 2024-09-07 LAB — CBC
~~LOC~~ BKR MCH: 31 pg (ref 26.0–34.0)
~~LOC~~ BKR MCHC: 33 g/dL (ref 32.0–36.0)
~~LOC~~ BKR MCV: 94 fL — ABNORMAL LOW (ref 80.0–100.0)
~~LOC~~ BKR MPV: 8.9 fL (ref 7.0–11.0)
~~LOC~~ BKR PLATELET COUNT: 417 10*3/uL — ABNORMAL HIGH (ref 150–400)
~~LOC~~ BKR RDW: 15 % (ref 11.0–15.0)

## 2024-09-07 MED ORDER — COLCHICINE 0.6 MG PO TAB
.6 mg | Freq: Two times a day (BID) | ORAL | 0 refills | Status: CP
Start: 2024-09-07 — End: ?
  Administered 2024-09-08 – 2024-09-09 (×4): 0.6 mg via ORAL

## 2024-09-07 MED ORDER — PREDNISONE 20 MG PO TAB
40 mg | ORAL_TABLET | Freq: Every day | ORAL | 0 refills | Status: CN
Start: 2024-09-07 — End: ?

## 2024-09-07 MED ORDER — COLCHICINE 0.6 MG PO TAB
.6 mg | ORAL_TABLET | Freq: Two times a day (BID) | ORAL | 0 refills | Status: CN
Start: 2024-09-07 — End: ?

## 2024-09-07 MED ORDER — SODIUM BICARBONATE 650 MG PO TAB
1300 mg | Freq: Two times a day (BID) | ORAL | 0 refills | Status: DC
Start: 2024-09-07 — End: 2024-09-10
  Administered 2024-09-07 – 2024-09-10 (×7): 1300 mg via ORAL

## 2024-09-07 MED ORDER — COLCHICINE 0.6 MG PO TAB
1.2 mg | Freq: Once | ORAL | 0 refills | Status: CP
Start: 2024-09-07 — End: ?
  Administered 2024-09-07: 17:00:00 1.2 mg via ORAL

## 2024-09-07 NOTE — Case Mgmt DC Plan [600024]
 Case Management Progress Note    NAME:Matthew Paul                          MRN: [id number]              DOB:06-Jul-1966          AGE: 58 y.o.  ADMISSION DATE: 08/24/2024             DAYS ADMITTED: LOS: 13 days      Today's Date: 09/07/2024    PLAN: D/c to [facility]    Expected Discharge Date: 09/08/2024   Is Patient Medically Stable: No, Please explain: hepatology consult  Are there Barriers to Discharge? no    INTERVENTION/DISPOSITION:  Discharge Planning              Discharge Planning: Inpatient Rehabilitation    Pt not stable per Hepatology. No weekend beds at I[facility]. Primary SW to f/u on Monday. SW updated [facility].    Transportation              Does the Patient Need Case Management to Arrange Discharge Transport? (ex: facility, ambulance, wheelchair/stretcher, Medicaid, cab, other): Yes  Type of Transport: Wheelchair fleeta  Will the Patient Use Family Transport?: No  Transportation Name, Phone and Availability #1: [name]  [phone]  Support              Support: Pt/Family Updates re:POC or DC Plan, Patient Education, Huddle/team update  Info or Referral                 Positive SDOH Domains and Potential Barriers                   Medication Needs                                                                                                                                                         Financial                 Legal                 Other                 Discharge Disposition  Selected Continued Care - Admitted Since 08/24/2024       [place] Destination Coordination complete.      Service Provider Services Address Phone Fax Patient Preferred    [facility]  [facility]  Inpatient Rehabilitation [address], [place]  [place] [address] [phone] --                    Lauraine Gravely, LMSW  Available on Voalte  Work Phone: [phone]

## 2024-09-07 NOTE — Progress Notes [1]
 MEDICAL INTENSIVE CARE UNIT  PROGRESS NOTE       Patient's Name:  Matthew Paul MRN: [id number]   Patient's DOB: 1965/10/13   Today's Date:  09/07/2024  Admission Date: 08/24/2024  LOS: 13 days    Problem list  Principal Problem:    Acquired bilateral renal cysts  Active Problems:    ADPKD (autosomal dominant polycystic kidney disease)    Cardiac arrest (CMS-HCC)    Acute kidney injury superimposed on CKD    Obstructive cardiovascular shock (CMS-HCC)    Lactic acidosis    High anion gap metabolic acidosis    Acute pulmonary embolism with acute cor pulmonale (CMS-HCC)    Acute respiratory failure with hypoxia (CMS-HCC)      HOSPITAL COURSE SUMMARY     Matthew Paul is a 58 y.o. 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  Patient admitted initially on 11/14 for planned left robotic renal cyst decortication and cystoscopy with ureteral stent placement.  No complications during procedure and patient had existing JP drain placed.  Unfortunately, patient suffered PEA cardiac arrest on 11/15 with ROSC following 2 rounds of epinephrine .    Patient was transported to the ICU on 11/15 and initially required high vent settings [PEEP 10, FiO2 100%] and pressor support with NE and vaso.  Cardiology consulted for abnormal EKG with new RBBB.  Mild metabolic derangements on initial labs without electrolyte disturbances.  Formal TTE stat with EF 55%, grade 1 diastolic dysfunction, and moderate to severely dilated LV with PASP 34.  CTA chest 11/15 showing massive bilateral PE, right greater than left with significant RV strain.  CT AP without evidence of hemorrhage or acute abdominal pathology.  After much discussion with family regarding risk/benefit, started on therapeutic heparin  drip 11/15. The patient continued to be intubated and sedated due to high ventilation requirements over the next several days. Antibiotics were begun on 11/17 given fever and possible pneumonia on CXR. Switched from heparin  gtt to eliquis  on 11/20.  He was extubated on 11/21 and no longer required pressors. Downgraded to floor status on 11/22.     Interval update 09/07/2024:  > Continued gout pain in bilateral ankles and feet,continuing prednisone  40 mg qday  x5 days and now starting Colchicine  for 3 days as noted in A/P  >Cr is currently at historical baseline, no acute concerns at this time.  >Sodium bicarbonate  increased to 1300 mg BID  >worsening transaminitis, cholestatic pattern. Known many liver cysts. Hepatitis panel-Nonreactive. Pt asymptomatic.   ->Consulted hepatology  >Needs at least 6 months of eliquis  considering his PE  > Accepted for [facility], insurance approved, bed available today  ASSESSMENT & PLAN     NEURO/PSYCH  #Delirium  - Initially post-code: moving all extremities, opening eyes, attempting to pull at tube prior to sedation   - A+O x4 post-extubation, no focal deficits  PLAN:  > Intermittent confusion overnight, oriented on exam this morning  > Delirium precautions    -----------------------------------------------------------------------------------------------------------------------------------------------------------------------------------------------------------------------  PULMONARY  #Mechanical intubation for airway protection s/p extubation 11/21  #Bilateral massive PE  - Intubated on 11/15 post PEA arrest. ETT advanced to 28 at the teeth   - Bedside POCUS concerning for RV overload and McConnell's sign.   - TTE: RV overload further c/f PE  - CTA Chest: massive multiple BL PE, R>L. Elevated RV:LV c/w right heart strain  w/ dilation of RA, RV, main PA  - Result of long discussion with family, pharmacy, interventional radiology, regarding overall multidisciplinary approach and decision for tPA versus heparin  was to initiate heparin .  Discussed risks/benefit with family at length especially given his recent surgical procedure yesterday and existing JP drain, along with potential for existing cerebral aneurysms that are often associated with autosomal dominant PKD.  They understand this and would like to continue forward with heparin  GGT.    PLAN:  > Continue pulmonary hygiene: flutter valve, duo nebs, hypertonic saline   > eliquis  10 mg BID through 11/24, now 5 mg BID     #Suspected left lower lobe pneumonia  - MRSA nares +  - 11/17 CXR: Increasing left lower lobe consolidation which could be infection, aspiration, or worsening atelectasis.   - s/p 4 day course IV steroids  -vancomycin  (MRSA nares +) and zosyn  (11/17-11/24) to treat pneumonia empirically due to fever and potential pneumonia on CXR  PLAN:  > Incentive spirometry, duonebs, and sodium chloride  3% nebulizers for airway clearance  -----------------------------------------------------------------------------------------------------------------------------------------------------------------------------------------------------------------------  CARDIOVASCULAR  #PEA arrest   #Shock - obstructive 2/2 PE , resolved  #Remote history of Afib (1980s) not on AC   #Volume Overload  - PEA arrest on 11/15, ROSC after 2 rounds of epi and total code lasting 4-6 minutes.  - PTA: ASA 81 mg daily  - EKG:               Previous OSH 2020: SR, Qtc 406               EKG (11/15 post code): Sinus tachy 100s, Qtc 452, new RBBB  - Pressors: NE, vaso   - Echo (11/15): EF 55%. No dia dysfunction. Mod to severely enlarged RV w/ intraventricular septal flattening. PASP 35 + CVP. No major valvular abnormalities.   - CTA Chest: massive bl PE with RV strain.   - CT AP: normal post-surgical changes.   - Trop: 138   PLAN:  > BP stable     #HLD   - PTA: atorvastatin  20 mg   - Lipid profile: chol 124, TG 208, HDL 33, LDL 82  PLAN:  > holding atorvastatin      #HTN  - PTA meds: HCTZ 25 mg qAM, lisinopril  20 mg daily  PLAN:  > Holding lisinopril ,  restart on discharge  > HOLD HCTZ  due to hypotension, restart on discharge    -----------------------------------------------------------------------------------------------------------------------------------------------------------------------------------------------------------------------  FEN/GI  #Elevated LFTs  #GERD  #Constipation, resolved  - Post code LFTs AST 157 (13 on admit), ALT 163 (12 on admit). Tbili 0.4.  - PTA meds: omeprazole 40 mg daily   - GGTP 605  -11/28 Hep panel non reactive  PLAN:  >Elevated LFTs initially resolved 11/17 and began to elevate again 11/22. Cholestatic injury pattern. Known numerous liver cysts. Ordered Liver US -> consistent with known cysts.   >Hepatology consulted  > Daily CMP   > SLP evaluated: advanced diet to regular, thin liquids 11/24  > Continue PPI daily  > Bowel regimen PRN, Imodium  PRN due to frequent stools post extubation    -----------------------------------------------------------------------------------------------------------------------------------------------------------------------------------------------------------------------  RENAL  #ADPKD  #AKI on CKD   #HAGMA, mild likely 2/2 above further renal dysfunction  - PTA meds: jynarque  (valptan) 60 mg qAM / 30 mg qPM  - Baseline Cr 2.5 - 2.8   -11/14 underwent left robotic renal cyst decortication, cystoscopy, L ureteral stent placement for enlarging bilateral renal size causing pressure and discomfort in the setting of  ADPKD.  Total of roughly 100 cysts were decorticated with 19 large cysts sent for pathology.  Placement of left ureteral stent, confirmation prior to surgical completion that left ureter had no urine leak.  No overt post surgical complications.  - CT AP (11/15): normal post-operative changes  -JP drain removed by urology 11/23  -11/26 FENa 1.7  Recent Labs     09/06/24  0405 09/06/24  1616 09/07/24  0428   CR 2.34* 2.25* 2.04*   BUN 51* 53* 48*   GFR 31* 33* 37*     - Net IO Since Admission: -9,601.82 mL [09/07/24 1320]  -   Intake/Output Summary (Last 24 hours) at 09/07/2024 1320  Last data filed at 09/07/2024 1200  Gross per 24 hour   Intake 1000 ml   Output 3625 ml   Net -2625 ml   PLAN:  > 11/23 urology started flomax  and recommend maintain foley placement today and trial removal when he is more mobile in the coming days with PT/OT   > Continue strict I's and O's, avoid further nephrotoxic agents, renally dose medications as able.  > Likely ATN post diuresis, monitoring I/O carefully. Replacing fluids as per interval update.  > Increasing  Sodium bicarbonate  to 1300 mg BID     -----------------------------------------------------------------------------------------------------------------------------------------------------------------------------------------------------------------------  ENDOCRINE  #No acute concerns   PLAN:  > CTM    -----------------------------------------------------------------------------------------------------------------------------------------------------------------------------------------------------------------------  HEME/ONC  #DVT/PE  #Normocytic Anemia  #Thrombocytopenia  - 11/16 doppler BLE: acute appearing nonocclusive thrombus in L popliteal vein  Recent Labs     09/05/24  0407 09/06/24  0405 09/07/24  0428   HGB 9.6* 10.0* 10.2*   MCV 93.5 94.5 94.2   PLTCT 282 354 417*   PLAN:  > Monitor HGB, Transfuse if Hgb <7 and PLT < 10   > Eliquis  10 mg BID through 11/24, now 5 mg BID   > No evidence of acute bleeding. Ongoing monitor for s/s of bleeding.     #Leukocytosis, likely reactive, resolved  - Initial leukocytosis downtrended, consistent with reactive leukocytosis due to surgery/cardiac arrest, until elevation on 11/18  PLAN:   > Likely due to initiation of steroids, last dose on 11/20, continue to monitor with daily CBC   -Further steroids starting 11/25 for gout  -----------------------------------------------------------------------------------------------------------------------------------------------------------------------------------------------------------------------  ID  #Febrile  #Suspected Left Lower Lobe Pneumonia  - Last febrile temperature at 2000 on 11/17  - MRSA nares +  - 11/17 CXR: Increasing left lower lobe consolidation which could be infection, aspiration, or worsening atelectasis.   - s/p 4 day course IV steroids   Recent Labs     09/05/24  0407 09/06/24  0405 09/07/24  0428   WBC 14.00* 13.50* 16.90*     - Temp (24hrs), Avg:36.9 ?C (98.5 ?F), Min:36.7 ?C (98 ?F), Max:37.6 ?C (99.7 ?F)      Culture Data Date collected Result   Blood cx 11/17 NG x 5d   Sputum cx 11/17 Normal oropharyngeal flora   Urine cx 11/17 NG x 24h   Blood cx 11/20 NG 3d     Antimicrobial First dose Last dose Comment   Ancef  11/14 11/14 X2, for perioperative abx   Vancomycin  11/17 11/24    Zosyn  11/17 11/24      PLAN:  > Follow cultures as above  > Tylenol  PRN for fever -> held per transaminitis    ----------------------------------------------------------------------------------------------------------------------------------------------------------------------------------------------------------------------  MSK/DERM  #Gout   PLAN:  > allopurinol  100 mg qday   > prednisone  40 mg qday  x5 days for increasing gout pain in bilateral ankles and feet  >Starting colchicine  1.2 mg this AM followed by 0.6 mg BID to start tonight for 4 more doses (total 3 days)    -----------------------------------------------------------------------------------------------------------------------------------------------------------------------------------------------------------------------  LDA/PROPHYLAXIS  Patient Lines/Drains/Airways Status       Active Lines:       Name Placement date Placement time Site Days    Indwelling Urinary Catheter 16 FR Standard 2-way 09/01/24  1730  -- 6    Peripheral IV 08/31/24 1342 Right Mid Upper Arm 20 G 08/31/24  1342  -- 7    Peripheral IV 08/31/24 1452 Left Mid Forearm 20 G 08/31/24  1452  -- 7                    Lines: PIV x2  Tubes: None  Urinary Catheter:  Yes  VTE ppx: SCDs, eliquis   GI:  PPI  Bowel regimen: miralax  and senna  Diet: Regular Diet  DIET REGULAR  Code status: Full Code     Patient discussed with Elspeth ELINORE Pesa, MD     Alois Hamlet, MD  Internal Medicine PGY-1   Available on Voalte      SUBJECTIVE     No acute events overnight. Feels his bilateral ankles and feet's gout pain is the same, not improving with steroids.  No abdominal pain.   Past Medical History:    Acid reflux    ADPKD (autosomal dominant polycystic kidney)    Apnea    Gout    Hyperlipidemia    Hypertension    Mild shortness of breath    Paroxysmal A-fib (CMS-HCC)     Surgical History:   Procedure Laterality Date    HX HERNIA REPAIR Right 1983    ROBOTIC LAPAROSCOPIC ABLATION RENAL CYSTS Right 06/08/2019    Performed by Erskin Lenis, MD at [facility] OR    ROBOT ASSISTED SURGERY N/A 06/08/2019    Performed by Erskin Lenis, MD at [facility] OR    CYSTOURETHROSCOPY WITH INDWELLING URETERAL STENT INSERTION Right 06/08/2019    Performed by Erskin Lenis, MD at [facility] OR    ROBOTIC ASSISTED LAPAROSCOPIC RENAL CYST DECORTICATION Left 10/02/2019    Performed by Erskin Lenis, MD at [facility] OR    CYSTOURETHROSCOPY WITH INDWELLING URETERAL STENT INSERTION Left 10/02/2019    Performed by Erskin Lenis, MD at [facility] OR    ABLATION, CYST, KIDNEY, LAPAROSCOPIC Left 08/24/2024    Performed by Erskin Lenis LABOR, MD at [facility] OR    ROBOT ASSISTED SURGERY Left 08/24/2024    Performed by Erskin Lenis LABOR, MD at [facility] OR    CYSTOURETHROSCOPY, WITH INDWELLING URETERAL STENT INSERTION Left 08/24/2024    Performed by Erskin Lenis LABOR, MD at [facility] OR    HX TONSILLECTOMY      KIDNEY CYST REMOVAL      Multiple    KNEE CARTILAGE SURGERY Bilateral      No family history on file.  Social History[1]  Vaping/E-liquid Use    Vaping Use Never User Vaping/E-liquid Substances    CBD No     Nicotine No     Other No     Flavored No     THC No     Unknown No             Current Medications[2]       OBJECTIVE  Vital Signs: Last Filed                 Vital Signs: 24 Hour Range   BP: 147/81 (11/28 1200)  Temp: 37.6 ?C (99.7 ?F) (11/28 1200)  Pulse: 100 (11/28 1200)  Respirations: 19 PER MINUTE (11/28 1200)  SpO2: 97 % (11/28 0800)  O2 Device: None (Room air) (11/28 1200) BP: (131-147)/(80-83)   Temp:  [36.7 ?C (98 ?F)-37.6 ?C (99.7 ?F)]   Pulse:  [88-101]   Respirations:  [16 PER MINUTE-20 PER MINUTE]   SpO2:  [95 %-97 %]   O2 Device: None (Room air)   Intensity Pain Scale (Self Report): 3 (09/07/24 1200) Vitals:    08/26/24 1000 08/29/24 0400 08/31/24 1718   Weight: (!) 144.2 kg (317 lb 14.5 oz) (!) 142.1 kg (313 lb 4.4 oz) 135.2 kg (298 lb 1.6 oz)         Artificial Airway   None       Ventilator/Respiratory Therapy  None    Vent Weaning   Not applicable    Physical Exam  Constitutional: 58 y.o. male  A + O x4  Head: Normocephalic, atraumatic  Cardiovascular: Regular rhythm, tachycardic 90-100s rate  Pulmonary: Clear to auscultation bilaterally, no wheezes or rales. Breathing well on RA  GI: Abdomen soft, non-tender, non-distended (baseline habitus), bowel sounds +. Negative Murphy's sign.  Skin: No rashes or bruises, good turgor, cap refill <2s  Neuro:  Moves all extremities, no focal deficit  Musculoskeletal: No deformities  Lymphatic/extremities: Bilateral ankles and feet appear slightly edematous. No erythema but increased calor at ankles.    Laboratory:  Recent Labs     09/04/24  2114 09/05/24  0407 09/05/24  1616 09/06/24  0405 09/06/24  1616 09/07/24  0428   NA 135* 137 134* 136* 135* 135*   K 5.6* 4.2 4.4 5.1 4.5 4.2   CL 108 106 107 108 107 108   CO2 17* 19* 17* 17* 17* 15*   GAP 10 12 10 11 11 12    BUN 48* 53* 53* 51* 53* 48*   CR 2.07* 2.42* 2.21* 2.34* 2.25* 2.04*   GLU 106* 121* 147* 107* 183* 90   CA 8.8 9.4 9.1 9.3 9.3 9.7   ALBUMIN   --  2.9*  --  2.8*  --  3.3*   MG  --  2.1  --  1.8  --  1.8       Recent Labs     09/05/24  0407 09/06/24  0405 09/07/24  0428   WBC 14.00* 13.50* 16.90*   HGB 9.6* 10.0* 10.2*   HCT 29.0* 30.5* 30.6*   PLTCT 282 354 417*   AST 97* 201* 250*   ALT 105* 183* 321*   ALKPHOS 291* 278* 318*      Estimated Creatinine Clearance: 60.8 mL/min (A) (by C-G formula based on SCr of 2.04 mg/dL (H)).  Vitals:    08/26/24 1000 08/29/24 0400 08/31/24 1718   Weight: (!) 144.2 kg (317 lb 14.5 oz) (!) 142.1 kg (313 lb 4.4 oz) 135.2 kg (298 lb 1.6 oz)      No results for input(s): PHART, PO2ART in the last 72 hours.    Invalid input(s): PC02A        Pertinent radiology reviewed.    US  ABDOMEN LIMITED   Final Result         Continued findings of polycystic liver disease.      No evidence of biliary disease  Mild splenomegaly             Finalized by Maurie Houston, M.D. on 09/05/2024 4:01 PM. Dictated by Maurie Houston, M.D. on 09/05/2024 3:53 PM.      CHEST SINGLE VIEW   Final Result         Stable support devices.      Persistent small left pleural effusion with slight improved though unresolved left lower lobe consolidation.      Stable cardiomediastinal silhouette.          Finalized by Eleanor Don, M.D. on 08/29/2024 8:28 AM. Dictated by Eleanor Don, M.D. on 08/29/2024 8:27 AM.      FEEDING TUBE PLCMNT (ABD/CHEST LMTD)   Final Result   FINDINGS/IMPRESSION:      Gastric tube courses below the diaphragm with tip projecting over the gastric antrum.      Moderate gaseous distention of the stomach. Visualized bowel gas pattern nonobstructive.       Similar left lower lobe consolidation.      By my electronic signature, I attest that I have personally reviewed the images for this examination and formulated the interpretations and opinions expressed in this report          Finalized by Omar Pinal, D.O. on 08/28/2024 10:20 AM. Dictated by Ozell Mall, DO on 08/28/2024 9:17 AM.      CHEST SINGLE VIEW   Final Result         Endotracheal tube with the tip approximately 8 cm above the carina. This could be slightly advanced for more optimal positioning.      Gastric tube courses below the diaphragm and out of the field-of-view.      Increasing left lower lobe consolidation which could be infection, aspiration, or worsening atelectasis.          Finalized by Eleanor Don, M.D. on 08/27/2024 12:25 PM. Dictated by Eleanor Don, M.D. on 08/27/2024 12:23 PM.      FEEDING TUBE PLCMNT (ABD/CHEST LMTD)   Final Result         Gastric tube with sidehole projecting over the body of the stomach and tip projecting at the gastric antrum.       The stomach is mildly distended.       Patchy bibasilar opacities are better demonstrated on recent CT chest.      By my electronic signature, I attest that I have personally reviewed the images for this examination and formulated the interpretations and opinions expressed in this report          Finalized by Christobal SHAUNNA Sawyer, MD on 08/26/2024 1:35 PM. Dictated by Mont Lay, MD on 08/26/2024 1:28 PM.      US  VENOUS DOPPLER BILATERAL   Final Result         1.  Acute appearing nonocclusive thrombus in the left popliteal vein.      2.  No femoral/popliteal deep venous thrombosis in right lower extremity.      By my electronic signature, I attest that I have personally reviewed the images for this examination and formulated the interpretations and opinions expressed in this report          Finalized by Allean Pouch, M.D. on 08/26/2024 11:24 AM. Dictated by Mont Lay, MD on 08/26/2024 11:14 AM.      US  EXTREMITY LIMITED RIGHT   Final Result         1.  Acute appearing nonocclusive thrombus in the left popliteal vein.      2.  No femoral/popliteal deep venous thrombosis in right lower extremity.      By my electronic signature, I attest that I have personally reviewed the images for this examination and formulated the interpretations and opinions expressed in this report          Finalized by Allean Pouch, M.D. on 08/26/2024 11:24 AM. Dictated by Mont Lay, MD on 08/26/2024 11:14 AM.      CTA CHEST PULM EMBOLISM W/CONT   Final Result         CTA CHEST:      1. Multiple bilateral pulmonary emboli as described above, right greater than left. Elevated RV: LV ratio consistent with right heart strain, with dilatation of the right atrium, right ventricle and main pulmonary artery. Borderline cardiomegaly.   2. Moderate left lower lobe with additional areas of mild bilateral atelectasis, without significant pulmonary infarct or pleural effusion.      CT abdomen and pelvis:      1. Postsurgical changes of reported recent left robotic renal cyst decortication, with left perinephric stranding and multifocal gas. Small amount of free air is noted and likely postoperative. Surgical drain extending to the left lateral perinephric region, with no abdominal or pelvic fluid collection.   2. Redemonstration of marked bilateral renal enlargement with innumerable cysts, consistent with autosomal dominant polycystic kidney disease. 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   3. Bladder is decompressed about a Foley catheter, limiting evaluation.   4. Colonic diverticulosis without acute diverticulitis or bowel obstruction.   5. Trace pelvic free fluid, nonspecific though possibly postoperative.      Major preliminary findings including presence of extensive pulmonary emboli with right heart strain was conveyed to Dr. Iannazzo by Dairl messenger at 4:34 PM on 08/25/2024.          Finalized by Norleen Pane, M.D. on 08/25/2024 4:35 PM. Dictated by Norleen Pane, M.D. on 08/25/2024 4:12 PM.      CT ABD/PELV W CONTRAST   Final Result         CTA CHEST:      1. Multiple bilateral pulmonary emboli as described above, right greater than left. Elevated RV: LV ratio consistent with right heart strain, with dilatation of the right atrium, right ventricle and main pulmonary artery. Borderline cardiomegaly.   2. Moderate left lower lobe with additional areas of mild bilateral atelectasis, without significant pulmonary infarct or pleural effusion.      CT abdomen and pelvis:      1. Postsurgical changes of reported recent left robotic renal cyst decortication, with left perinephric stranding and multifocal gas. Small amount of free air is noted and likely postoperative. Surgical drain extending to the left lateral perinephric region, with no abdominal or pelvic fluid collection.   2. Redemonstration of marked bilateral renal enlargement with innumerable cysts, consistent with autosomal dominant polycystic kidney disease. 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   3. Bladder is decompressed about a Foley catheter, limiting evaluation.   4. Colonic diverticulosis without acute diverticulitis or bowel obstruction.   5. Trace pelvic free fluid, nonspecific though possibly postoperative.      Major preliminary findings including presence of extensive pulmonary emboli with right heart strain was conveyed to Dr. Iannazzo by Dairl messenger at 4:34 PM on 08/25/2024.          Finalized by Norleen Pane, M.D. on 08/25/2024 4:35 PM. Dictated by Norleen Pane, M.D. on 08/25/2024 4:12 PM.  2D + DOPPLER ECHO   Final Result      CHEST SINGLE VIEW   Final Result   FINDINGS/IMPRESSION:        Support Devices: Endotracheal tube has been slightly advanced, with the tip now projecting 3 cm above the carina. This could be slightly advanced for optimal positioning.      Lungs/Pleura: Inferior aspects of both lungs, particularly the left, are excluded from the field-of-view,. Patchy bibasilar opacities, likely atelectasis. No pneumothorax.      Heart and Mediastinum: The cardiomediastinal silhouette is stable.             Finalized by JOSETTE BEAGLE, M.D. on 08/25/2024 12:48 PM. Dictated by JOSETTE BEAGLE, M.D. on 08/25/2024 12:46 PM.      CHEST SINGLE VIEW   Final Result   FINDINGS/IMPRESSION:        Support Devices: Endotracheal tube in place, with the tip projecting 0.5 cm above the carina. Advancement is recommended..      Lungs/Pleura: Inferior aspect of the left lung, and inferior right costophrenic angle are excluded from the film.SABRA Patchy bibasilar opacities, likely atelectasis. Mild pulmonary edema.. No pneumothorax.SABRA      Heart and Mediastinum: Cardiac silhouette is incompletely visualized, although likely within normal limits.          Finalized by JOSETTE BEAGLE, M.D. on 08/25/2024 12:49 PM. Dictated by JOSETTE BEAGLE, M.D. on 08/25/2024 12:48 PM.           Scheduled Meds:allopurinoL  (ZYLOPRIM ) tablet 100 mg, 100 mg, Oral, QDAY  apixaban  (ELIQUIS ) tablet 5 mg, 5 mg, Oral, BID  [Held by Provider] atorvastatin  (LIPITOR) tablet 10 mg, 10 mg, Per NG tube, QDAY  bacitracin  zinc  topical ointment, , Topical, BID  colchicine  (COLCRYS ) tablet 0.6 mg, 0.6 mg, Oral, BID  [Held by Provider] lisinopriL  (ZESTRIL ) tablet 10 mg, 10 mg, Oral, QDAY  melatonin (MELATIN) tablet 6 mg, 6 mg, Oral, QHS  pantoprazole  DR (PROTONIX ) tablet 40 mg, 40 mg, Oral, QDAY  polyethylene glycol 3350  (MIRALAX ) packet 17 g, 1 packet, Feeding Tube, QDAY  predniSONE  (DELTASONE ) tablet 40 mg, 40 mg, Oral, QDAY  sennosides-docusate sodium  (SENOKOT-S) tablet 1 tablet, 1 tablet, Per OG Tube, QDAY  sodium bicarbonate  tablet 1,300 mg, 1,300 mg, Oral, BID  tamsulosin  (FLOMAX ) capsule 0.4 mg, 0.4 mg, Oral, QDAY after breakfast    Continuous Infusions:   sodium chloride  0.9% TKO infusion 5 mL/hr at 08/31/24 0528     PRN and Respiratory Meds:[Held by Provider] acetaminophen  Q6H PRN, albuterol -ipratropium Q4H PRN, dextrose  50% PRN, pancrelipase  20,880 Units/sodium bicarbonate  650 mg ([place] CLOG DESTROYER) PRN (On Call from Rx), sodium chloride  BID PRN      Malnutrition Details:              Loss of Subcutaneous Fat: No      Muscle Wasting: No Active Wounds                                                              [1]   Social History  Socioeconomic History    Marital status: Divorced   Tobacco Use    Smoking status: Never    Smokeless tobacco: Former     Types: Chew     Quit date: 1990    Tobacco comments:     Chewed  x10 years   Vaping Use    Vaping status: Never Used   Substance and Sexual Activity    Alcohol use: Yes     Alcohol/week: 2.0 standard drinks of alcohol     Types: 2 Cans of beer per week     Comment: Occasional    Drug use: Never   [2]   Current Facility-Administered Medications:     [Held by Provider] acetaminophen  (TYLENOL ) tablet 650 mg, 650 mg, Oral, Q6H PRN, Iannazzo, Emily G, DO, 650 mg at 09/03/24 2150    albuterol -ipratropium (DUONEB) nebulizer solution 3 mL, 3 mL, Inhalation, Q4H PRN, Iannazzo, Emily G, DO, 3 mL at 09/06/24 1214    allopurinoL  (ZYLOPRIM ) tablet 100 mg, 100 mg, Oral, QDAY, Dozier, Darian, DO, 100 mg at 09/07/24 1117    [COMPLETED] apixaban  (ELIQUIS ) tablet 10 mg, 10 mg, Oral, BID, 10 mg at 09/01/24 2015 **FOLLOWED BY** apixaban  (ELIQUIS ) tablet 5 mg, 5 mg, Oral, BID, Iannazzo, Emily G, DO, 5 mg at 09/07/24 1116    [Held by Provider] atorvastatin  (LIPITOR) tablet 10 mg, 10 mg, Per NG tube, QDAY, Iannazzo, Emily G, DO, 10 mg at 09/04/24 9143    bacitracin  zinc  topical ointment, , Topical, BID, Marty Toribio SAUNDERS, MD, Given at 09/06/24 2151    colchicine  (COLCRYS ) tablet 0.6 mg, 0.6 mg, Oral, BID, Dora Simeone, MD    dextrose  50% (D50) syringe 25-50 mL, 12.5-25 g, Intravenous, PRN, Dodd, Danica, MD    [facility] by Provider] lisinopriL  (ZESTRIL ) tablet 10 mg, 10 mg, Oral, QDAY, Iannazzo, Emily G, DO, 10 mg at 09/03/24 0912    melatonin (MELATIN) tablet 6 mg, 6 mg, Oral, QHS, Iannazzo, Emily G, DO, 6 mg at 09/06/24 2150    pancrelipase  20,880 Units/sodium bicarbonate  650 mg ([place] CLOG DESTROYER), , Feeding Tube, PRN (On Call from Rx), Janine Dukes, MD    pantoprazole  DR (PROTONIX ) tablet 40 mg, 40 mg, Oral, QDAY, Provider, Pharmacy, 40 mg at 09/07/24 1117    polyethylene glycol 3350  (MIRALAX ) packet 17 g, 1 packet, Feeding Tube, QDAY, Iannazzo, Emily G, DO, 17 g at 08/31/24 0800    predniSONE  (DELTASONE ) tablet 40 mg, 40 mg, Oral, QDAY, Dozier, Darian, DO, 40 mg at 09/07/24 1121    sennosides-docusate sodium  (SENOKOT-S) tablet 1 tablet, 1 tablet, Per OG Tube, QDAY, Iannazzo, Emily G, DO, 1 tablet at 09/07/24 1120    sodium bicarbonate  tablet 1,300 mg, 1,300 mg, Oral, BID, Agapito Ned, DO, 1,300 mg at 09/07/24 1117    sodium chloride  (NEBUSAL) 3 % nebulizer solution 4 mL, 4 mL, Inhalation, BID PRN, Antonetta Elspeth RODES, MD, 4 mL at 09/06/24 1214    sodium chloride  0.9% TKO infusion, , Intravenous, Continuous, Jobe, Amanda L, MD, Last Rate: 5 mL/hr at 08/31/24 0528, New Bag at 08/31/24 0528    tamsulosin  (FLOMAX ) capsule 0.4 mg, 0.4 mg, Oral, QDAY after breakfast, Nyle Tinnie SAILOR, MD, 0.4 mg at 09/07/24 1117

## 2024-09-07 NOTE — Progress Notes [1]
 RT Adult Assessment Note    NAME:Matthew Paul             MRN: [id number]             DOB:07/22/1966          AGE: 58 y.o.  ADMISSION DATE: 08/24/2024             DAYS ADMITTED: LOS: 13 days    Impressions of the patient: Patient resting comfortably with unlabored breathing.   Intervention(s)/outcome(s): No intervention required   Patient education that was completed: No education at this time  Recommendations to the care team: Begin incentive spirometry     Vital Signs:  Pulse: 101  RR: 14 PER MINUTE  SpO2: 96 %  O2 Device: None (Room air)  Liter Flow:    O2%:      Breath Sounds:   Right Apex Breath Sounds: Clear (Implies normal)  Right Base Breath Sounds: Decreased  Left Apex Breath Sounds: Clear (Implies normal)  Left Base Breath Sounds: Decreased  Respiratory Effort:   Respiratory Effort/Pattern: Unlabored  Comments:

## 2024-09-07 NOTE — Consults [2]
 Hepatology CONSULT NOTE     Patient Name:Matthew Paul         FMW:[id number]  Admission Date: 08/24/2024  9:57 AM  Admission diagnosis: ADPKD (autosomal dominant polycystic kidney disease) [Q61.2]  Acquired bilateral renal cysts [N28.1]     Principal Problem:    Acquired bilateral renal cysts  Active Problems:    ADPKD (autosomal dominant polycystic kidney disease)    Cardiac arrest (CMS-HCC)    Acute kidney injury superimposed on CKD    Obstructive cardiovascular shock (CMS-HCC)    Lactic acidosis    High anion gap metabolic acidosis    Acute pulmonary embolism with acute cor pulmonale (CMS-HCC)    Acute respiratory failure with hypoxia (CMS-HCC)    LOS: 13 Day     Hepatology has been consulted for raised liver enzymes.    ASSESSMENT AND PLAN     Active Problems:  Elevated liver enzymes, mixed pattern  Elevated GGT  Numerous hepatic cysts  History of autosomal dominant polycystic kidney disease s/p left robotic renal cyst decortication and cystoscopy with ureteral stent placement.   Recent cardiac arrest, PEA status post ICU admission and recovery      Relevant investigations:  USG abdomen  Continued findings of polycystic liver disease.   No evidence of biliary disease   Mild splenomegaly     CTA CHEST:     1. Multiple bilateral pulmonary emboli as described above, right greater   than left. Elevated RV: LV ratio consistent with right heart strain, with   dilatation of the right atrium, right ventricle and main pulmonary artery.   Borderline cardiomegaly.   2. Moderate left lower lobe with additional areas of mild bilateral   atelectasis, without significant pulmonary infarct or pleural effusion.     CT abdomen and pelvis:     1. Postsurgical changes of reported recent left robotic renal cyst   decortication, with left perinephric stranding and multifocal gas. Small   amount of free air is noted and likely postoperative. Surgical drain   extending to the left lateral perinephric region, with no abdominal or pelvic fluid collection.   2. Redemonstration of marked bilateral renal enlargement with innumerable   cysts, consistent with autosomal dominant polycystic kidney disease.   Numerous hepatic cysts are also redemonstrated compatible with hepatic   involvement from ADPKD.   3. Bladder is decompressed about a Foley catheter, limiting evaluation.   4. Colonic diverticulosis without acute diverticulitis or bowel   obstruction.   5. Trace pelvic free fluid, nonspecific though possibly postoperative.     Assessment:  Patient has spent more than 2 weeks in the hospital undergoing extensive robotic surgery for his left renal cyst, underwent PEA cardiac arrest with prolonged ICU course and hours started developing raised liver enzymes.  This insult is unlikely due to his longstanding history of liver cyst or tolvaptan  medication which he has been on it for years.  Most likely this could be a delayed liver response from the shock that patient underwent earlier in the hospitalization.  We cannot rule out other autoimmune panel, infectious panel which can cause raise liver enzymes as well.    Recommendations:  Trend LFTs  Hep A IgM, HBsAg, anti-HBc IgM, Hep C antibody ? HCV RNA  ANA, ASMA, AMA, IgG, IgM  Ferritin, transferrin saturation, ceruloplasmin, alpha-1 antitrypsin level  Hepatology will follow.       Plan of care has been communicated to the primary team.  Plan of care has been communicated to the  patient.  The benefits and risks of the procedure has been clearly explained along with alternatives.  They voiced understanding and all questions have been answered.      Maryln Stank, MD  PGY - 4 Fellow, Division of Gastroenterology, Hepatology & Motility  Available on Voalte me   09/07/2024 6:19 PM     Patient seen/discussed with staff Dr. Cinderella.  Kindly see their addendum for any changes.  ---------------------------------------------------------------------------------------------------------------------------------------------------------------------------------  HISTORY OF PRESENTING ILLNESS     Matthew Paul is a 58 y.o. male was admitted to [facility] on 08/24/2024 for complaints of planned left robotic renal cyst decortication and cystoscopy with ureteral stent placement.   Patient underwent surgery as planned and was unremarkable up until 24 hours when he underwent PEA cardiac arrest on 1115 with ROSC achieved after 2 rounds of epinephrine .  Patient was transferred to ICU for mechanical ventilation, pressor requirement and post ROSC recovery.  Patient spent 12 days in ICU to recover.  Patient had a past history of alcohol use and not been drinking recently.  He does not use any smoking or IV recreational drugs.  He takes an over-the-counter multivitamin but no herbal supplements.  Hepatology primary has been consulted because his liver enzymes were going up without any explanation and further evaluation from hepatology team.   Patient also told me that he has been taking tolvaptan  for a long time because he was enrolled in research study.    PMH:  Past Medical History:    Acid reflux    ADPKD (autosomal dominant polycystic kidney)    Apnea    Gout    Hyperlipidemia    Hypertension    Mild shortness of breath    Paroxysmal A-fib (CMS-HCC)       Current medications:  Medications Ordered Prior to Encounter[1]    PSH:  Surgical History:   Procedure Laterality Date    HX HERNIA REPAIR Right 1983    ROBOTIC LAPAROSCOPIC ABLATION RENAL CYSTS Right 06/08/2019    Performed by Erskin Lenis, MD at [facility] OR    ROBOT ASSISTED SURGERY N/A 06/08/2019    Performed by Erskin Lenis, MD at [facility] OR    CYSTOURETHROSCOPY WITH INDWELLING URETERAL STENT INSERTION Right 06/08/2019    Performed by Erskin Lenis, MD at [facility] OR    ROBOTIC ASSISTED LAPAROSCOPIC RENAL CYST DECORTICATION Left 10/02/2019 Performed by Erskin Lenis, MD at [facility] OR    CYSTOURETHROSCOPY WITH INDWELLING URETERAL STENT INSERTION Left 10/02/2019    Performed by Erskin Lenis, MD at [facility] OR    ABLATION, CYST, KIDNEY, LAPAROSCOPIC Left 08/24/2024    Performed by Erskin Lenis LABOR, MD at [facility] OR    ROBOT ASSISTED SURGERY Left 08/24/2024    Performed by Erskin Lenis LABOR, MD at [facility] OR    CYSTOURETHROSCOPY, WITH INDWELLING URETERAL STENT INSERTION Left 08/24/2024    Performed by Erskin Lenis LABOR, MD at [facility] OR    HX TONSILLECTOMY      KIDNEY CYST REMOVAL      Multiple    KNEE CARTILAGE SURGERY Bilateral        SH:  Social History     Socioeconomic History    Marital status: Divorced   Tobacco Use    Smoking status: Never    Smokeless tobacco: Former     Types: Chew     Quit date: 1990    Tobacco comments:     Chewed x10 years   Vaping Use    Vaping status: Never  Used   Substance and Sexual Activity    Alcohol use: Yes     Alcohol/week: 2.0 standard drinks of alcohol     Types: 2 Cans of beer per week     Comment: Occasional    Drug use: Never       FH:  No family history on file.    Review of Systems:  Please see HPI for additional pertinent documentation    OBJECTIVE:     Physical Exam:  Vitals:    09/07/24 0700 09/07/24 0800 09/07/24 1200 09/07/24 1805   BP:  138/82 (!) 147/81    BP Source:  Arm, Left Upper Arm, Left Upper    Pulse: 94 88 100 101   Temp:  36.7 ?C (98.1 ?F) 37.6 ?C (99.7 ?F)    SpO2:  97%  96%   O2 Device:  None (Room air) None (Room air) None (Room air)   O2 Liter Flow:       Weight:       Height:             General appearance  alert, cooperative, no distress,    Head  Normocephalic, atraumatic   Eyes  conjunctivae/corneas clear.    Throat Lips, mucosa, and tongue normal. Teeth and gums normal   Neck supple, symmetrical    Lungs   clear to auscultation bilaterally   Heart  regular rate and rhythm, S1, S2 normal, no murmur on my exam   Abdomen   soft, non-tender. Bowel sounds normal.    Extremities no cyanosis or edema   Skin Skin color, texture, turgor normal.    Neurologic Gross normal     IMAGING AND OTHER PERTINENT LABS REVIEWED IN EPIC.    Voice recognition software was used for this dictation and grammatorical errors may arise despite review. Feel free to contact me for any clarifications.         [1]   No current facility-administered medications on file prior to encounter.     Current Outpatient Medications on File Prior to Encounter   Medication Sig Dispense Refill    ascorbic acid 1,000 mg tablet Take one tablet by mouth daily.      aspirin  81 mg chewable tablet Chew one tablet by mouth daily. Take with food.      atorvastatin  (LIPITOR) 20 mg tablet Take one-half tablet by mouth daily.      calcium  carbonate (CALCIUM  600 PO) Take 1 tablet by mouth daily.      cetirizine  (ZYRTEC ) 10 mg tablet Take one tablet by mouth every morning. (Alternates between Claritin , Zyrtec , and Allegra to avoid tolerance buildup)      CHOLEcalciferoL  (vitamin D3) 1,000 units tablet Take one tablet by mouth daily.      colchicine  0.6 mg tablet Take one tablet by mouth as Needed (for gout flares).      fexofenadine (ALLEGRA) 180 mg tablet Take one tablet by mouth daily. (Alternates between Claritin , Zyrtec , and Allegra to avoid tolerance buildup)      Flaxseed Oil 1,000 mg cap Take one capsule by mouth daily.      hydroCHLOROthiazide  (HYDRODIURIL ) 25 mg tablet Take one tablet by mouth every morning. 90 tablet 2    [Paused] lisinopril  (PRINIVIL ; ZESTRIL ) 20 mg tablet Take one tablet by mouth daily.      loratadine  (CLARITIN ) 10 mg tablet Take one tablet by mouth every morning. (Alternates between Claritin , Zyrtec , and Allegra to avoid tolerance buildup)      omeprazole DR (  PRILOSEC) 40 mg capsule Take one capsule by mouth daily.      predniSONE  (DELTASONE ) 20 mg tablet Take one tablet by mouth as Needed (with Colchicine  for gout flares).      vitamin E 400 unit capsule Take one capsule by mouth daily.      vitamins, B complex tab Take two tablets by mouth daily.

## 2024-09-08 ENCOUNTER — Encounter: Admit: 2024-09-08 | Discharge: 2024-09-08 | Payer: BLUE CROSS/BLUE SHIELD

## 2024-09-08 LAB — CBC AND DIFF
~~LOC~~ BKR ABSOLUTE BASO COUNT: 0 10*3/uL (ref 0.00–0.20)
~~LOC~~ BKR ABSOLUTE EOS COUNT: 0 10*3/uL (ref 0.00–0.45)
~~LOC~~ BKR ABSOLUTE LYMPH COUNT: 1.1 10*3/uL (ref 1.00–4.80)
~~LOC~~ BKR ABSOLUTE MONO COUNT: 1.1 10*3/uL — ABNORMAL HIGH (ref 0.00–0.80)
~~LOC~~ BKR ABSOLUTE NEUTROPHIL: 13 10*3/uL — ABNORMAL HIGH (ref 1.80–7.00)
~~LOC~~ BKR BASOPHILS %: 0.2 % (ref 0.0–2.0)
~~LOC~~ BKR EOSINOPHILS %: 0.1 % (ref 0.0–5.0)
~~LOC~~ BKR HEMATOCRIT: 30 % — ABNORMAL LOW (ref 40.0–50.0)
~~LOC~~ BKR HEMOGLOBIN: 9.8 g/dL — ABNORMAL LOW (ref 13.5–16.5)
~~LOC~~ BKR LYMPHOCYTES %: 7.1 % — ABNORMAL LOW (ref 24.0–44.0)
~~LOC~~ BKR MCH: 30 pg (ref 26.0–34.0)
~~LOC~~ BKR MCV: 93 fL (ref 80.0–100.0)
~~LOC~~ BKR MDW (MONOCYTE DISTRIBUTION WIDTH): 19 (ref <=20.6)
~~LOC~~ BKR MONOCYTES %: 7 % (ref 4.0–12.0)
~~LOC~~ BKR RBC COUNT: 3.2 10*6/uL — ABNORMAL LOW (ref 4.40–5.50)
~~LOC~~ BKR WBC COUNT: 15 10*3/uL — ABNORMAL HIGH (ref 4.50–11.00)

## 2024-09-08 LAB — HEPATITIS A IGM

## 2024-09-08 LAB — FERRITIN: ~~LOC~~ BKR FERRITIN: 841 ng/mL — ABNORMAL HIGH (ref 30–300)

## 2024-09-08 LAB — TRANSFERRIN: ~~LOC~~ BKR TRANSFERRIN: 181 mg/dL — ABNORMAL LOW (ref 185–336)

## 2024-09-08 LAB — IMMUNOGLOBULIN G (IGG): ~~LOC~~ BKR IGG: 808 mg/dL (ref 762–1488)

## 2024-09-08 LAB — IRON + BINDING CAPACITY + %SAT+ FERRITIN
~~LOC~~ BKR % SATURATION: 13 % — ABNORMAL LOW (ref 28–42)
~~LOC~~ BKR FERRITIN: 620 ng/mL — ABNORMAL HIGH (ref 30–300)
~~LOC~~ BKR IRON BINDING: 258 ug/dL — ABNORMAL LOW (ref 270–380)
~~LOC~~ BKR IRON: 34 g/dL — ABNORMAL LOW (ref 50–185)
~~LOC~~ BKR TRANSFERRIN: 173 mg/dL — ABNORMAL LOW (ref 185–336)

## 2024-09-08 LAB — HEPATITIS C ANTIBODY W REFLEX HCV PCR QUANT

## 2024-09-08 LAB — HEPATITIS B CORE IGM AB

## 2024-09-08 LAB — PHOSPHORUS: ~~LOC~~ BKR PHOSPHORUS: 4.2 mg/dL (ref 2.0–4.5)

## 2024-09-08 LAB — HEPATITIS B SURFACE AG

## 2024-09-08 LAB — HEPATITIS B CORE AB TOT (IGG+IGM)

## 2024-09-08 LAB — IMMUNOGLOBULIN M (IGM): ~~LOC~~ BKR IGM: 64 mg/dL (ref 38–328)

## 2024-09-08 LAB — HEPATITIS A TOTAL AB (IGG+IGM)

## 2024-09-08 LAB — HEPATITIS B SURFACE AB

## 2024-09-08 MED ORDER — MAGNESIUM SULFATE IN D5W 1 GRAM/100 ML IV PGBK
1 g | INTRAVENOUS | 0 refills | Status: CP
Start: 2024-09-08 — End: ?
  Administered 2024-09-08: 14:00:00 1 g via INTRAVENOUS

## 2024-09-08 NOTE — Progress Notes [1]
 MEDICAL INTENSIVE CARE UNIT  PROGRESS NOTE       Patient's Name:  Matthew Paul MRN: [id number]   Patient's DOB: 19-Mar-1966   Today's Date:  09/08/2024  Admission Date: 08/24/2024  LOS: 14 days    Problem list  Principal Problem:    Acquired bilateral renal cysts  Active Problems:    ADPKD (autosomal dominant polycystic kidney disease)    Cardiac arrest (CMS-HCC)    Acute kidney injury superimposed on CKD    Obstructive cardiovascular shock (CMS-HCC)    Lactic acidosis    High anion gap metabolic acidosis    Acute pulmonary embolism with acute cor pulmonale (CMS-HCC)    Acute respiratory failure with hypoxia (CMS-HCC)      HOSPITAL COURSE SUMMARY     Matthew Paul is a 58 y.o. 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  Patient admitted initially on 11/14 for planned left robotic renal cyst decortication and cystoscopy with ureteral stent placement.  No complications during procedure and patient had existing JP drain placed.  Unfortunately, patient suffered PEA cardiac arrest on 11/15 with ROSC following 2 rounds of epinephrine .    Patient was transported to the ICU on 11/15 and initially required high vent settings [PEEP 10, FiO2 100%] and pressor support with NE and vaso.  Cardiology consulted for abnormal EKG with new RBBB.  Mild metabolic derangements on initial labs without electrolyte disturbances.  Formal TTE stat with EF 55%, grade 1 diastolic dysfunction, and moderate to severely dilated LV with PASP 34.  CTA chest 11/15 showing massive bilateral PE, right greater than left with significant RV strain.  CT AP without evidence of hemorrhage or acute abdominal pathology.  After much discussion with family regarding risk/benefit, started on therapeutic heparin  drip 11/15. The patient continued to be intubated and sedated due to high ventilation requirements over the next several days. Antibiotics were begun on 11/17 given fever and possible pneumonia on CXR. Switched from heparin  gtt to eliquis  on 11/20.  He was extubated on 11/21 and no longer required pressors. Downgraded to floor status on 11/22.     Interval update 09/08/2024:  > Continued gout pain in bilateral ankles and feet,continuing prednisone  40 mg qday  x5 days and now starting Colchicine  for 3 days as noted in A/P  > Cr is currently at historical baseline, no acute concerns at this time.  > Sodium bicarbonate  increased to 1300 mg BID  > worsening transaminitis, cholestatic pattern. Known many liver cysts. Pt asymptomatic. Hepatology consulted, now downtrending  >Needs at least 6 months of eliquis  considering his PE  > Accepted for [facility] IPR, insurance approved. Will assess for Monday discharge.    ASSESSMENT & PLAN     NEURO/PSYCH  #Delirium  - Initially post-code: moving all extremities, opening eyes, attempting to pull at tube prior to sedation   - A+O x4 post-extubation, no focal deficits  PLAN:  > Intermittent confusion overnight, oriented on exam this morning  > Delirium precautions    -----------------------------------------------------------------------------------------------------------------------------------------------------------------------------------------------------------------------  PULMONARY  #Mechanical intubation for airway protection s/p extubation 11/21  #Bilateral massive PE  - Intubated on 11/15 post PEA arrest. ETT advanced to 28 at the teeth   - Bedside POCUS concerning for RV overload and McConnell's sign.   - TTE: RV overload further c/f PE  - CTA Chest: massive multiple BL PE, R>L. Elevated  RV:LV c/w right heart strain w/ dilation of RA, RV, main PA  - Result of long discussion with family, pharmacy, interventional radiology, regarding overall multidisciplinary approach and decision for tPA versus heparin  was to initiate heparin .  Discussed risks/benefit with family at length especially given his recent surgical procedure yesterday and existing JP drain, along with potential for existing cerebral aneurysms that are often associated with autosomal dominant PKD.  They understand this and would like to continue forward with heparin  GGT.    PLAN:  > Continue pulmonary hygiene: flutter valve, duo nebs, hypertonic saline   > Continue Eliquis  5 mg BID     #Suspected left lower lobe pneumonia  - MRSA nares +  - 11/17 CXR: Increasing left lower lobe consolidation which could be infection, aspiration, or worsening atelectasis.   - s/p 4 day course IV steroids  -vancomycin  (MRSA nares +) and zosyn  (11/17-11/24) to treat pneumonia empirically due to fever and potential pneumonia on CXR  PLAN:  > Incentive spirometry, duonebs, and sodium chloride  3% nebulizers for airway clearance  -----------------------------------------------------------------------------------------------------------------------------------------------------------------------------------------------------------------------  CARDIOVASCULAR  #PEA arrest   #Shock - obstructive 2/2 PE , resolved  #Remote history of Afib (1980s) not on AC   #Volume Overload  - PEA arrest on 11/15, ROSC after 2 rounds of epi and total code lasting 4-6 minutes.  - PTA: ASA 81 mg daily  - EKG:               Previous OSH 2020: SR, Qtc 406               EKG (11/15 post code): Sinus tachy 100s, Qtc 452, new RBBB  - Pressors: NE, vaso   - Echo (11/15): EF 55%. No dia dysfunction. Mod to severely enlarged RV w/ intraventricular septal flattening. PASP 35 + CVP. No major valvular abnormalities.   - CTA Chest: massive bl PE with RV strain.   - CT AP: normal post-surgical changes.   - Trop: 138   PLAN:  > BP stable     #HLD   - PTA: atorvastatin  20 mg   - Lipid profile: chol 124, TG 208, HDL 33, LDL 82  PLAN:  > holding atorvastatin      #HTN  - PTA meds: HCTZ 25 mg qAM, lisinopril  20 mg daily  PLAN:  > Holding lisinopril ,  restart on discharge  > Hold HCTZ  due to hypotension, restart on discharge    -----------------------------------------------------------------------------------------------------------------------------------------------------------------------------------------------------------------------  FEN/GI  #Elevated LFTs  #GERD  #Constipation, resolved  - Post code LFTs AST 157 (13 on admit), ALT 163 (12 on admit). Tbili 0.4.  - PTA meds: omeprazole 40 mg daily   - Advanced to regular diet 11/24 after SLP eval  - GGTP 605  -11/28 Hep panel non reactive  - LFTs elevated 11/22, cholestatic injury pattern. Known innumerable liver cysts.  - Liver US : consistent with known cysts  - Hepatology consulted 11/28, obtaining autoimmune workup  PLAN:  >CTM daily CMP, LFTs now downtrending  > Daily CMP  > Continue PPI daily  > Bowel regimen PRN, Imodium  PRN due to frequent stools post extubation    -----------------------------------------------------------------------------------------------------------------------------------------------------------------------------------------------------------------------  RENAL  #ADPKD  #AKI on CKD   #HAGMA, mild likely 2/2 above further renal dysfunction  - PTA meds: jynarque  (valptan) 60 mg qAM / 30 mg qPM  - Baseline Cr 2.5 - 2.8   -11/14 underwent left robotic renal cyst decortication, cystoscopy, L ureteral stent placement for enlarging bilateral renal size causing pressure and discomfort in  the setting of ADPKD.  Total of roughly 100 cysts were decorticated with 19 large cysts sent for pathology.  Placement of left ureteral stent, confirmation prior to surgical completion that left ureter had no urine leak.  No overt post surgical complications.  - CT AP (11/15): normal post-operative changes  -JP drain removed by urology 11/23  -11/26 FENa 1.7  Recent Labs     09/06/24  1616 09/07/24  0428 09/08/24  0403   CR 2.25* 2.04* 1.97*   BUN 53* 48* 53*   GFR 33* 37* 38*     - Net IO Since Admission: -11,251.82 mL [09/08/24 0645]  -   Intake/Output Summary (Last 24 hours) at 09/08/2024 0645  Last data filed at 09/08/2024 0100  Gross per 24 hour   Intake 1000 ml   Output 2750 ml   Net -1750 ml   PLAN:  > 11/23 urology started flomax  and recommend maintain foley placement today and trial removal when he is more mobile in the coming days with PT/OT   > Continue strict I's and O's, avoid further nephrotoxic agents, renally dose medications as able.  > Likely ATN post diuresis, monitoring I/O carefully. Replacing fluids as per interval update.  > Increasing  Sodium bicarbonate  to 1300 mg BID     -----------------------------------------------------------------------------------------------------------------------------------------------------------------------------------------------------------------------  ENDOCRINE  #No acute concerns   PLAN:  > CTM    -----------------------------------------------------------------------------------------------------------------------------------------------------------------------------------------------------------------------  HEME/ONC  #DVT/PE  #Normocytic Anemia  #Thrombocytopenia  - 11/16 doppler BLE: acute appearing nonocclusive thrombus in L popliteal vein  Recent Labs     09/06/24  0405 09/07/24  0428 09/08/24  0403   HGB 10.0* 10.2* 9.8*   MCV 94.5 94.2 93.9   PLTCT 354 417* 423*   PLAN:  > Monitor HGB, Transfuse if Hgb <7 and PLT < 10   > Eliquis  10 mg BID through 11/24, now 5 mg BID   > No evidence of acute bleeding. Ongoing monitor for s/s of bleeding.     #Leukocytosis, likely reactive, resolved  - Initial leukocytosis downtrended, consistent with reactive leukocytosis due to surgery/cardiac arrest, until elevation on 11/18  PLAN:   > Likely due to initiation of steroids, last dose on 11/20, continue to monitor with daily CBC   -Further steroids starting 11/25 for gout  -----------------------------------------------------------------------------------------------------------------------------------------------------------------------------------------------------------------------  ID  #Febrile  #Suspected Left Lower Lobe Pneumonia  - Last febrile temperature at 2000 on 11/17  - MRSA nares +  - 11/17 CXR: Increasing left lower lobe consolidation which could be infection, aspiration, or worsening atelectasis.   - s/p 4 day course IV steroids   Recent Labs     09/06/24  0405 09/07/24  0428 09/08/24  0403   WBC 13.50* 16.90* 15.80*     - Temp (24hrs), Avg:37 ?C (98.6 ?F), Min:36.7 ?C (98.1 ?F), Max:37.6 ?C (99.7 ?F)      Culture Data Date collected Result   Blood cx 11/17 NG x 5d   Sputum cx 11/17 Normal oropharyngeal flora   Urine cx 11/17 NG x 24h   Blood cx 11/20 NG 3d     Antimicrobial First dose Last dose Comment   Ancef  11/14 11/14 X2, for perioperative abx   Vancomycin  11/17 11/24    Zosyn  11/17 11/24      PLAN:  > Follow cultures as above  > Tylenol  PRN for fever -> held per transaminitis    ----------------------------------------------------------------------------------------------------------------------------------------------------------------------------------------------------------------------  MSK/DERM  #Gout   PLAN:  > allopurinol  100 mg qday   > prednisone   40 mg qday  x5 days for increasing gout pain in bilateral ankles and feet  >Starting colchicine  1.2 mg this AM followed by 0.6 mg BID to start tonight for 4 more doses (total 3 days)    -----------------------------------------------------------------------------------------------------------------------------------------------------------------------------------------------------------------------  LDA/PROPHYLAXIS  Patient Lines/Drains/Airways Status       Active Lines:       Name Placement date Placement time Site Days    Indwelling Urinary Catheter 16 FR Standard 2-way 09/01/24  1730  -- 7    Peripheral IV 08/31/24 1342 Right Mid Upper Arm 20 G 08/31/24  1342  -- 8    Peripheral IV 08/31/24 1452 Left Mid Forearm 20 G 08/31/24  1452  -- 8                    Lines: PIV x2  Tubes: None  Urinary Catheter:  Yes  VTE ppx: SCDs, eliquis   GI:  PPI  Bowel regimen: miralax  and senna  Diet: Regular Diet  DIET REGULAR  Code status: Full Code     Patient discussed with Elspeth ELINORE Pesa, MD     Elsie Hand, MD, MPH  PGY-2 Internal Medicine    SUBJECTIVE     No acute events overnight. This morning, he is resting easily in bed. He has no new questions or concerns.    Past Medical History:    Acid reflux    ADPKD (autosomal dominant polycystic kidney)    Apnea    Gout    Hyperlipidemia    Hypertension    Mild shortness of breath    Paroxysmal A-fib (CMS-HCC)     Surgical History:   Procedure Laterality Date    HX HERNIA REPAIR Right 1983    ROBOTIC LAPAROSCOPIC ABLATION RENAL CYSTS Right 06/08/2019    Performed by Erskin Lenis, MD at [facility] OR    ROBOT ASSISTED SURGERY N/A 06/08/2019    Performed by Erskin Lenis, MD at [facility] OR    CYSTOURETHROSCOPY WITH INDWELLING URETERAL STENT INSERTION Right 06/08/2019    Performed by Erskin Lenis, MD at [facility] OR    ROBOTIC ASSISTED LAPAROSCOPIC RENAL CYST DECORTICATION Left 10/02/2019    Performed by Erskin Lenis, MD at [facility] OR    CYSTOURETHROSCOPY WITH INDWELLING URETERAL STENT INSERTION Left 10/02/2019    Performed by Erskin Lenis, MD at [facility] OR    ABLATION, CYST, KIDNEY, LAPAROSCOPIC Left 08/24/2024    Performed by Erskin Lenis LABOR, MD at [facility] OR    ROBOT ASSISTED SURGERY Left 08/24/2024    Performed by Erskin Lenis LABOR, MD at [facility] OR    CYSTOURETHROSCOPY, WITH INDWELLING URETERAL STENT INSERTION Left 08/24/2024    Performed by Erskin Lenis LABOR, MD at [facility] OR    HX TONSILLECTOMY      KIDNEY CYST REMOVAL      Multiple    KNEE CARTILAGE SURGERY Bilateral      No family history on file.  Social History[1]  Vaping/E-liquid Use    Vaping Use Never User      Vaping/E-liquid Substances    CBD No Nicotine No     Other No     Flavored No     THC No     Unknown No             Current Medications[2]       OBJECTIVE                          Vital Signs:  Last Filed                 Vital Signs: 24 Hour Range   BP: 126/80 (11/29 0400)  Temp: 37.2 ?C (99 ?F) (11/29 0400)  Pulse: 90 (11/29 0400)  Respirations: 15 PER MINUTE (11/29 0400)  SpO2: 93 % (11/29 0400)  O2 Device: None (Room air) (11/29 0400) BP: (123-147)/(80-94)   Temp:  [36.7 ?C (98.1 ?F)-37.6 ?C (99.7 ?F)]   Pulse:  [88-117]   Respirations:  [14 PER MINUTE-21 PER MINUTE]   SpO2:  [93 %-97 %]   O2 Device: None (Room air)   Intensity Pain Scale (Self Report): 3 (09/07/24 1200) Vitals:    08/26/24 1000 08/29/24 0400 08/31/24 1718   Weight: (!) 144.2 kg (317 lb 14.5 oz) (!) 142.1 kg (313 lb 4.4 oz) 135.2 kg (298 lb 1.6 oz)         Artificial Airway   None       Ventilator/Respiratory Therapy  None    Vent Weaning   Not applicable    Physical Exam  Constitutional: 58 y.o. male  A + O x4  Head: Normocephalic, atraumatic  Cardiovascular: Regular rhythm, tachycardic 90-100s rate  Pulmonary: Clear to auscultation bilaterally, no wheezes or rales. Breathing well on RA  GI: Abdomen soft, non-tender, non-distended (baseline habitus), bowel sounds +. Negative Murphy's sign.  Skin: No rashes or bruises, good turgor, cap refill <2s  Neuro:  Moves all extremities, no focal deficit  Musculoskeletal: No deformities  Lymphatic/extremities: Bilateral ankles and feet appear slightly edematous. No erythema but increased calor at ankles.    Laboratory:  Recent Labs     09/05/24  1616 09/06/24  0405 09/06/24  1616 09/07/24  0428 09/08/24  0403   NA 134* 136* 135* 135* 138   K 4.4 5.1 4.5 4.2 4.4   CL 107 108 107 108 109   CO2 17* 17* 17* 15* 16*   GAP 10 11 11 12  13*   BUN 53* 51* 53* 48* 53*   CR 2.21* 2.34* 2.25* 2.04* 1.97*   GLU 147* 107* 183* 90 93   CA 9.1 9.3 9.3 9.7 9.2   ALBUMIN   --  2.8*  --  3.3* 2.9*   MG  --  1.8  --  1.8 1.7   PO4  --   --   --   --  4.2 Recent Labs     09/06/24  0405 09/07/24  0428 09/08/24  0403   WBC 13.50* 16.90* 15.80*   HGB 10.0* 10.2* 9.8*   HCT 30.5* 30.6* 30.7*   PLTCT 354 417* 423*   AST 201* 250* 119*   ALT 183* 321* 262*   ALKPHOS 278* 318* 278*      Estimated Creatinine Clearance: 63 mL/min (A) (by C-G formula based on SCr of 1.97 mg/dL (H)).  Vitals:    08/26/24 1000 08/29/24 0400 08/31/24 1718   Weight: (!) 144.2 kg (317 lb 14.5 oz) (!) 142.1 kg (313 lb 4.4 oz) 135.2 kg (298 lb 1.6 oz)      No results for input(s): PHART, PO2ART in the last 72 hours.    Invalid input(s): PC02A        Pertinent radiology reviewed.    US  ABDOMEN LIMITED   Final Result         Continued findings of polycystic liver disease.      No evidence of biliary disease      Mild splenomegaly  Finalized by Maurie Houston, M.D. on 09/05/2024 4:01 PM. Dictated by Maurie Houston, M.D. on 09/05/2024 3:53 PM.      CHEST SINGLE VIEW   Final Result         Stable support devices.      Persistent small left pleural effusion with slight improved though unresolved left lower lobe consolidation.      Stable cardiomediastinal silhouette.          Finalized by Eleanor Don, M.D. on 08/29/2024 8:28 AM. Dictated by Eleanor Don, M.D. on 08/29/2024 8:27 AM.      FEEDING TUBE PLCMNT (ABD/CHEST LMTD)   Final Result   FINDINGS/IMPRESSION:      Gastric tube courses below the diaphragm with tip projecting over the gastric antrum.      Moderate gaseous distention of the stomach. Visualized bowel gas pattern nonobstructive.       Similar left lower lobe consolidation.      By my electronic signature, I attest that I have personally reviewed the images for this examination and formulated the interpretations and opinions expressed in this report          Finalized by Omar Pinal, D.O. on 08/28/2024 10:20 AM. Dictated by Ozell Mall, DO on 08/28/2024 9:17 AM.      CHEST SINGLE VIEW   Final Result         Endotracheal tube with the tip approximately 8 cm above the carina. This could be slightly advanced for more optimal positioning.      Gastric tube courses below the diaphragm and out of the field-of-view.      Increasing left lower lobe consolidation which could be infection, aspiration, or worsening atelectasis.          Finalized by Eleanor Don, M.D. on 08/27/2024 12:25 PM. Dictated by Eleanor Don, M.D. on 08/27/2024 12:23 PM.      FEEDING TUBE PLCMNT (ABD/CHEST LMTD)   Final Result         Gastric tube with sidehole projecting over the body of the stomach and tip projecting at the gastric antrum.       The stomach is mildly distended.       Patchy bibasilar opacities are better demonstrated on recent CT chest.      By my electronic signature, I attest that I have personally reviewed the images for this examination and formulated the interpretations and opinions expressed in this report          Finalized by Christobal SHAUNNA Sawyer, MD on 08/26/2024 1:35 PM. Dictated by Mont Lay, MD on 08/26/2024 1:28 PM.      US  VENOUS DOPPLER BILATERAL   Final Result         1.  Acute appearing nonocclusive thrombus in the left popliteal vein.      2.  No femoral/popliteal deep venous thrombosis in right lower extremity.      By my electronic signature, I attest that I have personally reviewed the images for this examination and formulated the interpretations and opinions expressed in this report          Finalized by Allean Pouch, M.D. on 08/26/2024 11:24 AM. Dictated by Mont Lay, MD on 08/26/2024 11:14 AM.      US  EXTREMITY LIMITED RIGHT   Final Result         1.  Acute appearing nonocclusive thrombus in the left popliteal vein.      2.  No femoral/popliteal deep venous thrombosis in right lower extremity.  By my electronic signature, I attest that I have personally reviewed the images for this examination and formulated the interpretations and opinions expressed in this report          Finalized by Allean Pouch, M.D. on 08/26/2024 11:24 AM. Dictated by Mont Lay, MD on 08/26/2024 11:14 AM.      CTA CHEST PULM EMBOLISM W/CONT   Final Result         CTA CHEST:      1. Multiple bilateral pulmonary emboli as described above, right greater than left. Elevated RV: LV ratio consistent with right heart strain, with dilatation of the right atrium, right ventricle and main pulmonary artery. Borderline cardiomegaly.   2. Moderate left lower lobe with additional areas of mild bilateral atelectasis, without significant pulmonary infarct or pleural effusion.      CT abdomen and pelvis:      1. Postsurgical changes of reported recent left robotic renal cyst decortication, with left perinephric stranding and multifocal gas. Small amount of free air is noted and likely postoperative. Surgical drain extending to the left lateral perinephric region, with no abdominal or pelvic fluid collection.   2. Redemonstration of marked bilateral renal enlargement with innumerable cysts, consistent with autosomal dominant polycystic kidney disease. 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   3. Bladder is decompressed about a Foley catheter, limiting evaluation.   4. Colonic diverticulosis without acute diverticulitis or bowel obstruction.   5. Trace pelvic free fluid, nonspecific though possibly postoperative.      Major preliminary findings including presence of extensive pulmonary emboli with right heart strain was conveyed to Dr. Iannazzo by Dairl messenger at 4:34 PM on 08/25/2024.          Finalized by Norleen Pane, M.D. on 08/25/2024 4:35 PM. Dictated by Norleen Pane, M.D. on 08/25/2024 4:12 PM.      CT ABD/PELV W CONTRAST   Final Result         CTA CHEST:      1. Multiple bilateral pulmonary emboli as described above, right greater than left. Elevated RV: LV ratio consistent with right heart strain, with dilatation of the right atrium, right ventricle and main pulmonary artery. Borderline cardiomegaly.   2. Moderate left lower lobe with additional areas of mild bilateral atelectasis, without significant pulmonary infarct or pleural effusion.      CT abdomen and pelvis:      1. Postsurgical changes of reported recent left robotic renal cyst decortication, with left perinephric stranding and multifocal gas. Small amount of free air is noted and likely postoperative. Surgical drain extending to the left lateral perinephric region, with no abdominal or pelvic fluid collection.   2. Redemonstration of marked bilateral renal enlargement with innumerable cysts, consistent with autosomal dominant polycystic kidney disease. 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   3. Bladder is decompressed about a Foley catheter, limiting evaluation.   4. Colonic diverticulosis without acute diverticulitis or bowel obstruction.   5. Trace pelvic free fluid, nonspecific though possibly postoperative.      Major preliminary findings including presence of extensive pulmonary emboli with right heart strain was conveyed to Dr. Iannazzo by Dairl messenger at 4:34 PM on 08/25/2024.          Finalized by Norleen Pane, M.D. on 08/25/2024 4:35 PM. Dictated by Norleen Pane, M.D. on 08/25/2024 4:12 PM.      2D + DOPPLER ECHO   Final Result  CHEST SINGLE VIEW   Final Result   FINDINGS/IMPRESSION:        Support Devices: Endotracheal tube has been slightly advanced, with the tip now projecting 3 cm above the carina. This could be slightly advanced for optimal positioning.      Lungs/Pleura: Inferior aspects of both lungs, particularly the left, are excluded from the field-of-view,. Patchy bibasilar opacities, likely atelectasis. No pneumothorax.      Heart and Mediastinum: The cardiomediastinal silhouette is stable.             Finalized by JOSETTE BEAGLE, M.D. on 08/25/2024 12:48 PM. Dictated by JOSETTE BEAGLE, M.D. on 08/25/2024 12:46 PM.      CHEST SINGLE VIEW   Final Result   FINDINGS/IMPRESSION:        Support Devices: Endotracheal tube in place, with the tip projecting 0.5 cm above the carina. Advancement is recommended..      Lungs/Pleura: Inferior aspect of the left lung, and inferior right costophrenic angle are excluded from the film.SABRA Patchy bibasilar opacities, likely atelectasis. Mild pulmonary edema.. No pneumothorax.SABRA      Heart and Mediastinum: Cardiac silhouette is incompletely visualized, although likely within normal limits.          Finalized by JOSETTE BEAGLE, M.D. on 08/25/2024 12:49 PM. Dictated by JOSETTE BEAGLE, M.D. on 08/25/2024 12:48 PM.           Scheduled Meds:allopurinoL  (ZYLOPRIM ) tablet 100 mg, 100 mg, Oral, QDAY  apixaban  (ELIQUIS ) tablet 5 mg, 5 mg, Oral, BID  [Held by Provider] atorvastatin  (LIPITOR) tablet 10 mg, 10 mg, Per NG tube, QDAY  bacitracin  zinc  topical ointment, , Topical, BID  colchicine  (COLCRYS ) tablet 0.6 mg, 0.6 mg, Oral, BID  [Held by Provider] lisinopriL  (ZESTRIL ) tablet 10 mg, 10 mg, Oral, QDAY  melatonin (MELATIN) tablet 6 mg, 6 mg, Oral, QHS  pantoprazole  DR (PROTONIX ) tablet 40 mg, 40 mg, Oral, QDAY  polyethylene glycol 3350  (MIRALAX ) packet 17 g, 1 packet, Feeding Tube, QDAY  predniSONE  (DELTASONE ) tablet 40 mg, 40 mg, Oral, QDAY  sennosides-docusate sodium  (SENOKOT-S) tablet 1 tablet, 1 tablet, Per OG Tube, QDAY  sodium bicarbonate  tablet 1,300 mg, 1,300 mg, Oral, BID  tamsulosin  (FLOMAX ) capsule 0.4 mg, 0.4 mg, Oral, QDAY after breakfast    Continuous Infusions:   sodium chloride  0.9% TKO infusion 5 mL/hr at 08/31/24 0528     PRN and Respiratory Meds:[Held by Provider] acetaminophen  Q6H PRN, dextrose  50% PRN, pancrelipase  20,880 Units/sodium bicarbonate  650 mg ([place] CLOG DESTROYER) PRN (On Call from Rx)      Malnutrition Details:              Loss of Subcutaneous Fat: No      Muscle Wasting: No                  Active Wounds                                          [1]   Social History  Socioeconomic History    Marital status: Divorced   Tobacco Use    Smoking status: Never    Smokeless tobacco: Former     Types: Chew     Quit date: 1990    Tobacco comments:     Chewed x10 years   Vaping Use    Vaping status: Never Used   Substance and Sexual Activity    Alcohol  use: Yes     Alcohol/week: 2.0 standard drinks of alcohol     Types: 2 Cans of beer per week     Comment: Occasional    Drug use: Never   [2]   Current Facility-Administered Medications:     [Held by Provider] acetaminophen  (TYLENOL ) tablet 650 mg, 650 mg, Oral, Q6H PRN, Iannazzo, Emily G, DO, 650 mg at 09/03/24 2150    allopurinoL  (ZYLOPRIM ) tablet 100 mg, 100 mg, Oral, QDAY, Dozier, Darian, DO, 100 mg at 09/07/24 1117    [COMPLETED] apixaban  (ELIQUIS ) tablet 10 mg, 10 mg, Oral, BID, 10 mg at 09/01/24 2015 **FOLLOWED BY** apixaban  (ELIQUIS ) tablet 5 mg, 5 mg, Oral, BID, Iannazzo, Emily G, DO, 5 mg at 09/07/24 2004    [Held by Provider] atorvastatin  (LIPITOR) tablet 10 mg, 10 mg, Per NG tube, QDAY, Iannazzo, Emily G, DO, 10 mg at 09/04/24 9143    bacitracin  zinc  topical ointment, , Topical, BID, Marty Toribio SAUNDERS, MD, Given at 09/07/24 2004    colchicine  (COLCRYS ) tablet 0.6 mg, 0.6 mg, Oral, BID, Alabed, Farouk, MD, 0.6 mg at 09/07/24 2004    dextrose  50% (D50) syringe 25-50 mL, 12.5-25 g, Intravenous, PRN, Dodd, Danica, MD    [facility] by Provider] lisinopriL  (ZESTRIL ) tablet 10 mg, 10 mg, Oral, QDAY, Iannazzo, Emily G, DO, 10 mg at 09/03/24 0912    melatonin (MELATIN) tablet 6 mg, 6 mg, Oral, QHS, Iannazzo, Emily G, DO, 6 mg at 09/07/24 2004    pancrelipase  20,880 Units/sodium bicarbonate  650 mg ([place] CLOG DESTROYER), , Feeding Tube, PRN (On Call from Rx), Janine Dukes, MD    pantoprazole  DR (PROTONIX ) tablet 40 mg, 40 mg, Oral, QDAY, Provider, Pharmacy, 40 mg at 09/07/24 1117    polyethylene glycol 3350  (MIRALAX ) packet 17 g, 1 packet, Feeding Tube, QDAY, Iannazzo, Emily G, DO, 17 g at 08/31/24 0800    predniSONE  (DELTASONE ) tablet 40 mg, 40 mg, Oral, QDAY, Dozier, Darian, DO, 40 mg at 09/07/24 1121    sennosides-docusate sodium  (SENOKOT-S) tablet 1 tablet, 1 tablet, Per OG Tube, QDAY, Iannazzo, Emily G, DO, 1 tablet at 09/07/24 1120    sodium bicarbonate  tablet 1,300 mg, 1,300 mg, Oral, BID, Agapito Ned, DO, 1,300 mg at 09/07/24 2004    sodium chloride  0.9% TKO infusion, , Intravenous, Continuous, Jobe, Amanda L, MD, Last Rate: 5 mL/hr at 08/31/24 0528, New Bag at 08/31/24 9471    tamsulosin  (FLOMAX ) capsule 0.4 mg, 0.4 mg, Oral, QDAY after breakfast, Nyle Tinnie SAILOR, MD, 0.4 mg at 09/07/24 1117

## 2024-09-08 NOTE — Progress Notes [1]
 Hepatology Progress Note    Name:  Matthew Paul   Unijb'd Date:  09/08/2024 1:51 PM         Assessment:  Matthew Paul is a 58 y.o. male was admitted to [facility] on 08/24/2024 for complaints of planned left robotic renal cyst decortication and cystoscopy with ureteral stent placement. Patient underwent surgery as planned and was unremarkable up until 24 hours when he underwent PEA cardiac arrest on 1115 with ROSC achieved after 2 rounds of epinephrine .  Patient was transferred to ICU for mechanical ventilation, pressor requirement and post ROSC recovery.  Patient spent 12 days in ICU to recover.  Patient had a past history of alcohol use and not been drinking recently.  He does not use any smoking or IV recreational drugs.  He takes an over-the-counter multivitamin but no herbal supplements.  Hepatology primary has been consulted because his liver enzymes were going up without any explanation and further evaluation from hepatology team.   Patient also told me that he has been taking tolvaptan  for a long time because he was enrolled in research study.    Abnormal LFTs  Elevated aminotransferases  Elevated alkaline phosphatase  Mixed hepatocellular/cholestatic pattern of injury  Autosomal dominant polycystic kidney disease  Hepatic cystic disease      Recommendations:  -Continue to trend LFTs, improved on labs today  - No immediate plans for liver biopsy at this time  - ANA, ASMA, AMA in process, IgG/IgM normal, ceruloplasmin in process, A1AT total in process  - Ordered A1AT phenotype  - Acute hepatitis panel negative  - Ordered chronic hepatitis/immunity labs.  If nonimmune to HAV/HBV, will recommend vaccination outpatient.  - It is also possible that uptrend in LFTs is related to cardiac etiology as patient has moderate to severe RV dilation with moderate reduction in RV systolic function.      Patient seen/discussed with Dr. Cinderella Amador Skiff MD, MPH  Pager [phone]  Available on [facility]  Fellow, Gastroenterology and Hepatology  PGY-6     _____________________________________________________________________________  Interval History  Patient seen and examined at bedside resting comfortably no acute distress.  Explained that LFTs have slightly downtrended on labs today but that we have additional serologies in process.  Discussed findings with family who is also present at bedside.  No further questions on my exam.  Explained that there are no plans for liver biopsy at this time and we will await LFT trend      ROS:  Constitutional:  Denies fevers  Cardiovascular:  Denies chest pain  Respiratory:  Denies SOB, cough  Gastrointestinal:  See Interval History above  Dermatologic:  Denies rash  Neurological:  Denies dizziness, localized weakness, parasthesias, loss of sensation/function, or syncope    Medications  Scheduled Meds:allopurinoL  (ZYLOPRIM ) tablet 100 mg, 100 mg, Oral, QDAY  apixaban  (ELIQUIS ) tablet 5 mg, 5 mg, Oral, BID  [Held by Provider] atorvastatin  (LIPITOR) tablet 10 mg, 10 mg, Per NG tube, QDAY  bacitracin  zinc  topical ointment, , Topical, BID  colchicine  (COLCRYS ) tablet 0.6 mg, 0.6 mg, Oral, BID  [Held by Provider] lisinopriL  (ZESTRIL ) tablet 10 mg, 10 mg, Oral, QDAY  melatonin (MELATIN) tablet 6 mg, 6 mg, Oral, QHS  pantoprazole  DR (PROTONIX ) tablet 40 mg, 40 mg, Oral, QDAY  polyethylene glycol 3350  (MIRALAX ) packet 17 g, 1 packet, Feeding Tube, QDAY  sennosides-docusate sodium  (SENOKOT-S) tablet 1 tablet, 1 tablet, Per OG Tube, QDAY  sodium bicarbonate  tablet 1,300 mg, 1,300 mg, Oral,  BID  tamsulosin  (FLOMAX ) capsule 0.4 mg, 0.4 mg, Oral, QDAY after breakfast    Continuous Infusions:   sodium chloride  0.9% TKO infusion 5 mL/hr at 08/31/24 0528     PRN and Respiratory Meds:[Held by Provider] acetaminophen  Q6H PRN, dextrose  50% PRN, pancrelipase  20,880 Units/sodium bicarbonate  650 mg ([place] CLOG DESTROYER) PRN (On Call from Rx)      Physical Examination Vital Signs: Last                  Vital Signs: 24 Hour Range   BP: 126/77 (11/29 1200)  Temp: 37.3 ?C (99.1 ?F) (11/29 1200)  Pulse: 88 (11/29 1200)  Respirations: 18 PER MINUTE (11/29 1200)  SpO2: 95 % (11/29 1200)  O2 Device: None (Room air) (11/29 1200) BP: (123-134)/(77-94)   Temp:  [36.7 ?C (98.1 ?F)-37.3 ?C (99.1 ?F)]   Pulse:  [84-117]   Respirations:  [13 PER MINUTE-21 PER MINUTE]   SpO2:  [93 %-96 %]   O2 Device: None (Room air)     GEN: no acute distress  SKIN: no jaundice  HEENT: atraumatic, normocephalic  CV: regular rhythm, no murmurs  CHEST: unlabored, symmetrical chest rise, good air entry  ABDOMEN: soft, NT/ND, no masses  EXTREMITIES: no edema  PSYCH: alert, oriented, appropriate affect      Laboratory   Hematology  Recent Labs     09/06/24  0405 09/07/24  0428 09/08/24  0403   WBC 13.50* 16.90* 15.80*   HGB 10.0* 10.2* 9.8*   HCT 30.5* 30.6* 30.7*   PLTCT 354 417* 423*   MCV 94.5 94.2 93.9     Chemistry  Recent Labs     09/06/24  0405 09/06/24  1616 09/07/24  0428 09/08/24  0403   NA 136* 135* 135* 138   K 5.1 4.5 4.2 4.4   CL 108 107 108 109   CO2 17* 17* 15* 16*   BUN 51* 53* 48* 53*   CR 2.34* 2.25* 2.04* 1.97*   GFR 31* 33* 37* 38*   GLU 107* 183* 90 93   CA 9.3 9.3 9.7 9.2   PO4  --   --   --  4.2   ALBUMIN  2.8*  --  3.3* 2.9*   ALKPHOS 278*  --  318* 278*   AST 201*  --  250* 119*   ALT 183*  --  321* 262*   TOTBILI 0.6  --  0.7 0.6       Radiology and other Diagnostics Review   Pertinent radiology reviewed.

## 2024-09-09 LAB — COMPREHENSIVE METABOLIC PANEL
~~LOC~~ BKR ALBUMIN: 2.9 g/dL — ABNORMAL LOW (ref 3.5–5.0)
~~LOC~~ BKR ALK PHOSPHATASE: 229 U/L — ABNORMAL HIGH (ref 25–110)
~~LOC~~ BKR ALT: 200 U/L — ABNORMAL HIGH (ref 7–56)
~~LOC~~ BKR ANION GAP: 9 (ref 3–12)
~~LOC~~ BKR AST: 67 U/L — ABNORMAL HIGH (ref 7–40)
~~LOC~~ BKR BLD UREA NITROGEN: 49 mg/dL — ABNORMAL HIGH (ref 7–25)
~~LOC~~ BKR CALCIUM: 9.3 mg/dL (ref 8.5–10.6)
~~LOC~~ BKR CO2: 19 mmol/L — ABNORMAL LOW (ref 21–30)
~~LOC~~ BKR CREATININE: 1.9 mg/dL — ABNORMAL HIGH (ref 0.40–1.24)
~~LOC~~ BKR GLOMERULAR FILTRATION RATE (GFR): 39 mL/min — ABNORMAL LOW (ref >60)
~~LOC~~ BKR GLUCOSE, RANDOM: 94 mg/dL (ref 70–100)
~~LOC~~ BKR SODIUM, SERUM: 136 mmol/L — ABNORMAL LOW (ref 137–147)
~~LOC~~ BKR TOTAL BILIRUBIN: 0.5 mg/dL (ref 0.2–1.3)
~~LOC~~ BKR TOTAL PROTEIN: 5.7 g/dL — ABNORMAL LOW (ref 6.0–8.0)

## 2024-09-09 LAB — CBC
~~LOC~~ BKR HEMATOCRIT: 30 % — ABNORMAL LOW (ref 40.0–50.0)
~~LOC~~ BKR HEMOGLOBIN: 10 g/dL — ABNORMAL LOW (ref 13.5–16.5)
~~LOC~~ BKR MCHC: 32 g/dL (ref 32.0–36.0)
~~LOC~~ BKR MCV: 94 fL (ref 80.0–100.0)
~~LOC~~ BKR MPV: 7.8 fL (ref 7.0–11.0)
~~LOC~~ BKR PLATELET COUNT: 416 10*3/uL — ABNORMAL HIGH (ref 150–400)
~~LOC~~ BKR RBC COUNT: 3.2 10*6/uL — ABNORMAL LOW (ref 4.40–5.50)
~~LOC~~ BKR RDW: 15 % — ABNORMAL HIGH (ref 11.0–15.0)
~~LOC~~ BKR WBC COUNT: 12 10*3/uL — ABNORMAL HIGH (ref 4.50–11.00)

## 2024-09-09 LAB — PHOSPHORUS: ~~LOC~~ BKR PHOSPHORUS: 3.5 mg/dL (ref 2.0–4.5)

## 2024-09-09 LAB — MAGNESIUM: ~~LOC~~ BKR MAGNESIUM: 1.6 mg/dL (ref 1.6–2.6)

## 2024-09-09 MED ORDER — COLCHICINE 0.6 MG PO TAB
.6 mg | Freq: Two times a day (BID) | ORAL | 0 refills | Status: DC
Start: 2024-09-09 — End: 2024-09-09

## 2024-09-09 MED ORDER — COLCHICINE 0.6 MG PO TAB
.6 mg | Freq: Two times a day (BID) | ORAL | 0 refills | Status: CP
Start: 2024-09-09 — End: ?
  Administered 2024-09-10 (×2): 0.6 mg via ORAL

## 2024-09-09 NOTE — Care Plan [600008]
 Problem: Discharge Planning  Goal: Participation in plan of care  Outcome: Goal Ongoing  Goal: Knowledge regarding plan of care  Outcome: Goal Ongoing  Goal: Prepared for discharge  Outcome: Goal Ongoing     Problem: Skin Integrity  Goal: Skin integrity intact  Outcome: Goal Ongoing  Goal: Healing of skin (Wound & Incision)  Outcome: Goal Ongoing  Goal: Healing of skin (Pressure Injury)  Outcome: Goal Ongoing     Problem: Glucose Management  Goal: Absence of hyperglycemia  Outcome: Goal Ongoing  Goal: Absence of Hypoglycemia  Outcome: Goal Ongoing  Goal: Glucose level within specified parameters  Outcome: Goal Ongoing     Problem: Moderate Fall Risk  Goal: Moderate Fall Risk  Outcome: Goal Ongoing     Problem: High Fall Risk  Goal: High Fall Risk  Outcome: Goal Ongoing

## 2024-09-09 NOTE — Progress Notes [1]
 MEDICAL INTENSIVE CARE UNIT  PROGRESS NOTE       Patient's Name:  Matthew Paul MRN: [id number]   Patient's DOB: 05/09/1966   Today's Date:  09/09/2024  Admission Date: 08/24/2024  LOS: 15 days    Problem list  Principal Problem:    Acquired bilateral renal cysts  Active Problems:    ADPKD (autosomal dominant polycystic kidney disease)    Cardiac arrest (CMS-HCC)    Acute kidney injury superimposed on CKD    Obstructive cardiovascular shock (CMS-HCC)    Lactic acidosis    High anion gap metabolic acidosis    Acute pulmonary embolism with acute cor pulmonale (CMS-HCC)    Acute respiratory failure with hypoxia (CMS-HCC)      HOSPITAL COURSE SUMMARY     Campbell Kray is a 58 y.o. 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  Patient admitted initially on 11/14 for planned left robotic renal cyst decortication and cystoscopy with ureteral stent placement.  No complications during procedure and patient had existing JP drain placed.  Unfortunately, patient suffered PEA cardiac arrest on 11/15 with ROSC following 2 rounds of epinephrine .    Patient was transported to the ICU on 11/15 and initially required high vent settings [PEEP 10, FiO2 100%] and pressor support with NE and vaso.  Cardiology consulted for abnormal EKG with new RBBB.  Mild metabolic derangements on initial labs without electrolyte disturbances.  Formal TTE stat with EF 55%, grade 1 diastolic dysfunction, and moderate to severely dilated LV with PASP 34.  CTA chest 11/15 showing massive bilateral PE, right greater than left with significant RV strain.  CT AP without evidence of hemorrhage or acute abdominal pathology.  After much discussion with family regarding risk/benefit, started on therapeutic heparin  drip 11/15. The patient continued to be intubated and sedated due to high ventilation requirements over the next several days. Antibiotics were begun on 11/17 given fever and possible pneumonia on CXR. Switched from heparin  gtt to eliquis  on 11/20.  He was extubated on 11/21 and no longer required pressors. Downgraded to floor status on 11/22.     Interval update 09/09/2024:  > Continued gout pain in bilateral ankles and feet. S/p prednisone  x5 days which did not help. Has now completed colchicine  x3 days and pain improved, ankles and feet also less hot and edematous. Will extend course of colchicine .  > Improving transaminitis. Thus far hepatology workup Is unrevealing. Hepatology note elevation may have been delayed response to shock.  >Needs at least 6 months of eliquis  considering his PE  > Accepted for [facility] IPR, insurance approved. Will assess for Monday discharge.    ASSESSMENT & PLAN     NEURO/PSYCH  #Delirium  - Initially post-code: moving all extremities, opening eyes, attempting to pull at tube prior to sedation   - A+O x4 post-extubation, no focal deficits  PLAN:  > Intermittent confusion overnight, oriented on exam this morning  > Delirium precautions    -----------------------------------------------------------------------------------------------------------------------------------------------------------------------------------------------------------------------  PULMONARY  #Mechanical intubation for airway protection s/p extubation 11/21  #Bilateral massive PE  - Intubated on 11/15 post PEA arrest. ETT advanced to 28 at the teeth   - Bedside POCUS concerning for RV overload and McConnell's sign.   - TTE: RV overload further c/f PE  - CTA Chest: massive multiple BL PE, R>L. Elevated RV:LV c/w right heart strain w/ dilation  of RA, RV, main PA  - Result of long discussion with family, pharmacy, interventional radiology, regarding overall multidisciplinary approach and decision for tPA versus heparin  was to initiate heparin .  Discussed risks/benefit with family at length especially given his recent surgical procedure yesterday and existing JP drain, along with potential for existing cerebral aneurysms that are often associated with autosomal dominant PKD.  They understand this and would like to continue forward with heparin  GGT.    PLAN:  > Continue pulmonary hygiene: flutter valve, duo nebs, hypertonic saline   > Continue Eliquis  5 mg BID     #Suspected left lower lobe pneumonia  - MRSA nares +  - 11/17 CXR: Increasing left lower lobe consolidation which could be infection, aspiration, or worsening atelectasis.   - s/p 4 day course IV steroids  -vancomycin  (MRSA nares +) and zosyn  (11/17-11/24) to treat pneumonia empirically due to fever and potential pneumonia on CXR  PLAN:  > Incentive spirometry, duonebs, and sodium chloride  3% nebulizers for airway clearance  -----------------------------------------------------------------------------------------------------------------------------------------------------------------------------------------------------------------------  CARDIOVASCULAR  #PEA arrest   #Shock - obstructive 2/2 PE , resolved  #Remote history of Afib (1980s) not on AC   #Volume Overload  - PEA arrest on 11/15, ROSC after 2 rounds of epi and total code lasting 4-6 minutes.  - PTA: ASA 81 mg daily  - EKG:               Previous OSH 2020: SR, Qtc 406               EKG (11/15 post code): Sinus tachy 100s, Qtc 452, new RBBB  - Pressors: NE, vaso   - Echo (11/15): EF 55%. No dia dysfunction. Mod to severely enlarged RV w/ intraventricular septal flattening. PASP 35 + CVP. No major valvular abnormalities.   - CTA Chest: massive bl PE with RV strain.   - CT AP: normal post-surgical changes.   - Trop: 138   PLAN:  > BP stable     #HLD   - PTA: atorvastatin  20 mg   - Lipid profile: chol 124, TG 208, HDL 33, LDL 82  PLAN:  > holding atorvastatin      #HTN  - PTA meds: HCTZ 25 mg qAM, lisinopril  20 mg daily  PLAN:  > Holding lisinopril ,  restart on discharge  > Hold HCTZ  due to hypotension, restart on discharge    -----------------------------------------------------------------------------------------------------------------------------------------------------------------------------------------------------------------------  FEN/GI  #Elevated LFTs  #GERD  #Constipation, resolved  - Post code LFTs AST 157 (13 on admit), ALT 163 (12 on admit). Tbili 0.4.  - PTA meds: omeprazole 40 mg daily   - Advanced to regular diet 11/24 after SLP eval  - GGTP 605  -11/28 Hep panel non reactive  - LFTs elevated 11/22, cholestatic injury pattern. Known innumerable liver cysts.  - Liver US : consistent with known cysts  - Hepatology consulted 11/28, obtaining autoimmune workup  PLAN:  >CTM daily CMP, LFTs now downtrending  > Daily CMP  > Continue PPI daily  > Bowel regimen PRN, Imodium  PRN due to frequent stools post extubation    -----------------------------------------------------------------------------------------------------------------------------------------------------------------------------------------------------------------------  RENAL  #ADPKD  #AKI on CKD   #HAGMA, mild likely 2/2 above further renal dysfunction  - PTA meds: jynarque  (valptan) 60 mg qAM / 30 mg qPM  - Baseline Cr 2.5 - 2.8   -11/14 underwent left robotic renal cyst decortication, cystoscopy, L ureteral stent placement for enlarging bilateral renal size causing pressure and discomfort in the setting of ADPKD.  Total of  roughly 100 cysts were decorticated with 19 large cysts sent for pathology.  Placement of left ureteral stent, confirmation prior to surgical completion that left ureter had no urine leak.  No overt post surgical complications.  - CT AP (11/15): normal post-operative changes  -JP drain removed by urology 11/23  -11/26 FENa 1.7  Recent Labs     09/07/24  0428 09/08/24  0403 09/09/24  0615   CR 2.04* 1.97* 1.93*   BUN 48* 53* 49*   GFR 37* 38* 39*     - Net IO Since Admission: -13,212.82 mL [09/09/24 1149]  -   Intake/Output Summary (Last 24 hours) at 09/09/2024 1149  Last data filed at 09/09/2024 1146  Gross per 24 hour   Intake 2424 ml   Output 4970 ml   Net -2546 ml   PLAN:  > 11/23 urology started flomax  and recommend maintain foley placement today and trial removal when he is more mobile in the coming days with PT/OT   > Continue strict I's and O's, avoid further nephrotoxic agents, renally dose medications as able.  > Likely ATN post diuresis, monitoring I/O carefully. Replacing fluids as per interval update.  > Sodium bicarbonate  to 1300 mg BID     -----------------------------------------------------------------------------------------------------------------------------------------------------------------------------------------------------------------------  ENDOCRINE  #No acute concerns   PLAN:  > CTM    -----------------------------------------------------------------------------------------------------------------------------------------------------------------------------------------------------------------------  HEME/ONC  #DVT/PE  #Normocytic Anemia  #Thrombocytopenia  - 11/16 doppler BLE: acute appearing nonocclusive thrombus in L popliteal vein  Recent Labs     09/07/24  0428 09/08/24  0403 09/09/24  0615   HGB 10.2* 9.8* 10.0*   MCV 94.2 93.9 94.1   PLTCT 417* 423* 416*   PLAN:  > Monitor HGB, Transfuse if Hgb <7 and PLT < 10   > Eliquis  10 mg BID through 11/24, now 5 mg BID   > No evidence of acute bleeding. Ongoing monitor for s/s of bleeding.     #Leukocytosis, likely reactive, resolved  - Initial leukocytosis downtrended, consistent with reactive leukocytosis due to surgery/cardiac arrest, until elevation on 11/18  PLAN:   > Likely due to initiation of steroids, last dose on 11/20, continue to monitor with daily CBC   -Further steroids starting 11/25 for gout, completed 11/29  -----------------------------------------------------------------------------------------------------------------------------------------------------------------------------------------------------------------------  ID  #Febrile  #Suspected Left Lower Lobe Pneumonia  - Last febrile temperature at 2000 on 11/17  - MRSA nares +  - 11/17 CXR: Increasing left lower lobe consolidation which could be infection, aspiration, or worsening atelectasis.   - s/p 4 day course IV steroids   Recent Labs     09/07/24  0428 09/08/24  0403 09/09/24  0615   WBC 16.90* 15.80* 12.50*     - Temp (24hrs), Avg:37.1 ?C (98.8 ?F), Min:36.9 ?C (98.4 ?F), Max:37.3 ?C (99.1 ?F)      Culture Data Date collected Result   Blood cx 11/17 NG x 5d   Sputum cx 11/17 Normal oropharyngeal flora   Urine cx 11/17 NG x 24h   Blood cx 11/20 NG 3d     Antimicrobial First dose Last dose Comment   Ancef  11/14 11/14 X2, for perioperative abx   Vancomycin  11/17 11/24    Zosyn  11/17 11/24      PLAN:  > Follow cultures as above  > Tylenol  PRN for fever -> held per transaminitis    ----------------------------------------------------------------------------------------------------------------------------------------------------------------------------------------------------------------------  MSK/DERM  #Gout   PLAN:  > allopurinol  100 mg qday   > prednisone  40 mg qday  x5 days for  increasing gout pain in bilateral ankles and feet (11/25-29)  > colchicine  0.6 mg BID     -----------------------------------------------------------------------------------------------------------------------------------------------------------------------------------------------------------------------  LDA/PROPHYLAXIS  Patient Lines/Drains/Airways Status       Active Lines:       Name Placement date Placement time Site Days    Indwelling Urinary Catheter 16 FR Standard 2-way 09/01/24  1730  -- 8    Peripheral IV 08/31/24 1342 Right Mid Upper Arm 20 G 08/31/24  1342  -- 9 Peripheral IV 08/31/24 1452 Left Mid Forearm 20 G 08/31/24  1452  -- 9                    Lines: PIV x2  Tubes: None  Urinary Catheter:  Yes  VTE ppx: SCDs, eliquis   GI:  PPI  Bowel regimen: miralax  and senna  Diet: Regular Diet  DIET REGULAR  Code status: Full Code     Patient discussed with Elspeth ELINORE Pesa, MD     Alois Hamlet, MD  PGY-1  Available on Voalte     SUBJECTIVE     No acute events overnight. This morning, he is resting easily in bed. Feels his gout pain has improved since starting colchicine . He has no new questions or concerns.    Past Medical History:    Acid reflux    ADPKD (autosomal dominant polycystic kidney)    Apnea    Gout    Hyperlipidemia    Hypertension    Mild shortness of breath    Paroxysmal A-fib (CMS-HCC)     Surgical History:   Procedure Laterality Date    HX HERNIA REPAIR Right 1983    ROBOTIC LAPAROSCOPIC ABLATION RENAL CYSTS Right 06/08/2019    Performed by Erskin Lenis, MD at [facility] OR    ROBOT ASSISTED SURGERY N/A 06/08/2019    Performed by Erskin Lenis, MD at [facility] OR    CYSTOURETHROSCOPY WITH INDWELLING URETERAL STENT INSERTION Right 06/08/2019    Performed by Erskin Lenis, MD at [facility] OR    ROBOTIC ASSISTED LAPAROSCOPIC RENAL CYST DECORTICATION Left 10/02/2019    Performed by Erskin Lenis, MD at [facility] OR    CYSTOURETHROSCOPY WITH INDWELLING URETERAL STENT INSERTION Left 10/02/2019    Performed by Erskin Lenis, MD at [facility] OR    ABLATION, CYST, KIDNEY, LAPAROSCOPIC Left 08/24/2024    Performed by Erskin Lenis LABOR, MD at [facility] OR    ROBOT ASSISTED SURGERY Left 08/24/2024    Performed by Erskin Lenis LABOR, MD at [facility] OR    CYSTOURETHROSCOPY, WITH INDWELLING URETERAL STENT INSERTION Left 08/24/2024    Performed by Erskin Lenis LABOR, MD at [facility] OR    HX TONSILLECTOMY      KIDNEY CYST REMOVAL      Multiple    KNEE CARTILAGE SURGERY Bilateral      No family history on file.  Social History[1]  Vaping/E-liquid Use    Vaping Use Never User      Vaping/E-liquid Substances    CBD No     Nicotine No     Other No     Flavored No     THC No     Unknown No             Current Medications[2]       OBJECTIVE                          Vital Signs: Last [employer]  Vital Signs: 24 Hour Range   BP: 121/75 (11/30 1143)  Temp: 36.9 ?C (98.5 ?F) (11/30 0800)  Pulse: 99 (11/30 1143)  Respirations: 20 PER MINUTE (11/30 1143)  SpO2: 97 % (11/30 1143)  O2 Device: None (Room air) (11/30 1143) BP: (121-151)/(75-89)   Temp:  [36.9 ?C (98.4 ?F)-37.3 ?C (99.1 ?F)]   Pulse:  [78-108]   Respirations:  [16 PER MINUTE-25 PER MINUTE]   SpO2:  [94 %-97 %]   O2 Device: None (Room air)   Intensity Pain Scale (Self Report): 3 (09/09/24 0800) Vitals:    08/26/24 1000 08/29/24 0400 08/31/24 1718   Weight: (!) 144.2 kg (317 lb 14.5 oz) (!) 142.1 kg (313 lb 4.4 oz) 135.2 kg (298 lb 1.6 oz)         Artificial Airway   None       Ventilator/Respiratory Therapy  None    Vent Weaning   Not applicable    Physical Exam  Constitutional: 58 y.o. male  A + O x4  Head: Normocephalic, atraumatic  Cardiovascular: Regular rhythm, tachycardic 90-100s rate  Pulmonary: Clear to auscultation bilaterally, no wheezes or rales. Breathing well on RA  GI: Abdomen soft, non-tender, non-distended (baseline habitus), bowel sounds +. Negative Murphy's sign.  Skin: No rashes or bruises, good turgor, cap refill <2s  Neuro:  Moves all extremities, no focal deficit  Musculoskeletal: No deformities  Lymphatic/extremities: Bilateral ankles and feet appear slightly edematous, improving. No erythema but increased calor at ankles.    Laboratory:  Recent Labs     09/06/24  1616 09/07/24  0428 09/08/24  0403 09/09/24  0615   NA 135* 135* 138 136*   K 4.5 4.2 4.4 4.1   CL 107 108 109 108   CO2 17* 15* 16* 19*   GAP 11 12 13* 9   BUN 53* 48* 53* 49*   CR 2.25* 2.04* 1.97* 1.93*   GLU 183* 90 93 94   CA 9.3 9.7 9.2 9.3   ALBUMIN   --  3.3* 2.9* 2.9*   MG  --  1.8 1.7 1.6   PO4  --   --  4.2 3.5       Recent Labs     09/07/24  0428 09/08/24  0403 09/09/24  0615   WBC 16.90* 15.80* 12.50*   HGB 10.2* 9.8* 10.0*   HCT 30.6* 30.7* 30.7*   PLTCT 417* 423* 416*   AST 250* 119* 67*   ALT 321* 262* 200*   ALKPHOS 318* 278* 229*      Estimated Creatinine Clearance: 64.3 mL/min (A) (by C-G formula based on SCr of 1.93 mg/dL (H)).  Vitals:    08/26/24 1000 08/29/24 0400 08/31/24 1718   Weight: (!) 144.2 kg (317 lb 14.5 oz) (!) 142.1 kg (313 lb 4.4 oz) 135.2 kg (298 lb 1.6 oz)      No results for input(s): PHART, PO2ART in the last 72 hours.    Invalid input(s): PC02A        Pertinent radiology reviewed.    US  ABDOMEN LIMITED   Final Result         Continued findings of polycystic liver disease.      No evidence of biliary disease      Mild splenomegaly             Finalized by Maurie Houston, M.D. on 09/05/2024 4:01 PM. Dictated by Maurie Houston, M.D. on 09/05/2024 3:53 PM.      CHEST SINGLE VIEW  Final Result         Stable support devices.      Persistent small left pleural effusion with slight improved though unresolved left lower lobe consolidation.      Stable cardiomediastinal silhouette.          Finalized by Eleanor Don, M.D. on 08/29/2024 8:28 AM. Dictated by Eleanor Don, M.D. on 08/29/2024 8:27 AM.      FEEDING TUBE PLCMNT (ABD/CHEST LMTD)   Final Result   FINDINGS/IMPRESSION:      Gastric tube courses below the diaphragm with tip projecting over the gastric antrum.      Moderate gaseous distention of the stomach. Visualized bowel gas pattern nonobstructive.       Similar left lower lobe consolidation.      By my electronic signature, I attest that I have personally reviewed the images for this examination and formulated the interpretations and opinions expressed in this report          Finalized by Omar Pinal, D.O. on 08/28/2024 10:20 AM. Dictated by Ozell Mall, DO on 08/28/2024 9:17 AM.      CHEST SINGLE VIEW   Final Result         Endotracheal tube with the tip approximately 8 cm above the carina. This could be slightly advanced for more optimal positioning.      Gastric tube courses below the diaphragm and out of the field-of-view.      Increasing left lower lobe consolidation which could be infection, aspiration, or worsening atelectasis.          Finalized by Eleanor Don, M.D. on 08/27/2024 12:25 PM. Dictated by Eleanor Don, M.D. on 08/27/2024 12:23 PM.      FEEDING TUBE PLCMNT (ABD/CHEST LMTD)   Final Result         Gastric tube with sidehole projecting over the body of the stomach and tip projecting at the gastric antrum.       The stomach is mildly distended.       Patchy bibasilar opacities are better demonstrated on recent CT chest.      By my electronic signature, I attest that I have personally reviewed the images for this examination and formulated the interpretations and opinions expressed in this report          Finalized by Christobal SHAUNNA Sawyer, MD on 08/26/2024 1:35 PM. Dictated by Mont Lay, MD on 08/26/2024 1:28 PM.      US  VENOUS DOPPLER BILATERAL   Final Result         1.  Acute appearing nonocclusive thrombus in the left popliteal vein.      2.  No femoral/popliteal deep venous thrombosis in right lower extremity.      By my electronic signature, I attest that I have personally reviewed the images for this examination and formulated the interpretations and opinions expressed in this report          Finalized by Allean Pouch, M.D. on 08/26/2024 11:24 AM. Dictated by Mont Lay, MD on 08/26/2024 11:14 AM.      US  EXTREMITY LIMITED RIGHT   Final Result         1.  Acute appearing nonocclusive thrombus in the left popliteal vein.      2.  No femoral/popliteal deep venous thrombosis in right lower extremity.      By my electronic signature, I attest that I have personally reviewed the images for this examination and formulated the interpretations and opinions expressed in this report  Finalized by Allean Pouch, M.D. on 08/26/2024 11:24 AM. Dictated by Mont Lay, MD on 08/26/2024 11:14 AM. CTA CHEST PULM EMBOLISM W/CONT   Final Result         CTA CHEST:      1. Multiple bilateral pulmonary emboli as described above, right greater than left. Elevated RV: LV ratio consistent with right heart strain, with dilatation of the right atrium, right ventricle and main pulmonary artery. Borderline cardiomegaly.   2. Moderate left lower lobe with additional areas of mild bilateral atelectasis, without significant pulmonary infarct or pleural effusion.      CT abdomen and pelvis:      1. Postsurgical changes of reported recent left robotic renal cyst decortication, with left perinephric stranding and multifocal gas. Small amount of free air is noted and likely postoperative. Surgical drain extending to the left lateral perinephric region, with no abdominal or pelvic fluid collection.   2. Redemonstration of marked bilateral renal enlargement with innumerable cysts, consistent with autosomal dominant polycystic kidney disease. 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   3. Bladder is decompressed about a Foley catheter, limiting evaluation.   4. Colonic diverticulosis without acute diverticulitis or bowel obstruction.   5. Trace pelvic free fluid, nonspecific though possibly postoperative.      Major preliminary findings including presence of extensive pulmonary emboli with right heart strain was conveyed to Dr. Iannazzo by Dairl messenger at 4:34 PM on 08/25/2024.          Finalized by Norleen Pane, M.D. on 08/25/2024 4:35 PM. Dictated by Norleen Pane, M.D. on 08/25/2024 4:12 PM.      CT ABD/PELV W CONTRAST   Final Result         CTA CHEST:      1. Multiple bilateral pulmonary emboli as described above, right greater than left. Elevated RV: LV ratio consistent with right heart strain, with dilatation of the right atrium, right ventricle and main pulmonary artery. Borderline cardiomegaly.   2. Moderate left lower lobe with additional areas of mild bilateral atelectasis, without significant pulmonary infarct or pleural effusion.      CT abdomen and pelvis:      1. Postsurgical changes of reported recent left robotic renal cyst decortication, with left perinephric stranding and multifocal gas. Small amount of free air is noted and likely postoperative. Surgical drain extending to the left lateral perinephric region, with no abdominal or pelvic fluid collection.   2. Redemonstration of marked bilateral renal enlargement with innumerable cysts, consistent with autosomal dominant polycystic kidney disease. 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   3. Bladder is decompressed about a Foley catheter, limiting evaluation.   4. Colonic diverticulosis without acute diverticulitis or bowel obstruction.   5. Trace pelvic free fluid, nonspecific though possibly postoperative.      Major preliminary findings including presence of extensive pulmonary emboli with right heart strain was conveyed to Dr. Iannazzo by Dairl messenger at 4:34 PM on 08/25/2024.          Finalized by Norleen Pane, M.D. on 08/25/2024 4:35 PM. Dictated by Norleen Pane, M.D. on 08/25/2024 4:12 PM.      2D + DOPPLER ECHO   Final Result      CHEST SINGLE VIEW   Final Result   FINDINGS/IMPRESSION:        Support Devices: Endotracheal tube has been slightly advanced, with the tip now projecting 3 cm above the carina. This could be  slightly advanced for optimal positioning.      Lungs/Pleura: Inferior aspects of both lungs, particularly the left, are excluded from the field-of-view,. Patchy bibasilar opacities, likely atelectasis. No pneumothorax.      Heart and Mediastinum: The cardiomediastinal silhouette is stable.             Finalized by JOSETTE BEAGLE, M.D. on 08/25/2024 12:48 PM. Dictated by JOSETTE BEAGLE, M.D. on 08/25/2024 12:46 PM.      CHEST SINGLE VIEW   Final Result   FINDINGS/IMPRESSION:        Support Devices: Endotracheal tube in place, with the tip projecting 0.5 cm above the carina. Advancement is recommended..      Lungs/Pleura: Inferior aspect of the left lung, and inferior right costophrenic angle are excluded from the film.SABRA Patchy bibasilar opacities, likely atelectasis. Mild pulmonary edema.. No pneumothorax.SABRA      Heart and Mediastinum: Cardiac silhouette is incompletely visualized, although likely within normal limits.          Finalized by JOSETTE BEAGLE, M.D. on 08/25/2024 12:49 PM. Dictated by JOSETTE BEAGLE, M.D. on 08/25/2024 12:48 PM.           Scheduled Meds:allopurinoL  (ZYLOPRIM ) tablet 100 mg, 100 mg, Oral, QDAY  apixaban  (ELIQUIS ) tablet 5 mg, 5 mg, Oral, BID  [Held by Provider] atorvastatin  (LIPITOR) tablet 10 mg, 10 mg, Per NG tube, QDAY  bacitracin  zinc  topical ointment, , Topical, BID  colchicine  (COLCRYS ) tablet 0.6 mg, 0.6 mg, Oral, BID  [Held by Provider] lisinopriL  (ZESTRIL ) tablet 10 mg, 10 mg, Oral, QDAY  melatonin (MELATIN) tablet 6 mg, 6 mg, Oral, QHS  pantoprazole  DR (PROTONIX ) tablet 40 mg, 40 mg, Oral, QDAY  polyethylene glycol 3350  (MIRALAX ) packet 17 g, 1 packet, Feeding Tube, QDAY  sennosides-docusate sodium  (SENOKOT-S) tablet 1 tablet, 1 tablet, Per OG Tube, QDAY  sodium bicarbonate  tablet 1,300 mg, 1,300 mg, Oral, BID  tamsulosin  (FLOMAX ) capsule 0.4 mg, 0.4 mg, Oral, QDAY after breakfast    Continuous Infusions:   sodium chloride  0.9% TKO infusion 5 mL/hr at 08/31/24 0528     PRN and Respiratory Meds:[Held by Provider] acetaminophen  Q6H PRN, dextrose  50% PRN, pancrelipase  20,880 Units/sodium bicarbonate  650 mg ([facility] CLOG DESTROYER) PRN (On Call from Rx)      Malnutrition Details:              Loss of Subcutaneous Fat: No      Muscle Wasting: No                  Active Wounds                                            [1]   Social History  Socioeconomic History    Marital status: Divorced   Tobacco Use    Smoking status: Never    Smokeless tobacco: Former     Types: Chew     Quit date: 1990    Tobacco comments:     Chewed x10 years   Vaping Use    Vaping status: Never Used   Substance and Sexual Activity    Alcohol use: Yes     Alcohol/week: 2.0 standard drinks of alcohol     Types: 2 Cans of beer per week     Comment: Occasional    Drug use: Never   [2]   Current Facility-Administered Medications:     [  Held by Provider] acetaminophen  (TYLENOL ) tablet 650 mg, 650 mg, Oral, Q6H PRN, Iannazzo, Emily G, DO, 650 mg at 09/03/24 2150    allopurinoL  (ZYLOPRIM ) tablet 100 mg, 100 mg, Oral, QDAY, Dozier, Darian, DO, 100 mg at 09/09/24 0843    [COMPLETED] apixaban  (ELIQUIS ) tablet 10 mg, 10 mg, Oral, BID, 10 mg at 09/01/24 2015 **FOLLOWED BY** apixaban  (ELIQUIS ) tablet 5 mg, 5 mg, Oral, BID, Iannazzo, Emily G, DO, 5 mg at 09/09/24 0843    [Held by Provider] atorvastatin  (LIPITOR) tablet 10 mg, 10 mg, Per NG tube, QDAY, Iannazzo, Emily G, DO, 10 mg at 09/04/24 9143    bacitracin  zinc  topical ointment, , Topical, BID, Marty Toribio SAUNDERS, MD, Given at 09/09/24 0847    colchicine  (COLCRYS ) tablet 0.6 mg, 0.6 mg, Oral, BID, Keniel Ralston, MD    dextrose  50% (D50) syringe 25-50 mL, 12.5-25 g, Intravenous, PRN, Dodd, Danica, MD    [employer] by Provider] lisinopriL  (ZESTRIL ) tablet 10 mg, 10 mg, Oral, QDAY, Iannazzo, Emily G, DO, 10 mg at 09/03/24 0912    melatonin (MELATIN) tablet 6 mg, 6 mg, Oral, QHS, Iannazzo, Emily G, DO, 6 mg at 09/08/24 2055    pancrelipase  20,880 Units/sodium bicarbonate  650 mg ( CLOG DESTROYER), , Feeding Tube, PRN (On Call from Rx), Janine Dukes, MD    pantoprazole  DR (PROTONIX ) tablet 40 mg, 40 mg, Oral, QDAY, Provider, Pharmacy, 40 mg at 09/09/24 9156    polyethylene glycol 3350  (MIRALAX ) packet 17 g, 1 packet, Feeding Tube, QDAY, Iannazzo, Emily G, DO, 17 g at 08/31/24 0800    sennosides-docusate sodium  (SENOKOT-S) tablet 1 tablet, 1 tablet, Per OG Tube, QDAY, Iannazzo, Emily G, DO, 1 tablet at 09/07/24 1120    sodium bicarbonate  tablet 1,300 mg, 1,300 mg, Oral, BID, Agapito Ned, DO, 1,300 mg at 09/09/24 9156    sodium chloride  0.9% TKO infusion, , Intravenous, Continuous, Jobe, Amanda L, MD, Last Rate: 5 mL/hr at 08/31/24 0528, New Bag at 08/31/24 9471    tamsulosin  (FLOMAX ) capsule 0.4 mg, 0.4 mg, Oral, QDAY after breakfast, Nyle Tinnie SAILOR, MD, 0.4 mg at 09/09/24 [phone]

## 2024-09-10 ENCOUNTER — Encounter: Admit: 2024-09-10 | Discharge: 2024-09-10 | Payer: BLUE CROSS/BLUE SHIELD

## 2024-09-10 ENCOUNTER — Inpatient Hospital Stay
Admission: RE | Admit: 2024-08-24 | Discharge: 2024-09-10 | Disposition: A | Discharge status: Rehabilitation Facility | Payer: BLUE CROSS/BLUE SHIELD

## 2024-09-10 DIAGNOSIS — K219 Gastro-esophageal reflux disease without esophagitis: Secondary | ICD-10-CM

## 2024-09-10 DIAGNOSIS — Z7409 Other reduced mobility: Secondary | ICD-10-CM

## 2024-09-10 DIAGNOSIS — A419 Sepsis, unspecified organism: Secondary | ICD-10-CM

## 2024-09-10 DIAGNOSIS — Z22322 Carrier or suspected carrier of Methicillin resistant Staphylococcus aureus: Secondary | ICD-10-CM

## 2024-09-10 DIAGNOSIS — K5900 Constipation, unspecified: Secondary | ICD-10-CM

## 2024-09-10 DIAGNOSIS — D649 Anemia, unspecified: Secondary | ICD-10-CM

## 2024-09-10 DIAGNOSIS — I2609 Other pulmonary embolism with acute cor pulmonale: Secondary | ICD-10-CM

## 2024-09-10 DIAGNOSIS — M25571 Pain in right ankle and joints of right foot: Secondary | ICD-10-CM

## 2024-09-10 DIAGNOSIS — K5730 Diverticulosis of large intestine without perforation or abscess without bleeding: Secondary | ICD-10-CM

## 2024-09-10 DIAGNOSIS — M109 Gout, unspecified: Secondary | ICD-10-CM

## 2024-09-10 DIAGNOSIS — N189 Chronic kidney disease, unspecified: Secondary | ICD-10-CM

## 2024-09-10 DIAGNOSIS — M25572 Pain in left ankle and joints of left foot: Secondary | ICD-10-CM

## 2024-09-10 DIAGNOSIS — N170 Acute kidney failure with tubular necrosis: Secondary | ICD-10-CM

## 2024-09-10 DIAGNOSIS — I480 Paroxysmal atrial fibrillation: Secondary | ICD-10-CM

## 2024-09-10 DIAGNOSIS — E669 Obesity, unspecified: Secondary | ICD-10-CM

## 2024-09-10 DIAGNOSIS — R5381 Other malaise: Secondary | ICD-10-CM

## 2024-09-10 DIAGNOSIS — E785 Hyperlipidemia, unspecified: Secondary | ICD-10-CM

## 2024-09-10 DIAGNOSIS — Z7982 Long term (current) use of aspirin: Secondary | ICD-10-CM

## 2024-09-10 DIAGNOSIS — E8770 Fluid overload, unspecified: Secondary | ICD-10-CM

## 2024-09-10 DIAGNOSIS — R339 Retention of urine, unspecified: Secondary | ICD-10-CM

## 2024-09-10 DIAGNOSIS — R1312 Dysphagia, oropharyngeal phase: Secondary | ICD-10-CM

## 2024-09-10 DIAGNOSIS — I4519 Other right bundle-branch block: Secondary | ICD-10-CM

## 2024-09-10 DIAGNOSIS — Z7901 Long term (current) use of anticoagulants: Secondary | ICD-10-CM

## 2024-09-10 DIAGNOSIS — Z8249 Family history of ischemic heart disease and other diseases of the circulatory system: Secondary | ICD-10-CM

## 2024-09-10 DIAGNOSIS — Z87891 Personal history of nicotine dependence: Secondary | ICD-10-CM

## 2024-09-10 DIAGNOSIS — Z79899 Other long term (current) drug therapy: Secondary | ICD-10-CM

## 2024-09-10 DIAGNOSIS — E872 Acidosis, unspecified: Secondary | ICD-10-CM

## 2024-09-10 DIAGNOSIS — I82432 Acute embolism and thrombosis of left popliteal vein: Secondary | ICD-10-CM

## 2024-09-10 DIAGNOSIS — J302 Other seasonal allergic rhinitis: Secondary | ICD-10-CM

## 2024-09-10 DIAGNOSIS — J189 Pneumonia, unspecified organism: Secondary | ICD-10-CM

## 2024-09-10 DIAGNOSIS — Q446 Cystic disease of liver: Secondary | ICD-10-CM

## 2024-09-10 DIAGNOSIS — K7200 Acute and subacute hepatic failure without coma: Secondary | ICD-10-CM

## 2024-09-10 DIAGNOSIS — I1310 Hypertensive heart and chronic kidney disease without heart failure, with stage 1 through stage 4 chronic kidney disease, or unspecified chronic kidney disease: Secondary | ICD-10-CM

## 2024-09-10 DIAGNOSIS — Z635 Disruption of family by separation and divorce: Secondary | ICD-10-CM

## 2024-09-10 DIAGNOSIS — M79671 Pain in right foot: Secondary | ICD-10-CM

## 2024-09-10 DIAGNOSIS — M79672 Pain in left foot: Secondary | ICD-10-CM

## 2024-09-10 DIAGNOSIS — N2881 Hypertrophy of kidney: Secondary | ICD-10-CM

## 2024-09-10 DIAGNOSIS — R008 Other abnormalities of heart beat: Secondary | ICD-10-CM

## 2024-09-10 DIAGNOSIS — R570 Cardiogenic shock: Secondary | ICD-10-CM

## 2024-09-10 DIAGNOSIS — I468 Cardiac arrest due to other underlying condition: Secondary | ICD-10-CM

## 2024-09-10 DIAGNOSIS — Z6835 Body mass index (BMI) 35.0-35.9, adult: Secondary | ICD-10-CM

## 2024-09-10 DIAGNOSIS — D696 Thrombocytopenia, unspecified: Secondary | ICD-10-CM

## 2024-09-10 LAB — ANTI-NUCLEAR ANTIBODY(ANA): ~~LOC~~ BKR ANTI-NUCLR AG,SCREEN: <80 (ref <80)

## 2024-09-10 LAB — COMPREHENSIVE METABOLIC PANEL
~~LOC~~ BKR ALBUMIN: 3 g/dL — ABNORMAL LOW (ref 3.5–5.0)
~~LOC~~ BKR ALK PHOSPHATASE: 215 U/L — ABNORMAL HIGH (ref 25–110)
~~LOC~~ BKR ALT: 171 U/L — ABNORMAL HIGH (ref 7–56)
~~LOC~~ BKR ANION GAP: 11 10*3/uL — ABNORMAL HIGH (ref 3–12)
~~LOC~~ BKR AST: 52 U/L — ABNORMAL HIGH (ref 7–40)
~~LOC~~ BKR BLD UREA NITROGEN: 42 mg/dL — ABNORMAL HIGH (ref 7–25)
~~LOC~~ BKR CALCIUM: 8.5 mg/dL — ABNORMAL HIGH (ref 8.5–10.6)
~~LOC~~ BKR CHLORIDE: 108 mmol/L — ABNORMAL LOW (ref 98–110)
~~LOC~~ BKR CO2: 18 mmol/L — ABNORMAL LOW (ref 21–30)
~~LOC~~ BKR CREATININE: 1.8 mg/dL — ABNORMAL HIGH (ref 0.40–1.24)
~~LOC~~ BKR GLOMERULAR FILTRATION RATE (GFR): 43 mL/min — ABNORMAL LOW (ref >60–4.80)
~~LOC~~ BKR POTASSIUM: 4.1 mmol/L — ABNORMAL LOW (ref 3.5–5.1)
~~LOC~~ BKR TOTAL BILIRUBIN: 0.5 mg/dL (ref 0.2–1.3)
~~LOC~~ BKR TOTAL PROTEIN: 5.9 g/dL — ABNORMAL LOW (ref 6.0–8.0)

## 2024-09-10 LAB — CBC AND DIFF
~~LOC~~ BKR ABSOLUTE BASO COUNT: 0.1 10*3/uL (ref 0.00–0.20)
~~LOC~~ BKR ABSOLUTE EOS COUNT: 0.1 10*3/uL (ref 0.00–0.45)
~~LOC~~ BKR ABSOLUTE MONO COUNT: 0.9 10*3/uL — ABNORMAL HIGH (ref 0.00–0.80)
~~LOC~~ BKR MCV: 93 fL (ref 80.0–100.0)
~~LOC~~ BKR MDW (MONOCYTE DISTRIBUTION WIDTH): 23 — ABNORMAL HIGH (ref <=20.6)
~~LOC~~ BKR RBC COUNT: 3.1 10*6/uL — ABNORMAL LOW (ref 4.40–5.50)
~~LOC~~ BKR WBC COUNT: 11 10*3/uL — ABNORMAL HIGH (ref 4.50–11.00)

## 2024-09-10 MED ORDER — SENNOSIDES-DOCUSATE SODIUM 8.6-50 MG PO TAB
1 | Freq: Every day | ORAL | 0 refills | Status: DC
Start: 2024-09-10 — End: 2024-09-10

## 2024-09-10 MED ORDER — SENNOSIDES-DOCUSATE SODIUM 8.6-50 MG PO TAB
1 | Freq: Two times a day (BID) | ORAL | 0 refills | Status: CN
Start: 2024-09-10 — End: ?

## 2024-09-10 MED ORDER — SIMETHICONE 80 MG PO CHEW
80 mg | ORAL | 0 refills | Status: AC | PRN
Start: 2024-09-10 — End: ?

## 2024-09-10 MED ORDER — TAMSULOSIN 0.4 MG PO CAP
.4 mg | Freq: Every day | ORAL | 0 refills | 90.00000 days | Status: AC
Start: 2024-09-10 — End: ?

## 2024-09-10 MED ORDER — LISINOPRIL 10 MG PO TAB
10 mg | ORAL_TABLET | Freq: Every day | ORAL | 0 refills | 90.00000 days | Status: AC
Start: 2024-09-10 — End: ?

## 2024-09-10 MED ORDER — SODIUM BICARBONATE 650 MG PO TAB
1300 mg | Freq: Two times a day (BID) | ORAL | 0 refills | 30.00000 days | Status: AC
Start: 2024-09-10 — End: ?

## 2024-09-10 MED ORDER — SENNOSIDES-DOCUSATE SODIUM 8.6-50 MG PO TAB
1 | Freq: Two times a day (BID) | ORAL | 0 refills | 30.00000 days | Status: AC
Start: 2024-09-10 — End: ?

## 2024-09-10 MED ORDER — OXYCODONE 5 MG PO TAB
5 mg | ORAL | 0 refills | 6.00000 days | Status: AC | PRN
Start: 2024-09-10 — End: ?

## 2024-09-10 MED ORDER — POLYETHYLENE GLYCOL 3350 17 GRAM PO PWPK
1 | Freq: Every day | ORAL | 0 refills | Status: DC
Start: 2024-09-10 — End: 2024-09-10

## 2024-09-10 MED ORDER — POLYETHYLENE GLYCOL 3350 17 GRAM/DOSE PO POWD
17 g | Freq: Every day | ORAL | 0 refills | 22.00000 days | Status: AC
Start: 2024-09-10 — End: ?

## 2024-09-10 MED ORDER — APIXABAN 5 MG PO TAB
5 mg | Freq: Two times a day (BID) | ORAL | 0 refills | 30.00000 days | Status: AC
Start: 2024-09-10 — End: ?

## 2024-09-10 MED ORDER — LISINOPRIL 20 MG PO TAB
10 mg | Freq: Every day | ORAL | 0 refills | Status: CN
Start: 2024-09-10 — End: ?

## 2024-09-10 NOTE — Case Mgmt DC Plan [600024]
 CMA Note:       Received a request from Melissa Hocevar, SWCM to arrange w/c transport for today from [place] to [facility] ([address]) for 1pm or later. [facility], [phone] booked ride for 1:30pm. Quoted $82.    Verdie Edison  Case Management Assistant  For additional assistance please contact SWCM *

## 2024-09-10 NOTE — Case Mgmt DC Plan [600024]
 Case Management Progress Note    NAME:Matthew Paul                          MRN: [id number]              DOB:1965/10/27          AGE: 58 y.o.  ADMISSION DATE: 08/24/2024             DAYS ADMITTED: LOS: 16 days      Today's Date: 09/10/2024    PLAN: Discharge today, 09/10/24, to [facility] at 1:30pm via [facility] wheelchair transportation.     Expected Discharge Date: 09/10/2024 1:30 PM  Is Patient Medically Stable: Yes   Are there Barriers to Discharge? No    INTERVENTION/DISPOSITION:  Discharge Planning              Discharge Planning: Inpatient Rehabilitation  SW reviewed EMR & participated in MICU1 huddle.Pt is medically stable for progression of care.   Pt has been clinically accepted to [facility]. Insurance authorization has been obtained and they have a bed available for the pt today. He will be going to room 410. Per [facility] in admissions, they will not pay for transport again because we already paid for a ride Friday and were unable to cancel due to late notice.   SW asked CMA to arrange wheelchair transportation for 1:00pm or later, billed to [place].  [facility] wheelchair transportation arranged for 1:30pm ($82).  Transfer packet in [place].  SW notified bedside RN of pick-up time & provided number for report: [phone]. Accepting RN is [place].  SW faxed d/c orders to [facility] (fax #: [phone]).  SW notified daughter [name] and [facility] of d/c plans & pick-up time.  No additional SW needs.  Transportation              Does the Patient Need Case Management to Arrange Discharge Transport? (ex: facility, ambulance, wheelchair/stretcher, Medicaid, cab, other): Yes  Type of Transport: Wheelchair fleeta  Will the Patient Use Family Transport?: No  Transportation Name, Phone and Availability #1: [name]  [phone]  Support              Support: Pt/Family Updates re:POC or DC Plan, Huddle/team update  Info or Referral                 Positive SDOH Domains and Potential Barriers     Medication Needs    Financial                 Legal                 Other                 Discharge Disposition    Selected Continued Care - Admitted Since 08/24/2024       [place] Destination Coordination complete.      Service Provider Services Address Phone Fax Patient Preferred    [facility]  Inpatient Rehabilitation [address], [place]  [place] [address] [phone] --                  Eleanor Copa, LMSW  Available on Voalte  Work Cell: [phone]

## 2024-09-10 NOTE — Care Plan [600008]
 Problem: Discharge Planning  Goal: Participation in plan of care  Outcome: Goal Ongoing  Goal: Knowledge regarding plan of care  Outcome: Goal Ongoing  Goal: Prepared for discharge  Outcome: Goal Ongoing     Problem: Skin Integrity  Goal: Skin integrity intact  Outcome: Goal Ongoing  Goal: Healing of skin (Wound & Incision)  Outcome: Goal Ongoing  Goal: Healing of skin (Pressure Injury)  Outcome: Goal Ongoing     Problem: Glucose Management  Goal: Absence of hyperglycemia  Outcome: Goal Ongoing  Goal: Absence of Hypoglycemia  Outcome: Goal Ongoing  Goal: Glucose level within specified parameters  Outcome: Goal Ongoing     Problem: Moderate Fall Risk  Goal: Moderate Fall Risk  Outcome: Goal Ongoing     Problem: High Fall Risk  Goal: High Fall Risk  Outcome: Goal Ongoing

## 2024-09-10 NOTE — Progress Notes [1]
 MEDICAL INTENSIVE CARE UNIT  PROGRESS NOTE       Patient's Name:  Matthew Paul MRN: [id number]   Patient's DOB: 12-Oct-1965   Today's Date:  09/10/2024  Admission Date: 08/24/2024  LOS: 16 days    Problem list  Principal Problem:    Acquired bilateral renal cysts  Active Problems:    ADPKD (autosomal dominant polycystic kidney disease)    Cardiac arrest (CMS-HCC)    Acute kidney injury superimposed on CKD    Obstructive cardiovascular shock (CMS-HCC)    Lactic acidosis    High anion gap metabolic acidosis    Acute pulmonary embolism with acute cor pulmonale (CMS-HCC)    Acute respiratory failure with hypoxia (CMS-HCC)      HOSPITAL COURSE SUMMARY     Segundo Makela is a 58 y.o. 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  Patient admitted initially on 11/14 for planned left robotic renal cyst decortication and cystoscopy with ureteral stent placement.  No complications during procedure and patient had existing JP drain placed.  Unfortunately, patient suffered PEA cardiac arrest on 11/15 with ROSC following 2 rounds of epinephrine .    Patient was transported to the ICU on 11/15 and initially required high vent settings [PEEP 10, FiO2 100%] and pressor support with NE and vaso.  Cardiology consulted for abnormal EKG with new RBBB.  Mild metabolic derangements on initial labs without electrolyte disturbances.  Formal TTE stat with EF 55%, grade 1 diastolic dysfunction, and moderate to severely dilated LV with PASP 34.  CTA chest 11/15 showing massive bilateral PE, right greater than left with significant RV strain.  CT AP without evidence of hemorrhage or acute abdominal pathology.  After much discussion with family regarding risk/benefit, started on therapeutic heparin  drip 11/15. The patient continued to be intubated and sedated due to high ventilation requirements over the next several days. Antibiotics were begun on 11/17 given fever and possible pneumonia on CXR. Switched from heparin  gtt to eliquis  on 11/20.  He was extubated on 11/21 and no longer required pressors. Downgraded to floor status on 11/22.     Interval update 09/10/2024:  > Gout pain greatly improved. Ankles and feet significantly less edematous. Last dose of colchicine  this AM.  > Further Improving transaminitis. Thus far hepatology workup unrevealing. Hepatology note elevation may have been delayed response to shock. No acute concerns at this time.  >Needs at least 6 months of eliquis  considering his PE  > Accepted for [facility] IPR, insurance approved. Bed available and discharged in the afternoon.    ASSESSMENT & PLAN     NEURO/PSYCH  #Delirium  - Initially post-code: moving all extremities, opening eyes, attempting to pull at tube prior to sedation   - A+O x4 post-extubation, no focal deficits  PLAN:  > Intermittent confusion overnight, oriented on exam this morning  > Delirium precautions    -----------------------------------------------------------------------------------------------------------------------------------------------------------------------------------------------------------------------  PULMONARY  #Mechanical intubation for airway protection s/p extubation 11/21  #Bilateral massive PE  - Intubated on 11/15 post PEA arrest. ETT advanced to 28 at the teeth   - Bedside POCUS concerning for RV overload and McConnell's sign.   - TTE: RV overload further c/f PE  - CTA Chest: massive multiple BL PE, R>L. Elevated RV:LV c/w right heart strain w/ dilation of RA, RV, main PA  - Result of long discussion with family, pharmacy,  interventional radiology, regarding overall multidisciplinary approach and decision for tPA versus heparin  was to initiate heparin .  Discussed risks/benefit with family at length especially given his recent surgical procedure yesterday and existing JP drain, along with potential for existing cerebral aneurysms that are often associated with autosomal dominant PKD.  They understand this and would like to continue forward with heparin  GGT.    PLAN:  > Continue pulmonary hygiene: flutter valve, duo nebs, hypertonic saline   > Continue Eliquis  5 mg BID     #Suspected left lower lobe pneumonia  - MRSA nares +  - 11/17 CXR: Increasing left lower lobe consolidation which could be infection, aspiration, or worsening atelectasis.   - s/p 4 day course IV steroids  -vancomycin  (MRSA nares +) and zosyn  (11/17-11/24) to treat pneumonia empirically due to fever and potential pneumonia on CXR  PLAN:  > Incentive spirometry, duonebs, and sodium chloride  3% nebulizers for airway clearance  -----------------------------------------------------------------------------------------------------------------------------------------------------------------------------------------------------------------------  CARDIOVASCULAR  #PEA arrest   #Shock - obstructive 2/2 PE , resolved  #Remote history of Afib (1980s) not on AC   #Volume Overload  - PEA arrest on 11/15, ROSC after 2 rounds of epi and total code lasting 4-6 minutes.  - PTA: ASA 81 mg daily  - EKG:               Previous OSH 2020: SR, Qtc 406               EKG (11/15 post code): Sinus tachy 100s, Qtc 452, new RBBB  - Pressors: NE, vaso   - Echo (11/15): EF 55%. No dia dysfunction. Mod to severely enlarged RV w/ intraventricular septal flattening. PASP 35 + CVP. No major valvular abnormalities.   - CTA Chest: massive bl PE with RV strain.   - CT AP: normal post-surgical changes.   - Trop: 138   PLAN:  > BP stable     #HLD   - PTA: atorvastatin  20 mg   - Lipid profile: chol 124, TG 208, HDL 33, LDL 82  PLAN:  > holding atorvastatin      #HTN  - PTA meds: HCTZ 25 mg qAM, lisinopril  20 mg daily  PLAN:  > Holding lisinopril ,  restart on discharge at 10 mg qday  > Hold HCTZ  due to hypotension, will not restart on discharge considering normotension    -----------------------------------------------------------------------------------------------------------------------------------------------------------------------------------------------------------------------  FEN/GI  #Elevated LFTs  #GERD  #Constipation, resolved  - Post code LFTs AST 157 (13 on admit), ALT 163 (12 on admit). Tbili 0.4.  - PTA meds: omeprazole 40 mg daily   - Advanced to regular diet 11/24 after SLP eval  - GGTP 605  -11/28 Hep panel non reactive  - LFTs elevated 11/22, cholestatic injury pattern. Known innumerable liver cysts.  - Liver US : consistent with known cysts  - Hepatology consulted 11/28, obtaining autoimmune workup  PLAN:  >CTM daily CMP, LFTs now downtrending  > Daily CMP  > Continue PPI daily  > Bowel regimen PRN, Imodium  PRN due to frequent stools post extubation    -----------------------------------------------------------------------------------------------------------------------------------------------------------------------------------------------------------------------  RENAL  #ADPKD  #AKI on CKD   #HAGMA, mild likely 2/2 above further renal dysfunction  - PTA meds: jynarque  (valptan) 60 mg qAM / 30 mg qPM  - Baseline Cr 2.5 - 2.8   -11/14 underwent left robotic renal cyst decortication, cystoscopy, L ureteral stent placement for enlarging bilateral renal size causing pressure and discomfort in the setting of ADPKD.  Total of roughly 100 cysts were decorticated with  19 large cysts sent for pathology.  Placement of left ureteral stent, confirmation prior to surgical completion that left ureter had no urine leak.  No overt post surgical complications.  - CT AP (11/15): normal post-operative changes  -JP drain removed by urology 11/23  -11/26 FENa 1.7  Recent Labs     09/08/24  0403 09/09/24  0615 09/10/24  0414   CR 1.97* 1.93* 1.81*   BUN 53* 49* 42*   GFR 38* 39* 43*     - Net IO Since Admission: -83,162.82 mL [09/10/24 1837]  -   Intake/Output Summary (Last 24 hours) at 09/10/2024 1837  Last data filed at 09/10/2024 0800  Gross per 24 hour   Intake 0 ml   Output 2200 ml   Net -2200 ml   PLAN:  > 11/23 urology started flomax  and recommend maintain foley placement today and trial removal when he is more mobile in the coming days with PT/OT   > Continue strict I's and O's, avoid further nephrotoxic agents, renally dose medications as able.  > Likely ATN post diuresis, monitoring I/O carefully. Replacing fluids as per interval update.  > Sodium bicarbonate  to 1300 mg BID     -----------------------------------------------------------------------------------------------------------------------------------------------------------------------------------------------------------------------  ENDOCRINE  #No acute concerns   PLAN:  > CTM    -----------------------------------------------------------------------------------------------------------------------------------------------------------------------------------------------------------------------  HEME/ONC  #DVT/PE  #Normocytic Anemia  #Thrombocytopenia  - 11/16 doppler BLE: acute appearing nonocclusive thrombus in L popliteal vein  Recent Labs     09/08/24  0403 09/09/24  0615 09/10/24  0414   HGB 9.8* 10.0* 9.9*   MCV 93.9 94.1 93.9   PLTCT 423* 416* 491*   PLAN:  > Monitor HGB, Transfuse if Hgb <7 and PLT < 10   > Eliquis  10 mg BID through 11/24, now 5 mg BID   > No evidence of acute bleeding. Ongoing monitor for s/s of bleeding.     #Leukocytosis, likely reactive, resolved  - Initial leukocytosis downtrended, consistent with reactive leukocytosis due to surgery/cardiac arrest, until elevation on 11/18  PLAN:   > Likely due to initiation of steroids, last dose on 11/20, continue to monitor with daily CBC   -Further steroids starting 11/25 for gout, completed 11/29  -----------------------------------------------------------------------------------------------------------------------------------------------------------------------------------------------------------------------  ID  #Febrile  #Suspected Left Lower Lobe Pneumonia  - Last febrile temperature at 2000 on 11/17  - MRSA nares +  - 11/17 CXR: Increasing left lower lobe consolidation which could be infection, aspiration, or worsening atelectasis.   - s/p 4 day course IV steroids   Recent Labs     09/08/24  0403 09/09/24  0615 09/10/24  0414   WBC 15.80* 12.50* 11.30*     - Temp (24hrs), Avg:36.9 ?C (98.4 ?F), Min:36.7 ?C (98 ?F), Max:37 ?C (98.6 ?F)      Culture Data Date collected Result   Blood cx 11/17 NG x 5d   Sputum cx 11/17 Normal oropharyngeal flora   Urine cx 11/17 NG x 24h   Blood cx 11/20 NG 3d     Antimicrobial First dose Last dose Comment   Ancef  11/14 11/14 X2, for perioperative abx   Vancomycin  11/17 11/24    Zosyn  11/17 11/24      PLAN:  > Follow cultures as above  > Tylenol  PRN for fever -> held per transaminitis    ----------------------------------------------------------------------------------------------------------------------------------------------------------------------------------------------------------------------  MSK/DERM  #Gout   PLAN:  > allopurinol  100 mg qday   > prednisone  40 mg qday  x5 days for increasing gout pain in bilateral ankles  and feet (11/25-29)  > colchicine  0.6 mg BID, final day today    -----------------------------------------------------------------------------------------------------------------------------------------------------------------------------------------------------------------------  LDA/PROPHYLAXIS  Patient Lines/Drains/Airways Status       Active Lines:       Name Placement date Placement time Site Days    Indwelling Urinary Catheter 16 FR Standard 2-way 09/01/24  1730  -- 9    Peripheral IV 08/31/24 1342 Right Mid Upper Arm 20 G 08/31/24  1342 -- 10    Peripheral IV 08/31/24 1452 Left Mid Forearm 20 G 08/31/24  1452  -- 10                    Lines: PIV x2  Tubes: None  Urinary Catheter:  Yes  VTE ppx: SCDs, eliquis   GI:  PPI  Bowel regimen: miralax  and senna  Diet: Regular Diet  Regular Diet  Code status: Prior     Patient discussed with Dr. Nada Alois Hamlet, MD  PGY-1  Available on Voalte     SUBJECTIVE     No acute events overnight. This morning, he is resting easily in bed. Feels his gout pain has much improved since starting colchicine . He has no new questions or concerns. Is excited to leave the hospital.    Past Medical History:    Acid reflux    ADPKD (autosomal dominant polycystic kidney)    Apnea    Gout    Hyperlipidemia    Hypertension    Mild shortness of breath    Paroxysmal A-fib (CMS-HCC)     Surgical History:   Procedure Laterality Date    HX HERNIA REPAIR Right 1983    ROBOTIC LAPAROSCOPIC ABLATION RENAL CYSTS Right 06/08/2019    Performed by Erskin Lenis, MD at [facility] OR    ROBOT ASSISTED SURGERY N/A 06/08/2019    Performed by Erskin Lenis, MD at [facility] OR    CYSTOURETHROSCOPY WITH INDWELLING URETERAL STENT INSERTION Right 06/08/2019    Performed by Erskin Lenis, MD at [facility] OR    ROBOTIC ASSISTED LAPAROSCOPIC RENAL CYST DECORTICATION Left 10/02/2019    Performed by Erskin Lenis, MD at [facility] OR    CYSTOURETHROSCOPY WITH INDWELLING URETERAL STENT INSERTION Left 10/02/2019    Performed by Erskin Lenis, MD at [facility] OR    ABLATION, CYST, KIDNEY, LAPAROSCOPIC Left 08/24/2024    Performed by Erskin Lenis LABOR, MD at [facility] OR    ROBOT ASSISTED SURGERY Left 08/24/2024    Performed by Erskin Lenis LABOR, MD at [facility] OR    CYSTOURETHROSCOPY, WITH INDWELLING URETERAL STENT INSERTION Left 08/24/2024    Performed by Erskin Lenis LABOR, MD at [facility] OR    HX TONSILLECTOMY      KIDNEY CYST REMOVAL      Multiple    KNEE CARTILAGE SURGERY Bilateral      No family history on file.  Social History[1]  Vaping/E-liquid Use    Vaping Use Never User Vaping/E-liquid Substances    CBD No     Nicotine No     Other No     Flavored No     THC No     Unknown No             Current Medications[2]       OBJECTIVE                          Vital Signs: Last [employer]  Vital Signs: 24 Hour Range   BP: 116/98 (12/01 1153)  Temp: 36.8 ?C (98.2 ?F) (12/01 1153)  Pulse: 120 (12/01 1153)  Respirations: 21 PER MINUTE (12/01 1153)  SpO2: 98 % (12/01 1153)  O2 Device: None (Room air) (12/01 1153) BP: (112-149)/(64-98)   Temp:  [36.7 ?C (98 ?F)-37 ?C (98.6 ?F)]   Pulse:  [83-120]   Respirations:  [16 PER MINUTE-21 PER MINUTE]   SpO2:  [96 %-98 %]   O2 Device: None (Room air)     Vitals:    08/26/24 1000 08/29/24 0400 08/31/24 1718   Weight: (!) 144.2 kg (317 lb 14.5 oz) (!) 142.1 kg (313 lb 4.4 oz) 135.2 kg (298 lb 1.6 oz)         Artificial Airway   None       Ventilator/Respiratory Therapy  None    Vent Weaning   Not applicable    Physical Exam  Constitutional: 58 y.o. male  A + O x4  Head: Normocephalic, atraumatic  Cardiovascular: Regular rhythm, tachycardic 90-100s rate  Pulmonary: Clear to auscultation bilaterally, no wheezes or rales. Breathing well on RA  GI: Abdomen soft, non-tender, non-distended (baseline habitus), bowel sounds +. Negative Murphy's sign.  Skin: No rashes or bruises, good turgor, cap refill <2s  Neuro:  Moves all extremities, no focal deficit  Musculoskeletal: No deformities  Lymphatic/extremities: Bilateral ankles and feet appear slightly edematous, improving. No erythema but increased calor at ankles.    Laboratory:  Recent Labs     09/08/24  0403 09/09/24  0615 09/10/24  0414   NA 138 136* 137   K 4.4 4.1 4.1   CL 109 108 108   CO2 16* 19* 18*   GAP 13* 9 11   BUN 53* 49* 42*   CR 1.97* 1.93* 1.81*   GLU 93 94 88   CA 9.2 9.3 8.5   ALBUMIN  2.9* 2.9* 3.0*   MG 1.7 1.6  --    PO4 4.2 3.5  --        Recent Labs     09/08/24  0403 09/09/24  0615 09/10/24  0414   WBC 15.80* 12.50* 11.30*   HGB 9.8* 10.0* 9.9*   HCT 30.7* 30.7* 29.8*   PLTCT 423* 416* 491*   AST 119* 67* 52*   ALT 262* 200* 171*   ALKPHOS 278* 229* 215*      Estimated Creatinine Clearance: 68.6 mL/min (A) (by C-G formula based on SCr of 1.81 mg/dL (H)).  Vitals:    08/26/24 1000 08/29/24 0400 08/31/24 1718   Weight: (!) 144.2 kg (317 lb 14.5 oz) (!) 142.1 kg (313 lb 4.4 oz) 135.2 kg (298 lb 1.6 oz)      No results for input(s): PHART, PO2ART in the last 72 hours.    Invalid input(s): PC02A        Pertinent radiology reviewed.    US  ABDOMEN LIMITED   Final Result         Continued findings of polycystic liver disease.      No evidence of biliary disease      Mild splenomegaly             Finalized by Maurie Houston, M.D. on 09/05/2024 4:01 PM. Dictated by Maurie Houston, M.D. on 09/05/2024 3:53 PM.      CHEST SINGLE VIEW   Final Result         Stable support devices.      Persistent small left pleural effusion  with slight improved though unresolved left lower lobe consolidation.      Stable cardiomediastinal silhouette.          Finalized by Eleanor Don, M.D. on 08/29/2024 8:28 AM. Dictated by Eleanor Don, M.D. on 08/29/2024 8:27 AM.      FEEDING TUBE PLCMNT (ABD/CHEST LMTD)   Final Result   FINDINGS/IMPRESSION:      Gastric tube courses below the diaphragm with tip projecting over the gastric antrum.      Moderate gaseous distention of the stomach. Visualized bowel gas pattern nonobstructive.       Similar left lower lobe consolidation.      By my electronic signature, I attest that I have personally reviewed the images for this examination and formulated the interpretations and opinions expressed in this report          Finalized by Omar Pinal, D.O. on 08/28/2024 10:20 AM. Dictated by Ozell Mall, DO on 08/28/2024 9:17 AM.      CHEST SINGLE VIEW   Final Result         Endotracheal tube with the tip approximately 8 cm above the carina. This could be slightly advanced for more optimal positioning.      Gastric tube courses below the diaphragm and out of the field-of-view.      Increasing left lower lobe consolidation which could be infection, aspiration, or worsening atelectasis.          Finalized by Eleanor Don, M.D. on 08/27/2024 12:25 PM. Dictated by Eleanor Don, M.D. on 08/27/2024 12:23 PM.      FEEDING TUBE PLCMNT (ABD/CHEST LMTD)   Final Result         Gastric tube with sidehole projecting over the body of the stomach and tip projecting at the gastric antrum.       The stomach is mildly distended.       Patchy bibasilar opacities are better demonstrated on recent CT chest.      By my electronic signature, I attest that I have personally reviewed the images for this examination and formulated the interpretations and opinions expressed in this report          Finalized by Christobal SHAUNNA Sawyer, MD on 08/26/2024 1:35 PM. Dictated by Mont Lay, MD on 08/26/2024 1:28 PM.      US  VENOUS DOPPLER BILATERAL   Final Result         1.  Acute appearing nonocclusive thrombus in the left popliteal vein.      2.  No femoral/popliteal deep venous thrombosis in right lower extremity.      By my electronic signature, I attest that I have personally reviewed the images for this examination and formulated the interpretations and opinions expressed in this report          Finalized by Allean Pouch, M.D. on 08/26/2024 11:24 AM. Dictated by Mont Lay, MD on 08/26/2024 11:14 AM.      US  EXTREMITY LIMITED RIGHT   Final Result         1.  Acute appearing nonocclusive thrombus in the left popliteal vein.      2.  No femoral/popliteal deep venous thrombosis in right lower extremity.      By my electronic signature, I attest that I have personally reviewed the images for this examination and formulated the interpretations and opinions expressed in this report          Finalized by Allean Pouch, M.D. on 08/26/2024 11:24 AM. Dictated by Mont Lay, MD on  08/26/2024 11:14 AM.      CTA CHEST PULM EMBOLISM W/CONT   Final Result         CTA CHEST:      1. Multiple bilateral pulmonary emboli as described above, right greater than left. Elevated RV: LV ratio consistent with right heart strain, with dilatation of the right atrium, right ventricle and main pulmonary artery. Borderline cardiomegaly.   2. Moderate left lower lobe with additional areas of mild bilateral atelectasis, without significant pulmonary infarct or pleural effusion.      CT abdomen and pelvis:      1. Postsurgical changes of reported recent left robotic renal cyst decortication, with left perinephric stranding and multifocal gas. Small amount of free air is noted and likely postoperative. Surgical drain extending to the left lateral perinephric region, with no abdominal or pelvic fluid collection.   2. Redemonstration of marked bilateral renal enlargement with innumerable cysts, consistent with autosomal dominant polycystic kidney disease. 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   3. Bladder is decompressed about a Foley catheter, limiting evaluation.   4. Colonic diverticulosis without acute diverticulitis or bowel obstruction.   5. Trace pelvic free fluid, nonspecific though possibly postoperative.      Major preliminary findings including presence of extensive pulmonary emboli with right heart strain was conveyed to Dr. Iannazzo by Dairl messenger at 4:34 PM on 08/25/2024.          Finalized by Norleen Pane, M.D. on 08/25/2024 4:35 PM. Dictated by Norleen Pane, M.D. on 08/25/2024 4:12 PM.      CT ABD/PELV W CONTRAST   Final Result         CTA CHEST:      1. Multiple bilateral pulmonary emboli as described above, right greater than left. Elevated RV: LV ratio consistent with right heart strain, with dilatation of the right atrium, right ventricle and main pulmonary artery. Borderline cardiomegaly.   2. Moderate left lower lobe with additional areas of mild bilateral atelectasis, without significant pulmonary infarct or pleural effusion.      CT abdomen and pelvis:      1. Postsurgical changes of reported recent left robotic renal cyst decortication, with left perinephric stranding and multifocal gas. Small amount of free air is noted and likely postoperative. Surgical drain extending to the left lateral perinephric region, with no abdominal or pelvic fluid collection.   2. Redemonstration of marked bilateral renal enlargement with innumerable cysts, consistent with autosomal dominant polycystic kidney disease. 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   3. Bladder is decompressed about a Foley catheter, limiting evaluation.   4. Colonic diverticulosis without acute diverticulitis or bowel obstruction.   5. Trace pelvic free fluid, nonspecific though possibly postoperative.      Major preliminary findings including presence of extensive pulmonary emboli with right heart strain was conveyed to Dr. Iannazzo by Dairl messenger at 4:34 PM on 08/25/2024.          Finalized by Norleen Pane, M.D. on 08/25/2024 4:35 PM. Dictated by Norleen Pane, M.D. on 08/25/2024 4:12 PM.      2D + DOPPLER ECHO   Final Result      CHEST SINGLE VIEW   Final Result   FINDINGS/IMPRESSION:        Support Devices: Endotracheal tube has been slightly advanced, with the tip now projecting 3 cm above the carina. This could be slightly advanced for optimal positioning.  Lungs/Pleura: Inferior aspects of both lungs, particularly the left, are excluded from the field-of-view,. Patchy bibasilar opacities, likely atelectasis. No pneumothorax.      Heart and Mediastinum: The cardiomediastinal silhouette is stable.             Finalized by JOSETTE BEAGLE, M.D. on 08/25/2024 12:48 PM. Dictated by JOSETTE BEAGLE, M.D. on 08/25/2024 12:46 PM.      CHEST SINGLE VIEW   Final Result   FINDINGS/IMPRESSION:        Support Devices: Endotracheal tube in place, with the tip projecting 0.5 cm above the carina. Advancement is recommended..      Lungs/Pleura: Inferior aspect of the left lung, and inferior right costophrenic angle are excluded from the film.SABRA Patchy bibasilar opacities, likely atelectasis. Mild pulmonary edema.. No pneumothorax.SABRA      Heart and Mediastinum: Cardiac silhouette is incompletely visualized, although likely within normal limits.          Finalized by JOSETTE BEAGLE, M.D. on 08/25/2024 12:49 PM. Dictated by JOSETTE BEAGLE, M.D. on 08/25/2024 12:48 PM.           Scheduled Meds:  Continuous Infusions:      PRN and Respiratory Meds:      Malnutrition Details:              Loss of Subcutaneous Fat: No      Muscle Wasting: No                  Active Wounds                                              [1]   Social History  Socioeconomic History    Marital status: Divorced   Tobacco Use    Smoking status: Never    Smokeless tobacco: Former     Types: Chew     Quit date: 1990    Tobacco comments:     Chewed x10 years   Vaping Use    Vaping status: Never Used   Substance and Sexual Activity    Alcohol use: Yes     Alcohol/week: 2.0 standard drinks of alcohol     Types: 2 Cans of beer per week     Comment: Occasional    Drug use: Never   [2] No current facility-administered medications for this encounter.    Current Outpatient Medications:     acetaminophen  (TYLENOL  EXTRA STRENGTH) 500 mg tablet, Take two tablets by mouth every 6 hours as needed for Pain. Max of 4,000 mg of acetaminophen  in 24 hours.  Indications: pain, Disp: , Rfl:     allopurinoL  (ZYLOPRIM ) 100 mg tablet, TAKE ONE TABLET BY MOUTH DAILY. TAKE WITH FOOD., Disp: 90 tablet, Rfl: 1    apixaban  (ELIQUIS ) 5 mg tablet, Take one tablet by mouth twice daily. Indications: a clot in the lung, Disp: , Rfl:     ascorbic acid 1,000 mg tablet, Take one tablet by mouth daily., Disp: , Rfl:     aspirin  81 mg chewable tablet, Chew one tablet by mouth daily. Take with food., Disp: , Rfl:     atorvastatin  (LIPITOR) 20 mg tablet, Take one-half tablet by mouth daily., Disp: , Rfl:     calcium  carbonate (CALCIUM  600 PO), Take 1 tablet by mouth daily., Disp: , Rfl:     cetirizine  (ZYRTEC ) 10 mg tablet, Take one  tablet by mouth every morning. (Alternates between Claritin , Zyrtec , and Allegra to avoid tolerance buildup), Disp: , Rfl:     CHOLEcalciferoL  (vitamin D3) 1,000 units tablet, Take one tablet by mouth daily., Disp: , Rfl:     colchicine  0.6 mg tablet, Take one tablet by mouth as Needed (for gout flares)., Disp: , Rfl:     fexofenadine (ALLEGRA) 180 mg tablet, Take one tablet by mouth daily. (Alternates between Claritin , Zyrtec , and Allegra to avoid tolerance buildup), Disp: , Rfl:     Flaxseed Oil 1,000 mg cap, Take one capsule by mouth daily., Disp: , Rfl:     [Paused] hydroCHLOROthiazide  (HYDRODIURIL ) 25 mg tablet, Take one tablet by mouth every morning., Disp: 90 tablet, Rfl: 2    JYNARQUE  60 mg (AM)/ 30 mg (PM) TbSQ, Take sixty mg by mouth every morning AND thirty mg every evening., Disp: 56 tablet, Rfl: 11    lisinopriL  (ZESTRIL ) 10 mg tablet, Take one tablet by mouth daily. Indications: high blood pressure, Disp: 30 tablet, Rfl: 0    loratadine  (CLARITIN ) 10 mg tablet, Take one tablet by mouth every morning. (Alternates between Claritin , Zyrtec , and Allegra to avoid tolerance buildup), Disp: , Rfl:     omeprazole DR (PRILOSEC) 40 mg capsule, Take one capsule by mouth daily., Disp: , Rfl:     oxyCODONE  (ROXICODONE ) 5 mg tablet, Take one tablet by mouth every 6 hours as needed. Indications: pain, Disp: , Rfl:     polyethylene glycol 3350  (MIRALAX ) 17 gram/dose powder, Take seventeen g by mouth daily. Indications: constipation, Disp: , Rfl:     predniSONE  (DELTASONE ) 20 mg tablet, Take one tablet by mouth as Needed (with Colchicine  for gout flares)., Disp: , Rfl:     sennosides-docusate sodium  (SENOKOT-S) 8.6/50 mg tablet, Take one tablet by mouth twice daily. Indications: constipation, Disp: , Rfl:     simethicone  (MYLICON) 80 mg chew tablet, Chew one tablet by mouth every 6 hours as needed for Flatulence. Indications: gas, Disp: , Rfl:     sodium bicarbonate  650 mg tablet, Take two tablets by mouth twice daily. Indications: renal tubular acidosis, Disp: , Rfl:     tamsulosin  (FLOMAX ) 0.4 mg capsule, Take one capsule by mouth daily after breakfast. Do not crush, chew or open capsules. Take 30 minutes following the same meal each day.  Indications: urinary retention, Disp: , Rfl:     vitamin E 400 unit capsule, Take one capsule by mouth daily., Disp: , Rfl:     vitamins, B complex tab, Take two tablets by mouth daily., Disp: , Rfl:

## 2024-09-11 LAB — CERULOPLASMIN: ~~LOC~~ BKR CERULOPLASMIN: 51 mg/dL (ref 20.0–60.0)

## 2024-09-11 NOTE — Discharge Summary [5]
 Discharge Summary      Name: Matthew Paul  Medical Record Number: [id number]        Account Number:  [id number]  Date Of Birth:  12-26-1965                         Age:  58 y.o.  Admit date:  08/24/2024                     Discharge date: 09/10/2024      Discharge Attending:  Dr. 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]  Acquired bilateral renal cysts [N28.1]    Primary Discharge Diagnosis:   Acquired bilateral renal cysts    Hospital Diagnoses:  Hospital Problems        Active Problems    * (Principal) Acquired bilateral renal cysts    ADPKD (autosomal dominant polycystic kidney disease)    Cardiac arrest (CMS-HCC)    Acute kidney injury superimposed on CKD    Obstructive cardiovascular shock (CMS-HCC)    Lactic acidosis    High anion gap metabolic acidosis    Acute pulmonary embolism with acute cor pulmonale (CMS-HCC)    Acute respiratory failure with hypoxia (CMS-HCC)     Present on Admission:   ADPKD (autosomal dominant polycystic kidney disease)   Acquired bilateral renal cysts        Significant Past Medical History        Acid reflux  ADPKD (autosomal dominant polycystic kidney)  Apnea  Gout  Hyperlipidemia  Hypertension  Mild shortness of breath  Paroxysmal A-fib (CMS-HCC)    Allergies   Seasonal allergies    Brief Hospital Course     [name] is a 58 year old male with autosomal dominant polycystic kidney disease (ADPKD), chronic kidney disease (CKD), hypertension, hyperlipidemia, and remote atrial fibrillation, admitted for elective left robotic renal cyst decortication. His hospitalization was complicated by massive bilateral pulmonary embolism with cardiac arrest, obstructive shock, acute respiratory failure, acute kidney injury on CKD, and hospital-acquired pneumonia. He required prolonged ICU care, mechanical ventilation, vasopressor support, and management for dysphagia, debility, and transaminitis. He was medically stable for discharge to inpatient rehabilitation on 09/10/2024, with ongoing needs for anticoagulation, renal monitoring, and rehabilitation.    ---    **Acute Pulmonary Embolism with Acute Cor Pulmonale, Cardiac Arrest, and Obstructive Cardiovascular Shock**  He developed a witnessed PEA cardiac arrest on 11/15, with ROSC after 2 rounds of epinephrine  and 4-6 minutes of CPR. Imaging revealed massive bilateral pulmonary emboli with right heart strain and RV dilation. He required mechanical ventilation, norepinephrine , and vasopressin  for shock. Therapeutic anticoagulation was initiated with heparin , transitioned to apixaban . Systemic thrombolysis was deferred due to distal nature of clots. Cardiac function on echocardiogram showed preserved EF and significant RV dilation. No further cardiac arrest occurred. He remained on apixaban , with plans for at least 6 months of anticoagulation. He experienced brief episodes of ventricular bigeminy, managed with IV magnesium , and was monitored for arrhythmia recurrence. He was medically stable for discharge to inpatient rehabilitation.    **Acute Respiratory Failure with Hypoxia**  Following cardiac arrest and PE, he required intubation and high ventilator settings (PEEP up to 10, FiO2 up to 100%). He remained intubated for several days due to hypoxemia and thick secretions, with gradual improvement. Pulmonary hygiene included chest  physiotherapy, duonebs, and hypertonic saline nebulizers. He was extubated on 11/21 and transitioned to nasal cannula, then room air. Incentive spirometry was recommended for ongoing pulmonary care. No further respiratory distress was noted at discharge.    **Hospital-Acquired Pneumonia**  On 11/17, he developed fever and new left lower lobe consolidation on chest imaging, with MRSA nares positive. Empiric vancomycin  and piperacillin -tazobactam were initiated, with a 7-day course completed on 11/24. Sputum cultures showed normal oropharyngeal flora. Leukocytosis persisted but was attributed to steroids and reactive changes. No bacteremia or urinary tract infection was identified. He remained afebrile after completion of antibiotics.    **Acute Kidney Injury Superimposed on CKD, High Anion Gap Metabolic Acidosis, and Lactic Acidosis**  His baseline creatinine was 2.5-2.8 mg/dL, which increased post-arrest, peaking at 3.69 mg/dL, then gradually improved to baseline. Renal function remained impaired but stable, with adequate urine output and no need for renal replacement therapy. He developed mild high anion gap metabolic acidosis, managed with sodium bicarbonate . Diuresis was performed for volume overload, and nephrotoxic agents were avoided. Lisinopril  was held during AKI and hypotension, restarted at discharge at 10 mg daily. Hydrochlorothiazide  was held due to hypotension and not restarted at discharge. Foley catheter was maintained due to urinary retention, with flomax  initiated and urology follow-up recommended for neurogenic bladder assessment post-discharge.    **Transaminitis and Cholestatic Liver Injury**  He developed worsening transaminitis and cholestatic liver enzyme elevation beginning 11/22, with a mixed hepatocellular/cholestatic pattern and elevated GGT. Liver ultrasound showed polycystic liver disease without biliary obstruction. Hepatitis panel was nonreactive, and autoimmune and metabolic workup was initiated. Hepatology was consulted, and liver enzymes began to downtrend. Atorvastatin  and acetaminophen  were held during transaminitis. No liver biopsy was planned at discharge. Elevated enzymes were suspetced to be delayed reaction to shock.    **ADPKD**  He underwent elective left robotic renal cyst decortication and cystoscopy with ureteral stent placement on 11/14 for symptomatic ADPKD. Approximately 100 cysts were decorticated, with 19 sent for pathology. No surgical complications or urine leak were noted. JP drain was removed on 11/23.     **Normocytic Anemia, Thrombocytopenia, and DVT**  He developed normocytic anemia and mild thrombocytopenia, with no evidence of active bleeding. Doppler ultrasound revealed a nonocclusive thrombus in the left popliteal vein, managed with systemic anticoagulation. Hemoglobin and platelets were monitored, with no transfusion required. Leukocytosis was attributed to reactive changes and steroid use.    **Gout**  He had persistent gout pain in bilateral ankles and feet, managed with allopurinol , prednisone , and a short course of colchicine . Pain improved with colchicine .    **Debility and Functional Impairment**  He experienced significant debility and impaired mobility due to critical illness, deconditioning, and proximal muscle weakness. He required maximal assistance for bed mobility and transfers, and moderate assistance for standing and gait. Physical and occupational therapy recommended inpatient rehabilitation for continued therapy. He was accepted to [facility] for post-acute care, with insurance authorization and transportation arranged for 09/10/2024.    **Other Notable Events**  He was medically stable for discharge to inpatient rehabilitation, with ongoing needs for anticoagulation, renal monitoring, and rehabilitation. Discharge planning included arrangements for wheelchair fleeta transport and support from family.    **Medication Changes**  Lisinopril  and hydrochlorothiazide  were held during AKI and hypotension, with plans to restart lisinopril  reduced dose at discharge and not restart hydrochlorothiazide . Atorvastatin  and acetaminophen  were held during transaminitis. Colchicine  was completed for gout flare. Sodium bicarbonate  was started and titrated for metabolic acidosis. Flomax  was started for  urinary retention. He is also discharged on apixaban  for at least the next 6 months.    **Disposition**  He was accepted for transfer to [facility] for ongoing therapy and medical management, with discharge on 09/10/2024.    Day of discharge exam notable for: As noted in day of discharge progress note    Items Needing Follow Up   Pending items or areas that need to be addressed at follow up: as noted below.    Pending Labs and Follow Up Radiology    Pending labs and/or radiology review at this time of discharge are listed below: Please note- any labs with collected status will not have a result; if this area is blank, there are no items for review.   Pending Labs       Order Current Status    ALPHA 1 ANTITRYPSIN PHENOTYPE In process    F-ACTIN (SMOOTH MUSCLE) ANTIBODY, IGG BY ELISA W/ REFLEX TO SMA, IGG TITER (FAAB) In process    MITOCHONDRIAL M2 ANTIBODY, IGG ELISA (AMAR) In process              Medications        Medication List        PAUSE taking these medications      hydroCHLOROthiazide  25 mg tablet  Wait to take this until your doctor or other care provider tells you to start again.  Commonly known as: HYDRODIURIL   Dose: 25 mg  Take one tablet by mouth every morning.  Quantity: 90 tablet  Refills: 2            START taking these medications      apixaban  5 mg tablet  Commonly known as: ELIQUIS   Dose: 5 mg  Take one tablet by mouth twice daily. Indications: a clot in the lung  For: a clot in the lung  Refills: 0     oxyCODONE  5 mg tablet  Commonly known as: ROXICODONE   Dose: 5 mg  Take one tablet by mouth every 6 hours as needed. Indications: pain  For: pain  Refills: 0     polyethylene glycol 3350  17 gram/dose powder  Commonly known as: MIRALAX   Dose: 17 g  Take seventeen g by mouth daily. Indications: constipation  For: constipation  Refills: 0  Replaces: polyethylene glycol 3350  17 g packet     sennosides-docusate sodium  8.6/50 mg tablet  Commonly known as: SENOKOT-S  Dose: 1 tablet  Take one tablet by mouth twice daily. Indications: constipation  For: constipation  Refills: 0     simethicone  80 mg chew tablet  Commonly known as: MYLICON  Dose: 80 mg  Chew one tablet by mouth every 6 hours as needed for Flatulence. Indications: gas  For: gas  Refills: 0     sodium bicarbonate  650 mg tablet  Dose: 1,300 mg  Take two tablets by mouth twice daily. Indications: renal tubular acidosis  For: renal tubular acidosis  Refills: 0     tamsulosin  0.4 mg capsule  Commonly known as: FLOMAX   Dose: 0.4 mg  Take one capsule by mouth daily after breakfast. Do not crush, chew or open capsules. Take 30 minutes following the same meal each day.  Indications: urinary retention  For: urinary retention  Refills: 0            CHANGE how you take these medications      acetaminophen  500 mg tablet  Commonly known as: TYLENOL  EXTRA STRENGTH  Dose: 1,000 mg  Take  two tablets by mouth every 6 hours as needed for Pain. Max of 4,000 mg of acetaminophen  in 24 hours.  Indications: pain  For: pain  Refills: 0  What changed:   medication strength  how much to take  when to take this  reasons to take this  additional instructions     lisinopriL  10 mg tablet  Commonly known as: ZESTRIL   Dose: 10 mg  Take one tablet by mouth daily. Indications: high blood pressure  For: high blood pressure  Quantity: 30 tablet  Refills: 0  What changed:   medication strength  how much to take            CONTINUE taking these medications      allopurinoL  100 mg tablet  Commonly known as: ZYLOPRIM   Dose: 100 mg  TAKE ONE TABLET BY MOUTH DAILY. TAKE WITH FOOD.  Quantity: 90 tablet  Refills: 1     ascorbic acid (vitamin C) 1,000 mg tablet  Dose: 1 tablet  Take one tablet by mouth daily.  Refills: 0     aspirin  81 mg chewable tablet  Dose: 81 mg  Chew one tablet by mouth daily. Take with food.  Refills: 0     atorvastatin  20 mg tablet  Commonly known as: LIPITOR  Dose: 10 mg  Take one-half tablet by mouth daily.  Refills: 0     CALCIUM  600 PO  Dose: 1 tablet  Take 1 tablet by mouth daily.  Refills: 0 cetirizine  10 mg tablet  Commonly known as: ZyrTEC   Dose: 10 mg  Take one tablet by mouth every morning. (Alternates between Claritin , Zyrtec , and Allegra to avoid tolerance buildup)  Refills: 0     CHOLEcalciferoL  (vitamin D3) 1,000 units tablet  Dose: 1,000 Units  Take one tablet by mouth daily.  Refills: 0     colchicine  0.6 mg tablet  Commonly known as: COLCRYS   Dose: 0.6 mg  Take one tablet by mouth as Needed (for gout flares).  Refills: 0     fexofenadine 180 mg tablet  Commonly known as: ALLEGRA  Dose: 180 mg  Take one tablet by mouth daily. (Alternates between Claritin , Zyrtec , and Allegra to avoid tolerance buildup)  Refills: 0     Flaxseed Oil 1,000 mg Cap  Dose: 1 capsule  Take one capsule by mouth daily.  Refills: 0     JYNARQUE  60 mg (AM)/ 30 mg (PM) Tbsq  Generic drug: tolvaptan  (polycys kidney dis)  Take sixty mg by mouth every morning AND thirty mg every evening.  Quantity: 56 tablet  Refills: 11     loratadine  10 mg tablet  Commonly known as: CLARITIN   Dose: 10 mg  Take one tablet by mouth every morning. (Alternates between Claritin , Zyrtec , and Allegra to avoid tolerance buildup)  Refills: 0     omeprazole DR 40 mg capsule  Commonly known as: PriLOSEC  Dose: 40 mg  Take one capsule by mouth daily.  Refills: 0     predniSONE  20 mg tablet  Commonly known as: DELTASONE   Dose: 20 mg  Take one tablet by mouth as Needed (with Colchicine  for gout flares).  Refills: 0     vitamin E 400 unit capsule  Dose: 400 Units  Take one capsule by mouth daily.  Refills: 0     vitamins, B complex tablet  Dose: 2 tablet  Take two tablets by mouth daily.  Refills: 0            STOP taking  these medications      polyethylene glycol 3350  17 g packet  Commonly known as: MIRALAX   Replaced by: polyethylene glycol 3350  17 gram/dose powder               Where to Get Your Medications        You can get these medications from any pharmacy    You don't need a prescription for these medications  acetaminophen  500 mg tablet Information about where to get these medications is not yet available    Ask your nurse or doctor about these medications  apixaban  5 mg tablet  lisinopriL  10 mg tablet  oxyCODONE  5 mg tablet  polyethylene glycol 3350  17 gram/dose powder  sennosides-docusate sodium  8.6/50 mg tablet  simethicone  80 mg chew tablet  sodium bicarbonate  650 mg tablet  tamsulosin  0.4 mg capsule         Return Appointments and Scheduled Appointments     Scheduled appointments:      Sep 20, 2024 2:30 PM  Office visit with Alm DELENA Dec, MD  Urology: [facility] (Urology) [address]  [address], [address]  [place]  [place] [address]  [phone]     Jan 15, 2025 1:20 PM  Office visit with Kirke DELENA Hausen, MD  Nephrology: [facility] (Internal Medicine) [address]  [address]  [place]  [place] [address]  [phone]          Things you need to do       Follow up with Georgina Mulch, DO    Phone: [phone]    Where: [phone], [place]  [place] [phone]          Contact information for after-discharge care                [facility]       [place]  [place]    Phone: [phone]    Fax: [phone]    Where: [address], [place]  [place] [address]    Service: Inpatient Rehabilitation                  Consults, Procedures, Diagnostics, Micro, Pathology   Consults: Hepatology, Nephrology, and Urology  Surgical Procedures & Dates: As noted in DC summary  Significant Diagnostic Studies, Micro and Procedures: noted in brief hospital course  Significant Pathology: noted in brief hospital course                       Discharge Disposition, Condition   Patient Disposition: Rehab Facility (Not [place]) [62]  Condition at Discharge: Stable    Code Status   Prior    Patient Instructions     Activity       Activity as Tolerated   As directed      It is important to keep increasing your activity level after you leave the hospital.  Moving around can help prevent blood clots, lung infection (pneumonia) and other problems.  Gradually increasing the number of times you are up moving around will help you return to your normal activity level more quickly.  Continue to increase the number of times you are up to the chair and walking daily to return to your normal activity level. Begin to work toward your normal activity level at discharge    Driving Restrictions   As directed      No driving while taking pain medication.    Strenuous Activity Restrictions   As directed  Please refrain from strenuous activity for 6 week(s). No lifting more than 10 pounds for 6 weeks.          Diet       Regular Diet   As directed      Please continue with a healthy balanced diet. Avoid high salt and processed foods. Stay hydrated. Drink 8-12 cups of hydrating fluids daily unless you've been told to limit your fluids for medical reasons.    Regular Diet   As directed      You have no dietary restriction. Please continue with a healthy balanced diet.          Wounds/Lines/Drains       Incision Care   As directed      *Keep your incision clean and dry.  *Shower daily. Wash your incisions with soap and water .   *Do not submerge incision in tub, pool, hot tub, or lake for 4 weeks.  *Usually there are not stitches to be removed. Steri-strips (strips of tape) will begin to fall off in 10-14 days. If they remain after 2 weeks, gently remove them when they are damp after a shower.  *Your incision should gradually look better each day. If you notice unusual swelling, redness, drainage, have increasing pain at the site, or have a fever greater than 100 degrees, notify your physician immediately.             Discharge education provided to patient.    Additional Orders: Case Management, Supplies, Home Health     Home Health/DME       None                Signed:  Alois Hamlet, MD  09/11/2024      cc:  Primary Care Physician:  Georgina Mulch   Verified    Referring physicians:  Self, Referral   Additional provider(s):        Did we miss something? If additional records are needed, please fax a request on office letterhead to [phone]. Please include the patient's name, date of birth, fax number and type of information needed. Additional request can be made by email at [email or web address]. For general questions of information about electronic records sharing, call [phone].

## 2024-09-12 LAB — MITOCHONDRIAL M2 ANTIBODY, IGG ELISA (AMAR): ~~LOC~~ BKR MITOCHON M2 AB IGG: 4.1 U (ref 0.0–24.9)

## 2024-09-12 LAB — F-ACTIN (SMOOTH MUSCLE) ANTIBODY, IGG BY ELISA W/ REFLEX TO SMA, IGG TITER (FAAB): ~~LOC~~ BKR F-ACTIN AB W/REFLEX: 7 U (ref 0–19)

## 2024-09-13 LAB — ALPHA 1 ANTITRYPSIN PHENOTYPE: ~~LOC~~ BKR ALPHA 1 ANTITRYPSIN: 203 mg/dL — ABNORMAL HIGH (ref 90–200)

## 2024-09-16 ENCOUNTER — Encounter: Admit: 2024-09-16 | Discharge: 2024-09-16 | Payer: BLUE CROSS/BLUE SHIELD

## 2024-10-05 ENCOUNTER — Encounter: Admit: 2024-10-05 | Discharge: 2024-10-05 | Payer: BLUE CROSS/BLUE SHIELD

## 2024-10-24 ENCOUNTER — Encounter: Admit: 2024-10-24 | Discharge: 2024-10-24 | Payer: BLUE CROSS/BLUE SHIELD

## 2024-10-24 MED ORDER — HYDROCHLOROTHIAZIDE 25 MG PO TAB
25 mg | ORAL_TABLET | Freq: Every morning | ORAL | 2 refills | 28.00000 days | Status: AC
Start: 2024-10-24 — End: ?

## 2024-10-24 NOTE — Telephone Encounter [36]
 Rec'd VM from pt, he is needing a new script for his HCTZ, reports pharmacy told him that the refill request was denied? Never rec'd request at this office.  Per review of chart, HCTZ was placed on hold on discharge from hospital.  Per Dr. Suzann, OK for pt to restart/continue to take.    Attempted to contact pt to notify, LVM that script has been sent to his pharmacy.

## 2024-11-01 ENCOUNTER — Ambulatory Visit: Admit: 2024-11-01 | Discharge: 2024-11-02 | Payer: BLUE CROSS/BLUE SHIELD

## 2024-11-01 ENCOUNTER — Encounter: Admit: 2024-11-01 | Discharge: 2024-11-01 | Payer: BLUE CROSS/BLUE SHIELD

## 2024-11-02 ENCOUNTER — Encounter: Admit: 2024-11-02 | Discharge: 2024-11-02 | Payer: BLUE CROSS/BLUE SHIELD
